# Patient Record
Sex: Female | Born: 1965 | Race: White | Hispanic: No | Marital: Married | Smoking: Never smoker
Health system: Southern US, Community
[De-identification: ages and names within clinical notes are randomized; demographics above are authoritative.]

## PROBLEM LIST (undated history)

## (undated) DIAGNOSIS — Z90710 Acquired absence of both cervix and uterus: Secondary | ICD-10-CM

## (undated) DIAGNOSIS — E039 Hypothyroidism, unspecified: Secondary | ICD-10-CM

## (undated) DIAGNOSIS — F419 Anxiety disorder, unspecified: Secondary | ICD-10-CM

## (undated) DIAGNOSIS — M3500 Sicca syndrome, unspecified: Secondary | ICD-10-CM

## (undated) DIAGNOSIS — M1990 Unspecified osteoarthritis, unspecified site: Secondary | ICD-10-CM

## (undated) HISTORY — DX: Unspecified osteoarthritis, unspecified site: M19.90

## (undated) HISTORY — DX: Anxiety disorder, unspecified: F41.9

## (undated) HISTORY — DX: Hypothyroidism, unspecified: E03.9

## (undated) HISTORY — DX: Sjogren syndrome, unspecified: M35.00

## (undated) HISTORY — PX: ABDOMINAL HYSTERECTOMY: SHX81

## (undated) HISTORY — PX: EYE SURGERY: SHX253

## (undated) HISTORY — DX: Acquired absence of both cervix and uterus: Z90.710

---

## 2015-01-18 ENCOUNTER — Emergency Department
Admission: EM | Admit: 2015-01-18 | Discharge: 2015-01-18 | Disposition: A | Discharge status: Home / Self Care | Payer: BC Managed Care – PPO | Source: Home / Self Care | Attending: Family Medicine | Admitting: Family Medicine

## 2015-01-18 ENCOUNTER — Emergency Department (INDEPENDENT_AMBULATORY_CARE_PROVIDER_SITE_OTHER): Payer: BC Managed Care – PPO

## 2015-01-18 ENCOUNTER — Encounter: Payer: Self-pay | Admitting: *Deleted

## 2015-01-18 DIAGNOSIS — G8929 Other chronic pain: Secondary | ICD-10-CM

## 2015-01-18 DIAGNOSIS — R079 Chest pain, unspecified: Secondary | ICD-10-CM

## 2015-01-18 DIAGNOSIS — R1011 Right upper quadrant pain: Secondary | ICD-10-CM

## 2015-01-18 DIAGNOSIS — M549 Dorsalgia, unspecified: Secondary | ICD-10-CM

## 2015-01-18 LAB — COMPLETE METABOLIC PANEL WITH GFR
ALT: 21 U/L (ref 6–29)
AST: 18 U/L (ref 10–35)
Albumin: 4.2 g/dL (ref 3.6–5.1)
Alkaline Phosphatase: 48 U/L (ref 33–115)
BUN: 8 mg/dL (ref 7–25)
CO2: 26 mmol/L (ref 20–31)
Calcium: 8.9 mg/dL (ref 8.6–10.2)
Chloride: 105 mmol/L (ref 98–110)
Creat: 0.9 mg/dL (ref 0.50–1.10)
GFR, Est African American: 88 mL/min (ref >=60)
GFR, Est Non African American: 76 mL/min (ref >=60)
Glucose, Bld: 106 mg/dL — ABNORMAL HIGH (ref 65–99)
Potassium: 3.7 mmol/L (ref 3.5–5.3)
Sodium: 142 mmol/L (ref 135–146)
Total Bilirubin: 0.5 mg/dL (ref 0.2–1.2)
Total Protein: 7.6 g/dL (ref 6.1–8.1)

## 2015-01-18 LAB — TSH: TSH: 18.16 u[IU]/mL — ABNORMAL HIGH (ref 0.350–4.500)

## 2015-01-18 NOTE — ED Provider Notes (Signed)
CSN: [id number]     Arrival date & time 01/18/15  1015 History   First MD Initiated Contact with Patient 01/18/15 1029     Chief Complaint  Patient presents with  . 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  Pt describes pain as aching and sore, mild to moderate at times but denies pain at this time.  Pt also notes she had a "tight" pain in her Right upper abdomen that lasted about 45 minutes the other day.  Pt notes she had this RUQ pain and upper chest/shoulder pain when she was startled by a car alarm going off. Symptoms do not appear to be better or worse with eating.  Denies shortness of breath.  Pt states walking to and from her car does not cause fatigue but if she spends the day doing chores at home she feels "worn out."  Denies personal or family hx of CAD. Denies hx of blood clots, leg pain or leg swelling.  She takes aspirin occasionally for the back and chest pain, which does help, but denies taking any daily medications. She does not have a PCP.    History reviewed. No pertinent past medical history. Past Surgical History  Procedure Laterality Date  . Eye surgery     Family History  Problem Relation Age of Onset  . Diabetes Father   . Cancer Father     prostate CA   Social History  Substance Use Topics  . Smoking status: Never Smoker   . Smokeless tobacco: Never Used  . Alcohol Use: Yes   OB History    No data available     Review of Systems  Constitutional: Positive for fatigue. Negative for fever, chills and diaphoresis.  HENT: Negative for congestion, ear pain, sore throat, trouble swallowing and voice change.   Respiratory: Negative for cough and shortness of breath.   Cardiovascular: Positive for chest pain. Negative for  palpitations.  Gastrointestinal: Positive for abdominal pain (RUQ). Negative for nausea, vomiting and diarrhea.  Musculoskeletal: Positive for myalgias and back pain ( upper, bilateral). Negative for arthralgias.  Skin: Negative for rash.  Neurological: Positive for light-headedness. Negative for headaches.  All other systems reviewed and are negative.   Allergies  Review of patient's allergies indicates no known allergies.  Home Medications   Prior to Admission medications   Not on File   Meds Ordered and Administered this Visit  Medications - No data to display  BP 154/90 mmHg  Pulse 99  Resp 16  Wt 186 lb (84.369 kg)  SpO2 99%  LMP 01/02/2015 No data found.   Physical Exam  Constitutional: She appears well-developed and well-nourished. No distress.  HENT:  Head: Normocephalic and atraumatic.  Eyes: Conjunctivae are normal. No scleral icterus.  Neck: Normal range of motion. Neck supple.  Cardiovascular: Normal rate, regular rhythm and normal heart sounds.   Pulmonary/Chest: Effort normal and breath sounds normal. No respiratory distress. She has no wheezes. She has no rales. She exhibits no tenderness.  Abdominal: Soft. She exhibits no distension and no mass. There is no tenderness. There is no rebound and no guarding.  Musculoskeletal: Normal range of motion.  Neurological: She is alert.  Skin: Skin is warm and dry. She is not diaphoretic.  Nursing note and vitals reviewed.  ED Course  Procedures (including critical care time)  Labs Review Labs Reviewed  COMPLETE METABOLIC PANEL WITH GFR  TSH  POCT CBC W AUTO DIFF ([facility])    Imaging Review Dg Chest 2 View  01/18/2015  CLINICAL DATA:  Upper chest pain. EXAM: CHEST  2 VIEW COMPARISON:  None. FINDINGS: Normal heart size. Normal mediastinal contour. No pneumothorax. No pleural effusion. Clear lungs, with no focal lung consolidation and no pulmonary edema. Mild degenerative changes in the thoracic  spine. IMPRESSION: No active disease in the chest. Electronically Signed   By: Ilona Sorrel M.D.   On: 01/18/2015 11:51   EKG: RSR (V1) non-diagnostic, otherwise normal. No old to compare.    MDM   1. Chest pain, unspecified chest pain type   2. Upper back pain   3. Abdominal pain, chronic, right upper quadrant     Pt c/o intermittent upper chest pain, back pain, and RUQ abdominal pain for 6 weeks. No pain or SOB at this time.  Pain not reproducible on exam.  Pt appears well, non-toxic, Vitals: WNL. O2 Sat 99%   CXR: normal Pt denies FH or personal hx of CAD.  Doubt ACS at this time, however, recommend pt establish care with a PCP as she does not currently have one.  Due to c/o RUQ pain as well as symptom onset the other day after being startled by a car alarm, will get CMP to check LFTs as well as TSH as symptoms may be due to anxiety.   Discussed symptoms that warrant emergent care in the ED. Patient verbalized understanding and agreement with treatment plan.    Noland Fordyce, PA-C 01/18/15 1322

## 2015-01-18 NOTE — ED Notes (Signed)
Pt c/o 6 weeks of intermittent CP that sometimes moves to her back. No current pain. 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

## 2015-01-19 ENCOUNTER — Telehealth: Payer: Self-pay | Admitting: *Deleted

## 2015-01-26 ENCOUNTER — Ambulatory Visit (INDEPENDENT_AMBULATORY_CARE_PROVIDER_SITE_OTHER): Payer: BC Managed Care – PPO | Admitting: Physician Assistant

## 2015-01-26 ENCOUNTER — Encounter: Payer: Self-pay | Admitting: Physician Assistant

## 2015-01-26 VITALS — BP 149/62 | HR 85 | Ht 64.5 in | Wt 186.0 lb

## 2015-01-26 DIAGNOSIS — Z13220 Encounter for screening for lipoid disorders: Secondary | ICD-10-CM

## 2015-01-26 DIAGNOSIS — F411 Generalized anxiety disorder: Secondary | ICD-10-CM | POA: Diagnosis not present

## 2015-01-26 DIAGNOSIS — R030 Elevated blood-pressure reading, without diagnosis of hypertension: Secondary | ICD-10-CM | POA: Diagnosis not present

## 2015-01-26 DIAGNOSIS — E039 Hypothyroidism, unspecified: Secondary | ICD-10-CM | POA: Diagnosis not present

## 2015-01-26 DIAGNOSIS — IMO0001 Reserved for inherently not codable concepts without codable children: Secondary | ICD-10-CM

## 2015-01-26 DIAGNOSIS — R0789 Other chest pain: Secondary | ICD-10-CM

## 2015-01-26 MED ORDER — LEVOTHYROXINE SODIUM 100 MCG PO TABS: 100.0000 ug | ORAL_TABLET | Freq: Every day | ORAL | Status: DC

## 2015-01-26 NOTE — Progress Notes (Signed)
   Subjective:    Patient ID: Heidi Carney, female    DOB: [birth year], 49 y.o.   MRN: [id number]  HPI Patient is a 49 year old female who presents to the clinic to establish care. She was recently seen in urgent care(01/17/14) for atypical chest pain. Her thyroid levels were checked and were elevated at greater than 18. EKG was normal. They did not suspect cardiac causes. She does feel like some of her symptoms worsen with exertion. She has a lot of upper chest tightness that radiates into her left mid back. She denies any musculoskeletal injury or trauma. She denies any association with food. She denies any nausea or vomiting. She has had some occasional indigestion but not related to her chest pain. She also mentions increased anxiety at work. She has never taken any medication for anxiety. She denies any suicidal or homicidal thoughts. She feels like she has worse agitation that actual anxiety. She does feel like taking aspirin makes her chest symptoms improve. She denies any shortness of breath.   .. Family History  Problem Relation Age of Onset  . Diabetes Father   . Cancer Father     prostate CA  . Stroke Father    .Marland Kitchen Social History   Social History  . Marital Status: Married    Spouse Name: N/A  . Number of Children: N/A  . Years of Education: N/A   Occupational History  . Not on file.   Social History Main Topics  . Smoking status: Never Smoker   . Smokeless tobacco: Never Used  . Alcohol Use: Yes  . Drug Use: No  . Sexual Activity: Yes   Other Topics Concern  . Not on file   Social History Narrative      Review of Systems  All other systems reviewed and are negative.      Objective:   Physical Exam  Constitutional: She is oriented to person, place, and time. She appears well-developed and well-nourished.  HENT:  Head: Normocephalic and atraumatic.  Cardiovascular: Normal rate, regular rhythm and normal heart sounds.   Pulmonary/Chest: Effort normal and  breath sounds normal.  Neurological: She is alert and oriented to person, place, and time.  Psychiatric: She has a normal mood and affect. Her behavior is normal.          Assessment & Plan:  Atypical chest pain- cardiac workup at urgent care was normal. EKG looked great. Will further evaluate with stress tests. Continue on daily ASA.   Hypothyroidism-started levothyroxin 100 g discussed how to properly take 30 minutes before eating and drinking in the morning. We'll recheck levels in 4 weeks.  GAD- GAD was 7(mild anxiety). Discussed could be from hypothyroidism. Will hold on treatment.   Elevated BP- patient does not have a history of hypertension. She reports taking her blood pressure at home and ranging in the 120s over 80s. Will hold off on starting a blood pressure medication today and recheck in 4 weeks. If continues to stay elevated will consider medication.  Will check lipid panel.  Pt declined flu shot today.

## 2015-01-28 ENCOUNTER — Telehealth: Payer: Self-pay | Admitting: Physician Assistant

## 2015-01-28 NOTE — Telephone Encounter (Signed)
Pt called and states she started taking her synthroid Rx and has felt "extremely fatigued." Pt states she had to "stop and sit down multiples times yesterday" and questions if this is "normal." Will route to PCP for review.

## 2015-01-28 NOTE — Telephone Encounter (Signed)
Pt advised of recommendation and verbalized understanding.

## 2015-01-28 NOTE — Telephone Encounter (Signed)
No not a normal reaction. Stop and see if symptoms improve. Once improved cut tablet in half if she can and start there. If cannot cut tablet in half let me know.

## 2015-02-11 ENCOUNTER — Other Ambulatory Visit: Payer: Self-pay | Admitting: Physician Assistant

## 2015-02-11 ENCOUNTER — Ambulatory Visit (INDEPENDENT_AMBULATORY_CARE_PROVIDER_SITE_OTHER): Payer: BC Managed Care – PPO

## 2015-02-11 ENCOUNTER — Encounter: Payer: BC Managed Care – PPO | Admitting: Physician Assistant

## 2015-02-11 DIAGNOSIS — R0789 Other chest pain: Secondary | ICD-10-CM

## 2015-02-11 DIAGNOSIS — R079 Chest pain, unspecified: Secondary | ICD-10-CM

## 2015-02-11 LAB — EXERCISE TOLERANCE TEST
CHL CUP MPHR: 173 {beats}/min
CSEPEW: 7 METS
CSEPHR: 92 %
Exercise duration (min): 6 min
Exercise duration (sec): 0 s
Peak HR: 160 {beats}/min
RPE: 14
Rest HR: 94 {beats}/min

## 2015-02-12 ENCOUNTER — Telehealth: Payer: Self-pay | Admitting: *Deleted

## 2015-02-12 NOTE — Telephone Encounter (Signed)
Pt left vm stating that she had an abnormal result on a stress test yesterday & they are having her come back to do a nuclear stress test.  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  Please advise.

## 2015-02-12 NOTE — Telephone Encounter (Signed)
To write out of work need office visit to document so insurance will pay. Come in Monday and can give note and document reasons why.

## 2015-02-15 NOTE — Telephone Encounter (Signed)
Yes should be able to see in care everywhere.

## 2015-02-15 NOTE — Telephone Encounter (Signed)
Spoke with pt this morning and she ended up going to the ED sat with elbow pain.  They admitted her & are going to do the nuclear stress test today.  She asked if it would be ok for them to draw her tsh levels while she was there so I told her that wouldn't be a problem as long as we got the results.  They may have to send something for you to sign for authorization.

## 2015-02-16 ENCOUNTER — Telehealth (HOSPITAL_COMMUNITY): Payer: Self-pay | Admitting: *Deleted

## 2015-02-17 ENCOUNTER — Encounter: Payer: Self-pay | Admitting: Physician Assistant

## 2015-02-17 ENCOUNTER — Ambulatory Visit (INDEPENDENT_AMBULATORY_CARE_PROVIDER_SITE_OTHER): Payer: BC Managed Care – PPO | Admitting: Physician Assistant

## 2015-02-17 VITALS — BP 130/80 | HR 101 | Ht 64.5 in | Wt 187.0 lb

## 2015-02-17 DIAGNOSIS — E039 Hypothyroidism, unspecified: Secondary | ICD-10-CM

## 2015-02-17 DIAGNOSIS — Z13220 Encounter for screening for lipoid disorders: Secondary | ICD-10-CM | POA: Diagnosis not present

## 2015-02-17 DIAGNOSIS — F411 Generalized anxiety disorder: Secondary | ICD-10-CM

## 2015-02-17 DIAGNOSIS — Z131 Encounter for screening for diabetes mellitus: Secondary | ICD-10-CM

## 2015-02-17 DIAGNOSIS — R0789 Other chest pain: Secondary | ICD-10-CM

## 2015-02-17 DIAGNOSIS — T50905A Adverse effect of unspecified drugs, medicaments and biological substances, initial encounter: Secondary | ICD-10-CM

## 2015-02-17 MED ORDER — CITALOPRAM HYDROBROMIDE 10 MG PO TABS: 10.0000 mg | ORAL_TABLET | Freq: Every day | ORAL | Status: DC

## 2015-02-17 NOTE — Progress Notes (Signed)
   Subjective:    Patient ID: Heidi Carney, female    DOB: Oct 29, 1965, 49 y.o.   MRN: [id number]  HPI  Patient is a 49 year old female who presents to the clinic to follow up after ER visit on 02/14/15. She went with complaints of left arm numbness and pain that radiated to jaw and chest tightness. Exercise stress test was abnormal but followed up with completely normal nuclear stress test. Negative cardiac enzymes. She now feels much better knowing it is not her heart and symptoms have began to improve. She now notices when she starts to feel more anxious or stress she gets similar symptoms. She admits to feeling anxious a lot and little things getting her. Does not feel hopeless or helpless.  She is ready to start a medication. TSH in ED was 9/9. She is taking 20mcg because 14mcg per pt made her feel very sleepy.      Review of Systems  All other systems reviewed and are negative.      Objective:   Physical Exam  Constitutional: She is oriented to person, place, and time. She appears well-developed and well-nourished.  HENT:  Head: Normocephalic and atraumatic.  Cardiovascular: Normal rate, regular rhythm and normal heart sounds.   Pulmonary/Chest: Effort normal and breath sounds normal. She has no wheezes.  Neurological: She is alert and oriented to person, place, and time.  Psychiatric: She has a normal mood and affect. Her behavior is normal.          Assessment & Plan:  ayptical chest pain/elevated BP- reassured with normal nuclear stress test. Will try to manage anxiety and see if CP improves. Needs lipid. Given lab slip to have done. BP elevated on arrival improved with sitting.   GAD- GAD-7 was 18. Will start celexa 10mg  daily. Discussed side effects. Pt aware takes some time to get in system. Follow up in 4 weeks.    Hypothyroidism- looking better at 9.9. Increase to 61mcg and will recheck in 6 weeks.   Written out of work until next Wednesday. Hopefully celexa will  be in system and some of her CP symptoms will improve.

## 2015-02-22 ENCOUNTER — Other Ambulatory Visit: Payer: Self-pay | Admitting: Physician Assistant

## 2015-02-22 ENCOUNTER — Ambulatory Visit: Payer: BC Managed Care – PPO | Admitting: Physician Assistant

## 2015-03-01 ENCOUNTER — Encounter (HOSPITAL_COMMUNITY): Payer: BC Managed Care – PPO

## 2015-03-01 ENCOUNTER — Other Ambulatory Visit: Payer: Self-pay | Admitting: *Deleted

## 2015-03-01 ENCOUNTER — Telehealth: Payer: Self-pay | Admitting: *Deleted

## 2015-03-01 MED ORDER — LEVOTHYROXINE SODIUM 75 MCG PO TABS: 75.0000 ug | ORAL_TABLET | Freq: Every day | ORAL | Status: DC

## 2015-03-01 NOTE — Telephone Encounter (Signed)
58mcg is correct dose. Please make correction and send.

## 2015-03-01 NOTE — Telephone Encounter (Signed)
Pt left vm stating that you were changing her levothyroxine to 64mcg but 12mcg was sent in & filled.  Please advise on the correct dose.

## 2015-03-01 NOTE — Telephone Encounter (Signed)
Levothyroxine 54mcg sent to pharm.  Pt notified.

## 2015-03-10 ENCOUNTER — Ambulatory Visit (INDEPENDENT_AMBULATORY_CARE_PROVIDER_SITE_OTHER): Payer: BC Managed Care – PPO | Admitting: Physician Assistant

## 2015-03-10 ENCOUNTER — Encounter: Payer: Self-pay | Admitting: Physician Assistant

## 2015-03-10 VITALS — BP 143/71 | HR 96 | Ht 64.5 in | Wt 186.0 lb

## 2015-03-10 DIAGNOSIS — L219 Seborrheic dermatitis, unspecified: Secondary | ICD-10-CM

## 2015-03-10 DIAGNOSIS — H6012 Cellulitis of left external ear: Secondary | ICD-10-CM

## 2015-03-10 MED ORDER — DOXYCYCLINE HYCLATE 100 MG PO TABS: 100.0000 mg | ORAL_TABLET | Freq: Two times a day (BID) | ORAL | Status: DC

## 2015-03-10 MED ORDER — TRIAMCINOLONE ACETONIDE 0.5 % EX OINT: 1.0000 "application " | TOPICAL_OINTMENT | Freq: Two times a day (BID) | CUTANEOUS | Status: DC

## 2015-03-10 NOTE — Progress Notes (Signed)
   Subjective:    Patient ID: Heidi Carney, female    DOB: 08-Sep-1965, 49 y.o.   MRN: [id number]  HPI Pt presents to the clinic for red, warm, tender left ear for last 2 days. She remembers scratching the back of her ear because it was itching and scaly. She has done nothing to make better. Nothing makes worse. No fever, chills, n/v/d.    Review of Systems  All other systems reviewed and are negative.      Objective:   Physical Exam  Constitutional: She appears well-developed and well-nourished.  HENT:  Head:            Assessment & Plan:  Cellulitis of left ear- likely bacteria entered through scratch in the crease behind ear. Treated with doxycycline for 10 days. Keep clean and dry. Follow up as needed.   seborrheic dermatitis- triamcinolone cream to use behind ear.

## 2015-03-13 LAB — COMPLETE METABOLIC PANEL WITH GFR
ALT: 22 U/L (ref 6–29)
AST: 21 U/L (ref 10–35)
Albumin: 4.1 g/dL (ref 3.6–5.1)
Alkaline Phosphatase: 53 U/L (ref 33–115)
BILIRUBIN TOTAL: 0.6 mg/dL (ref 0.2–1.2)
BUN: 12 mg/dL (ref 7–25)
CO2: 31 mmol/L (ref 20–31)
CREATININE: 0.9 mg/dL (ref 0.50–1.10)
Calcium: 8.8 mg/dL (ref 8.6–10.2)
Chloride: 102 mmol/L (ref 98–110)
GFR, Est African American: 88 mL/min (ref >=60)
GFR, Est Non African American: 76 mL/min (ref >=60)
GLUCOSE: 90 mg/dL (ref 65–99)
Potassium: 4.1 mmol/L (ref 3.5–5.3)
SODIUM: 139 mmol/L (ref 135–146)
TOTAL PROTEIN: 7.3 g/dL (ref 6.1–8.1)

## 2015-03-13 LAB — LIPID PANEL
Cholesterol: 152 mg/dL (ref 125–200)
HDL: 48 mg/dL (ref >=46)
LDL Cholesterol: 81 mg/dL (ref <130)
Total CHOL/HDL Ratio: 3.2 Ratio (ref <=5.0)
Triglycerides: 117 mg/dL (ref <150)
VLDL: 23 mg/dL (ref <30)

## 2015-03-13 LAB — TSH: TSH: 4.408 u[IU]/mL (ref 0.350–4.500)

## 2015-03-17 ENCOUNTER — Encounter: Payer: Self-pay | Admitting: Physician Assistant

## 2015-03-17 ENCOUNTER — Ambulatory Visit (INDEPENDENT_AMBULATORY_CARE_PROVIDER_SITE_OTHER): Payer: BC Managed Care – PPO | Admitting: Physician Assistant

## 2015-03-17 VITALS — BP 132/82 | HR 97 | Ht 64.5 in | Wt 186.0 lb

## 2015-03-17 DIAGNOSIS — E039 Hypothyroidism, unspecified: Secondary | ICD-10-CM

## 2015-03-17 DIAGNOSIS — L219 Seborrheic dermatitis, unspecified: Secondary | ICD-10-CM | POA: Diagnosis not present

## 2015-03-17 DIAGNOSIS — Z23 Encounter for immunization: Secondary | ICD-10-CM | POA: Diagnosis not present

## 2015-03-17 DIAGNOSIS — R030 Elevated blood-pressure reading, without diagnosis of hypertension: Secondary | ICD-10-CM | POA: Diagnosis not present

## 2015-03-17 DIAGNOSIS — E663 Overweight: Secondary | ICD-10-CM

## 2015-03-17 DIAGNOSIS — IMO0001 Reserved for inherently not codable concepts without codable children: Secondary | ICD-10-CM

## 2015-03-17 DIAGNOSIS — F411 Generalized anxiety disorder: Secondary | ICD-10-CM

## 2015-03-17 NOTE — Progress Notes (Signed)
   Subjective:    Patient ID: Heidi Carney, female    DOB: 30-Jul-1965, 49 y.o.   MRN: [id number]  HPI  Patient is a 49 year old female who presents to the clinic for follow-up.  Hypothyroidism-she would like to discuss her labs and talk about any medication changes. She is feeling much better over the last couple weeks. Her back spasms have decreased, her anxiety has decreased, she has more energy.  Generalized anxiety disorder-she did not like the way she was feeling on Celexa. She does feel overall that her anxiety has improved since starting the levothyroxine. She denies any suicidal or homicidal thoughts.  Seborrheic dermatitis-the rash behind ears and around scalp line has completely resolved with triamcinolone.  Elevated blood pressure- She is not checking her blood pressure at home. She denies any chest pains, palpitations, headaches, dizziness or vision changes. She is currently on no medication to treat.    Review of Systems  All other systems reviewed and are negative.      Objective:   Physical Exam  Constitutional: She is oriented to person, place, and time. She appears well-developed and well-nourished.  HENT:  Head: Normocephalic and atraumatic.  Cardiovascular: Normal rate, regular rhythm and normal heart sounds.   Pulmonary/Chest: Effort normal and breath sounds normal.  Neurological: She is alert and oriented to person, place, and time.  Psychiatric: She has a normal mood and affect. Her behavior is normal.          Assessment & Plan:  Hypothyroidism-last thyroid was checked earlier this month and was 4.4. This is the upper limits of normal. I discussed this with patient. She is concerned because she originally had some side effects from the level thyroxine. She would like to stay on the 75 g a least until she runs out. She will then call and ask for an increase to 53mcg daily. After she is increase for at least 4-6 weeks we will recheck her thyroid level  again and adjust accordingly. She does feel like getting this number in the normal range has significantly improved her symptoms and she feels better.   Generalized anxiety disorder-we had originally started Celexa. She did not like the way she felt on Celexa. She stopped. Since her thyroid levels have normal lie she feels like her anxiety has decreased. I did discuss with her exercise and relaxation techniques that can help with anxiety and depression. I also encouraged her to check out natural stress really formulas that can also help with some septal anxiety.  Seborrheic dermatitis- resolved with triamcinolone. Patient showed to do use as needed.  Elevated blood pressure/overweight-rechecked and was 132/82. I discussed with patient this is borderline. I encouraged her to keep a low salt diet. The DASH diet was given to her in a handout. Also encouraged weight loss. I discussed medications that could help with weight loss. She would like to try this on her own first.    Flu shot given today.

## 2015-03-17 NOTE — Patient Instructions (Signed)
DASH Eating Plan  DASH stands for "Dietary Approaches to Stop Hypertension." The DASH eating plan is a healthy eating plan that has been shown to reduce high blood pressure (hypertension). Additional health benefits may include reducing the risk of type 2 diabetes mellitus, heart disease, and stroke. The DASH eating plan may also help with weight loss.  WHAT DO I NEED TO KNOW ABOUT THE DASH EATING PLAN?  For the DASH eating plan, you will follow these general guidelines:  · Choose foods with a percent daily value for sodium of less than 5% (as listed on the food label).  · Use salt-free seasonings or herbs instead of table salt or sea salt.  · Check with your health care provider or pharmacist before using salt substitutes.  · Eat lower-sodium products, often labeled as "lower sodium" or "no salt added."  · Eat fresh foods.  · Eat more vegetables, fruits, and low-fat dairy products.  · Choose whole grains. Look for the word "whole" as the first word in the ingredient list.  · Choose fish and skinless chicken or turkey more often than red meat. Limit fish, poultry, and meat to 6 oz (170 g) each day.  · Limit sweets, desserts, sugars, and sugary drinks.  · Choose heart-healthy fats.  · Limit cheese to 1 oz (28 g) per day.  · Eat more home-cooked food and less restaurant, buffet, and fast food.  · Limit fried foods.  · Cook foods using methods other than frying.  · Limit canned vegetables. If you do use them, rinse them well to decrease the sodium.  · When eating at a restaurant, ask that your food be prepared with less salt, or no salt if possible.  WHAT FOODS CAN I EAT?  Seek help from a dietitian for individual calorie needs.  Grains  Whole grain or whole wheat bread. Brown rice. Whole grain or whole wheat pasta. Quinoa, bulgur, and whole grain cereals. Low-sodium cereals. Corn or whole wheat flour tortillas. Whole grain cornbread. Whole grain crackers. Low-sodium crackers.  Vegetables  Fresh or frozen vegetables  (raw, steamed, roasted, or grilled). Low-sodium or reduced-sodium tomato and vegetable juices. Low-sodium or reduced-sodium tomato sauce and paste. Low-sodium or reduced-sodium canned vegetables.   Fruits  All fresh, canned (in natural juice), or frozen fruits.  Meat and Other Protein Products  Ground beef (85% or leaner), grass-fed beef, or beef trimmed of fat. Skinless chicken or turkey. Ground chicken or turkey. Pork trimmed of fat. All fish and seafood. Eggs. Dried beans, peas, or lentils. Unsalted nuts and seeds. Unsalted canned beans.  Dairy  Low-fat dairy products, such as skim or 1% milk, 2% or reduced-fat cheeses, low-fat ricotta or cottage cheese, or plain low-fat yogurt. Low-sodium or reduced-sodium cheeses.  Fats and Oils  Tub margarines without trans fats. Light or reduced-fat mayonnaise and salad dressings (reduced sodium). Avocado. Safflower, olive, or canola oils. Natural peanut or almond butter.  Other  Unsalted popcorn and pretzels.  The items listed above may not be a complete list of recommended foods or beverages. Contact your dietitian for more options.  WHAT FOODS ARE NOT RECOMMENDED?  Grains  White bread. White pasta. White rice. Refined cornbread. Bagels and croissants. Crackers that contain trans fat.  Vegetables  Creamed or fried vegetables. Vegetables in a cheese sauce. Regular canned vegetables. Regular canned tomato sauce and paste. Regular tomato and vegetable juices.  Fruits  Dried fruits. Canned fruit in light or heavy syrup. Fruit juice.  Meat and Other Protein   Products  Fatty cuts of meat. Ribs, chicken wings, bacon, sausage, bologna, salami, chitterlings, fatback, hot dogs, bratwurst, and packaged luncheon meats. Salted nuts and seeds. Canned beans with salt.  Dairy  Whole or 2% milk, cream, half-and-half, and cream cheese. Whole-fat or sweetened yogurt. Full-fat cheeses or blue cheese. Nondairy creamers and whipped toppings. Processed cheese, cheese spreads, or cheese  curds.  Condiments  Onion and garlic salt, seasoned salt, table salt, and sea salt. Canned and packaged gravies. Worcestershire sauce. Tartar sauce. Barbecue sauce. Teriyaki sauce. Soy sauce, including reduced sodium. Steak sauce. Fish sauce. Oyster sauce. Cocktail sauce. Horseradish. Ketchup and mustard. Meat flavorings and tenderizers. Bouillon cubes. Hot sauce. Tabasco sauce. Marinades. Taco seasonings. Relishes.  Fats and Oils  Butter, stick margarine, lard, shortening, ghee, and bacon fat. Coconut, palm kernel, or palm oils. Regular salad dressings.  Other  Pickles and olives. Salted popcorn and pretzels.  The items listed above may not be a complete list of foods and beverages to avoid. Contact your dietitian for more information.  WHERE CAN I FIND MORE INFORMATION?  National Heart, Lung, and Blood Institute: www.nhlbi.nih.gov/health/health-topics/topics/dash/     This information is not intended to replace advice given to you by your health care provider. Make sure you discuss any questions you have with your health care provider.     Document Released: 03/09/2011 Document Revised: 04/10/2014 Document Reviewed: 01/22/2013  Elsevier Interactive Patient Education ©2016 Elsevier Inc.

## 2015-03-31 ENCOUNTER — Other Ambulatory Visit: Payer: Self-pay | Admitting: *Deleted

## 2015-03-31 MED ORDER — LEVOTHYROXINE SODIUM 88 MCG PO TABS: 88.0000 ug | ORAL_TABLET | Freq: Every day | ORAL | Status: DC

## 2015-04-30 ENCOUNTER — Telehealth: Payer: Self-pay | Admitting: *Deleted

## 2015-04-30 DIAGNOSIS — E039 Hypothyroidism, unspecified: Secondary | ICD-10-CM

## 2015-04-30 NOTE — Telephone Encounter (Signed)
Labs ordered to recheck TSH. 

## 2015-05-05 LAB — TSH: TSH: 2.11 u[IU]/mL (ref 0.350–4.500)

## 2015-05-24 ENCOUNTER — Encounter: Payer: Self-pay | Admitting: Physician Assistant

## 2015-05-24 ENCOUNTER — Ambulatory Visit (INDEPENDENT_AMBULATORY_CARE_PROVIDER_SITE_OTHER): Payer: BC Managed Care – PPO | Admitting: Physician Assistant

## 2015-05-24 VITALS — BP 132/80 | HR 94 | Ht 65.5 in | Wt 181.0 lb

## 2015-05-24 DIAGNOSIS — F411 Generalized anxiety disorder: Secondary | ICD-10-CM

## 2015-05-24 DIAGNOSIS — E039 Hypothyroidism, unspecified: Secondary | ICD-10-CM

## 2015-05-24 MED ORDER — LORAZEPAM 0.5 MG PO TABS: 0.5000 mg | ORAL_TABLET | Freq: Three times a day (TID) | ORAL | Status: DC | PRN

## 2015-05-24 MED ORDER — LEVOTHYROXINE SODIUM 88 MCG PO TABS: 88.0000 ug | ORAL_TABLET | Freq: Every day | ORAL | Status: DC

## 2015-05-24 MED ORDER — VENLAFAXINE HCL ER 37.5 MG PO CP24: 37.5000 mg | ORAL_CAPSULE | Freq: Every day | ORAL | Status: DC

## 2015-05-25 ENCOUNTER — Other Ambulatory Visit: Payer: Self-pay | Admitting: Physician Assistant

## 2015-05-26 NOTE — Progress Notes (Signed)
   Subjective:    Patient ID: Heidi Carney, female    DOB: [birth year], 50 y.o.   MRN: [id number]  HPI  Pt presents to the clinic for hypothyroidism follow up. Labs checked and doing good. She feels controlled and no complaints.   She does feel like her anxiety is worsening. She had tried celexa back in November 2016 and felt like made anxiety worse. She felt great is December but her job has become more stressful and she is having more panic attacks. She feels like she needs something for those attacks. She denies any exercise. No suicidal or homicidal thoughts.   Review of Systems  All other systems reviewed and are negative.      Objective:   Physical Exam  Constitutional: She is oriented to person, place, and time. She appears well-developed and well-nourished.  HENT:  Head: Normocephalic and atraumatic.  Cardiovascular: Normal rate, regular rhythm and normal heart sounds.   Pulmonary/Chest: Effort normal and breath sounds normal.  Neurological: She is alert and oriented to person, place, and time.  Psychiatric: She has a normal mood and affect. Her behavior is normal.          Assessment & Plan:  Hypothyroidism- TSH recheck and great. Sent refill for 6 months.   GAD- started effexor. Discussed side effects. Follow up in 4 weeks. Ativan as needed given. Discussed not to take daily but as needed.

## 2015-06-08 ENCOUNTER — Telehealth: Payer: Self-pay | Admitting: *Deleted

## 2015-06-08 NOTE — Telephone Encounter (Signed)
Pt left vm today stating that she's been taking the Effexor for about 10 days now & it's helping her anxiety a lot.  She stated that after about 7 days she started noticing some blurry vision.  She's had a recent eye exam and that was normal.  She wants to know if this is a normal side effect & should she stop taking it.  Please advise.

## 2015-06-09 NOTE — Telephone Encounter (Signed)
It is possible to cause blurry vision. It's definitely a more rare side effect. I would stop the medication. Has she taken Zoloft before. That works well for anxiety as well Paxil is also another choice. Let me know what she would like to do. Or if she would like to wait until status back to make a change then that is fine as well

## 2015-06-10 NOTE — Telephone Encounter (Signed)
[facility] notifying pt of recommendations. I requested a return call to let us now what she would like to do regarding medications.

## 2015-06-18 ENCOUNTER — Other Ambulatory Visit: Payer: Self-pay | Admitting: Physician Assistant

## 2015-06-18 MED ORDER — PAROXETINE HCL 10 MG PO TABS: 10.0000 mg | ORAL_TABLET | Freq: Every day | ORAL | Status: DC

## 2015-06-18 NOTE — Telephone Encounter (Signed)
Pt called back and is ok with trying Paxil.

## 2015-07-02 ENCOUNTER — Telehealth: Payer: Self-pay | Admitting: *Deleted

## 2015-07-02 NOTE — Telephone Encounter (Signed)
Pt called and states the paxil is not working and the Effexor did work better. She did see an eye doctor and he said that maybe once she got the effexor in her system the vison problem may go away. The patient was on the Effexor for two weeks. The patient would like to try the effexor again.

## 2015-07-02 NOTE — Telephone Encounter (Signed)
Ok to try effexor 37.5mg  once daily stay low dose for 4 weeks #30 RF1

## 2015-07-05 ENCOUNTER — Other Ambulatory Visit: Payer: Self-pay | Admitting: *Deleted

## 2015-07-05 MED ORDER — VENLAFAXINE HCL ER 37.5 MG PO CP24: 37.5000 mg | ORAL_CAPSULE | Freq: Every day | ORAL | Status: DC

## 2015-07-05 NOTE — Telephone Encounter (Signed)
Rx sent 

## 2015-08-29 ENCOUNTER — Other Ambulatory Visit: Payer: Self-pay | Admitting: Physician Assistant

## 2015-09-13 ENCOUNTER — Ambulatory Visit (INDEPENDENT_AMBULATORY_CARE_PROVIDER_SITE_OTHER): Payer: BC Managed Care – PPO | Admitting: Physician Assistant

## 2015-09-13 ENCOUNTER — Encounter: Payer: Self-pay | Admitting: Physician Assistant

## 2015-09-13 VITALS — BP 128/82 | HR 79 | Ht 65.5 in | Wt 174.0 lb

## 2015-09-13 DIAGNOSIS — N951 Menopausal and female climacteric states: Secondary | ICD-10-CM | POA: Insufficient documentation

## 2015-09-13 DIAGNOSIS — E039 Hypothyroidism, unspecified: Secondary | ICD-10-CM

## 2015-09-13 DIAGNOSIS — F411 Generalized anxiety disorder: Secondary | ICD-10-CM

## 2015-09-13 LAB — VITAMIN B12: Vitamin B-12: 316 pg/mL (ref 200–1100)

## 2015-09-13 LAB — TSH: TSH: 1.79 mIU/L

## 2015-09-13 MED ORDER — VENLAFAXINE HCL ER 75 MG PO CP24: 75.0000 mg | ORAL_CAPSULE | Freq: Every day | ORAL | Status: DC

## 2015-09-13 NOTE — Progress Notes (Signed)
   Subjective:    Patient ID: Heidi Carney, female    DOB: 09-Jun-1965, 50 y.o.   MRN: [id number]  HPI  Pt is a 50 yo female who presents to the clinic for 6 month follow up.   She has anxiety and taking effexor. Her anxiety is much improved. She originally had some side effects with vision with effexor. No side effects this time.   She has hypothyroidism. Taking medications daily.   She is having some episodes of hot flashes that create flushing. At times she even has some chest tightness and tingling in hands and fingers. Still having periods but irregular. These sensation come and go but she does get anxious when they occur. Full cardaic work up in November 2016.   Review of Systems  All other systems reviewed and are negative.      Objective:   Physical Exam  Constitutional: She is oriented to person, place, and time. She appears well-developed and well-nourished.  HENT:  Head: Normocephalic and atraumatic.  Cardiovascular: Normal rate, regular rhythm and normal heart sounds.   Pulmonary/Chest: Effort normal and breath sounds normal.  Neurological: She is alert and oriented to person, place, and time.  Skin: Skin is dry.  Psychiatric: She has a normal mood and affect. Her behavior is normal.          Assessment & Plan:  GAD- GAD-7 was 3. Doing great on effexor. Did increase today to see if would also help with peri-menopausal symptoms.   Hypothyroidism- recheck TSH and adjust accordingly.   Peri-menopausal symptoms- reassured patient that her symtpoms seem consisent with anxiety around hot flashes. discussed HRT.pt does not want to start this. Discussed would need to check [facility] as well. She declined. Increased effexor to see if could help with some of symptoms. Follow up in 6 months. b12 and vitamin D ordered.

## 2015-09-14 LAB — VITAMIN D 25 HYDROXY (VIT D DEFICIENCY, FRACTURES): VIT D 25 HYDROXY: 27 ng/mL — AB (ref 30–100)

## 2015-09-16 ENCOUNTER — Telehealth: Payer: Self-pay

## 2015-09-17 MED ORDER — CYANOCOBALAMIN 1000 MCG/ML IJ SOLN: 1000.0000 ug | Freq: Once | INTRAMUSCULAR | Status: DC

## 2015-09-17 NOTE — Telephone Encounter (Signed)
Left VM for patient that Jade OK ed injections.  Medication sent to pharmacy.

## 2015-09-17 NOTE — Telephone Encounter (Signed)
Ok to do injections monthy. Recheck B12 in 4 months.

## 2015-10-14 ENCOUNTER — Other Ambulatory Visit: Payer: Self-pay | Admitting: Physician Assistant

## 2015-11-10 ENCOUNTER — Other Ambulatory Visit: Payer: Self-pay | Admitting: *Deleted

## 2015-11-10 MED ORDER — LEVOTHYROXINE SODIUM 88 MCG PO TABS: ORAL_TABLET | ORAL | 0 refills | Status: DC

## 2015-11-16 ENCOUNTER — Ambulatory Visit (INDEPENDENT_AMBULATORY_CARE_PROVIDER_SITE_OTHER): Payer: BC Managed Care – PPO | Admitting: Physician Assistant

## 2015-11-16 ENCOUNTER — Encounter: Payer: Self-pay | Admitting: Physician Assistant

## 2015-11-16 VITALS — BP 136/69 | HR 90 | Wt 174.0 lb

## 2015-11-16 DIAGNOSIS — G2581 Restless legs syndrome: Secondary | ICD-10-CM | POA: Insufficient documentation

## 2015-11-16 DIAGNOSIS — E538 Deficiency of other specified B group vitamins: Secondary | ICD-10-CM | POA: Insufficient documentation

## 2015-11-16 DIAGNOSIS — L659 Nonscarring hair loss, unspecified: Secondary | ICD-10-CM

## 2015-11-16 DIAGNOSIS — E559 Vitamin D deficiency, unspecified: Secondary | ICD-10-CM | POA: Insufficient documentation

## 2015-11-16 DIAGNOSIS — E039 Hypothyroidism, unspecified: Secondary | ICD-10-CM | POA: Diagnosis not present

## 2015-11-16 MED ORDER — CYANOCOBALAMIN 1000 MCG/ML IJ SOLN: 1000.0000 ug | INTRAMUSCULAR | 5 refills | Status: DC

## 2015-11-16 MED ORDER — CLOBETASOL PROPIONATE 0.05 % EX FOAM: Freq: Two times a day (BID) | CUTANEOUS | 2 refills | Status: DC

## 2015-11-16 NOTE — Patient Instructions (Addendum)
325mg  daily of iron.  Biotin vitamins for hair and nail growth.  Consider Rogaine can get in prescription if needed.    Seborrheic Dermatitis Seborrheic dermatitis involves pink or red skin with greasy, flaky scales. It usually occurs on the scalp, and it is often called dandruff. This condition may also affect the eyebrows, nose, ears, chest, and the bearded area of men's faces. It often occurs where skin has more oil (sebaceous) glands. It may come and go for no known reason, and it is often long-lasting (chronic). CAUSES The cause is not known. RISK FACTORS This condition is more like to develop in:  People who are stressed or tired.  People who have skin conditions, such as acne.  People who have certain conditions, such as:  HIV (human immunodeficiency virus).  AIDS (acquired immunodeficiency syndrome).  Parkinson disease.  An eating disorder.  Stroke.  Depression.  Epilepsy.  Alcoholism.  People who live in places that have extreme weather.  People who have a family history of seborrheic dermatitis.  People who use skin creams that are made with alcohol.  People who are 81-61 years old.  People who take certain medicines. SYMPTOMS Symptoms of this condition include:  Thick scales on the scalp.  Redness on the face or in the armpits.  Skin that is flaky. The flakes may be white or yellow.  Skin that seems oily or dry but is not helped with moisturizers.  Itching or burning in the affected areas. DIAGNOSIS This condition is diagnosed with a medical history and physical exam. A sample of your skin may be tested (skin biopsy). You may need to see a skin specialist (dermatologist). TREATMENT There is no cure for this condition, but treatment can help to manage the symptoms. Treatment may include:  Cortisone (steroid) ointments, creams, and lotions.  Over-the-counter or prescription shampoos. HOME CARE INSTRUCTIONS  Apply over-the-counter and  prescription medicines only as told by your health care provider.  Keep all follow-up visits as told by your health care provider. This is important.  Try to reduce your stress, such as with yoga or mediation. If you need help to reduce stress, ask your health care provider.  Shower or bathe as told by your health care provider.  Use any medicated shampoos as told by your health care provider. SEEK MEDICAL CARE IF:  Your symptoms do not improve with treatment.  Your symptoms get worse.  You have new symptoms.   This information is not intended to replace advice given to you by your health care provider. Make sure you discuss any questions you have with your health care provider.   Document Released: 03/20/2005 Document Revised: 12/09/2014 Document Reviewed: 08/05/2014 Elsevier Interactive Patient Education 2016 Elsevier Inc.   Restless Legs Syndrome Restless legs syndrome is a condition that causes uncomfortable feelings or sensations in the legs, especially while sitting or lying down. The sensations usually cause an overwhelming urge to move the legs. The arms can also sometimes be affected. The condition can range from mild to severe. The symptoms often interfere with a person's ability to sleep. CAUSES The cause of this condition is not known. RISK FACTORS This condition is more likely to develop in:  People who are older than age 2.  Pregnant women. In general, restless legs syndrome is more common in women than in men.  People who have a family history of the condition.  People who have certain medical conditions, such as iron deficiency, kidney disease, Parkinson disease, or nerve damage.  People who take certain medicines, such as medicines for high blood pressure, nausea, colds, allergies, depression, and some heart conditions. SYMPTOMS The main symptom of this condition is uncomfortable sensations in the legs. These sensations may be:  Described as pulling,  tingling, prickling, throbbing, crawling, or burning.  Worse while you are sitting or lying down.  Worse during periods of rest or inactivity.  Worse at night, often interfering with your sleep.  Accompanied by a very strong urge to move your legs.  Temporarily relieved by movement of your legs. The sensations usually affect both sides of the body. The arms can also be affected, but this is rare. People who have this condition often have tiredness during the day because of their lack of sleep at night. DIAGNOSIS This condition may be diagnosed based on your description of the symptoms. You may also have tests, including blood tests, to check for other conditions that may lead to your symptoms. In some cases, you may be asked to spend some time in a sleep lab so your sleeping can be monitored. TREATMENT Treatment for this condition is focused on managing the symptoms. Treatment may include:  Self-help and lifestyle changes.  Medicines. HOME CARE INSTRUCTIONS  Take medicines only as directed by your health care provider.  Try these methods to get temporary relief from the uncomfortable sensations:  Massage your legs.  Walk or stretch.  Take a cold or hot bath.  Practice good sleep habits. For example, go to bed and get up at the same time every day.  Exercise regularly.  Practice ways of relaxing, such as yoga or meditation.  Avoid caffeine and alcohol.  Do not use any tobacco products, including cigarettes, chewing tobacco, or electronic cigarettes. If you need help quitting, ask your health care provider.  Keep all follow-up visits as directed by your health care provider. This is important. SEEK MEDICAL CARE IF: Your symptoms do not improve with treatment, or they get worse.   This information is not intended to replace advice given to you by your health care provider. Make sure you discuss any questions you have with your health care provider.   Document Released:  03/10/2002 Document Revised: 08/04/2014 Document Reviewed: 03/16/2014 Elsevier Interactive Patient Education [employer].

## 2015-11-16 NOTE — Progress Notes (Signed)
   Subjective:    Patient ID: Heidi Carney, female    DOB: 1965-04-16, 50 y.o.   MRN: [id number]  HPI Pt presents to the clinic with hair loss concerns. She has had this going on for 5 years. Seems to be worse at times then get better. No patches of hair loss. She has hypothyroidism, vitamin D and B12 deficiency. She is being treated since may 2017 for her vitamin deficiencys.   She does complain of some bilateral leg jumpy at bedtime. Going to sleep resolves it.   She is having some left numbness of index, middle finger. Worse in am. Seems to be worse around period.   Review of Systems    see HPI.  Objective:   Physical Exam  Constitutional: She is oriented to person, place, and time. She appears well-developed and well-nourished.  HENT:  Head: Normocephalic and atraumatic.  Erythematous macular rash with fine scales around scalp line occipitally.  1-2 strands of loose hair with random hair pull.  No patchy hair loss.  Seems to be some new hair growing frontally around scalp line.   Cardiovascular: Normal rate, regular rhythm and normal heart sounds.   Pulmonary/Chest: Effort normal and breath sounds normal.  Musculoskeletal:  Negative tinels.  Positive phalens, left.  Normal hand grip 5/5, bilaterally.   Neurological: She is alert and oriented to person, place, and time.  Psychiatric: She has a normal mood and affect. Her behavior is normal.          Assessment & Plan:  Hair loss- seems to be diffuse and more androgenic. I see new hair growth. Vitamin D, b12 and levothyroxine could be helping. Add ferrous sulfate 325mg  daily with biotin. Follow up in 6 months.   Seborrheic dermatitis- olux foam given to use as needed. HO given.   Hypothyroidism- lab printed to have done next month.   Left carpel tunnel syndrome- night splints and NSAID discussed. Declined EMG's. Follow up as needed.   RLS- start ferrous sulfate 325mg  once daily. HO given for symptomatic care. If  continues follow up and consider medication. Will check cbc and ferritin next month with labs.

## 2016-01-06 LAB — CBC
HEMATOCRIT: 42.4 % (ref 35.0–45.0)
Hemoglobin: 14.3 g/dL (ref 11.7–15.5)
MCH: 36.3 pg — ABNORMAL HIGH (ref 27.0–33.0)
MCHC: 33.7 g/dL (ref 32.0–36.0)
MCV: 107.6 fL — ABNORMAL HIGH (ref 80.0–100.0)
MPV: 9.4 fL (ref 7.5–12.5)
PLATELETS: 204 10*3/uL (ref 140–400)
RBC: 3.94 MIL/uL (ref 3.80–5.10)
RDW: 12.8 % (ref 11.0–15.0)
WBC: 8.2 10*3/uL (ref 3.8–10.8)

## 2016-01-06 LAB — VITAMIN B12: VITAMIN B 12: 535 pg/mL (ref 200–1100)

## 2016-01-06 LAB — VITAMIN D 25 HYDROXY (VIT D DEFICIENCY, FRACTURES): Vit D, 25-Hydroxy: 36 ng/mL (ref 30–100)

## 2016-01-06 LAB — FERRITIN: FERRITIN: 64 ng/mL (ref 10–232)

## 2016-01-06 LAB — TSH: TSH: 1.08 mIU/L

## 2016-01-06 LAB — T4, FREE: FREE T4: 0.9 ng/dL (ref 0.8–1.8)

## 2016-01-10 ENCOUNTER — Other Ambulatory Visit: Payer: Self-pay | Admitting: *Deleted

## 2016-01-10 MED ORDER — LEVOTHYROXINE SODIUM 88 MCG PO TABS: ORAL_TABLET | ORAL | 5 refills | Status: DC

## 2016-02-06 ENCOUNTER — Other Ambulatory Visit: Payer: Self-pay | Admitting: Physician Assistant

## 2016-03-04 ENCOUNTER — Other Ambulatory Visit: Payer: Self-pay | Admitting: Physician Assistant

## 2016-03-10 ENCOUNTER — Ambulatory Visit (INDEPENDENT_AMBULATORY_CARE_PROVIDER_SITE_OTHER): Payer: BC Managed Care – PPO | Admitting: Family Medicine

## 2016-03-10 ENCOUNTER — Encounter: Payer: Self-pay | Admitting: Family Medicine

## 2016-03-10 VITALS — BP 136/75 | HR 96 | Ht 66.0 in | Wt 175.0 lb

## 2016-03-10 DIAGNOSIS — J0100 Acute maxillary sinusitis, unspecified: Secondary | ICD-10-CM

## 2016-03-10 MED ORDER — AMOXICILLIN-POT CLAVULANATE 875-125 MG PO TABS: 1.0000 | ORAL_TABLET | Freq: Two times a day (BID) | ORAL | 0 refills | Status: DC

## 2016-03-10 NOTE — Progress Notes (Addendum)
   Subjective:    Patient ID: Heidi Carney, female    DOB: 10/08/1965, 50 y.o.   MRN: [id number]  HPI 50 year old female comes in today complaining of a respite or symptoms for 6 weeks. She said it initially started with a cough that lasted about 4 weeks before finally went away and she said then developed sinus symptoms with nasal congestion and drainage and pressure. She's had the sinus symptoms for almost 2 weeks. Discharge from nose is yellow.  She has pain around her eyebrows and under her eyes.   Taking Advil cold and sinus. No fever, sweats or chills.  No nausea vomiting or diarrhea with it.    Review of Systems     Objective:   Physical Exam  Constitutional: She is oriented to person, place, and time. She appears well-developed and well-nourished.  HENT:  Head: Normocephalic and atraumatic.  Right Ear: External ear normal.  Left Ear: External ear normal.  Nose: Nose normal.  Mouth/Throat: Oropharynx is clear and moist.  TMs and canals are clear.   Eyes: Conjunctivae and EOM are normal. Pupils are equal, round, and reactive to light.  Neck: Neck supple. No thyromegaly present.  Cardiovascular: Normal rate, regular rhythm and normal heart sounds.   Pulmonary/Chest: Effort normal and breath sounds normal. She has no wheezes.  Lymphadenopathy:    She has no cervical adenopathy.  Neurological: She is alert and oriented to person, place, and time.  Skin: Skin is warm and dry.  Psychiatric: She has a normal mood and affect.        Assessment & Plan:  Acute sinusitis - Will treat with Augmentin. Cough not better in one week. Okay to continue over-the-counter cough and cold medications. Recommend she hydrate well and return here humidifier. 3

## 2016-03-10 NOTE — Patient Instructions (Addendum)

## 2016-05-04 ENCOUNTER — Other Ambulatory Visit: Payer: Self-pay | Admitting: Physician Assistant

## 2016-05-12 ENCOUNTER — Ambulatory Visit (INDEPENDENT_AMBULATORY_CARE_PROVIDER_SITE_OTHER): Payer: BC Managed Care – PPO | Admitting: Physician Assistant

## 2016-05-12 VITALS — BP 142/88 | HR 102 | Temp 98.9°F

## 2016-05-12 DIAGNOSIS — R05 Cough: Secondary | ICD-10-CM | POA: Diagnosis not present

## 2016-05-12 DIAGNOSIS — R059 Cough, unspecified: Secondary | ICD-10-CM

## 2016-05-12 DIAGNOSIS — J0100 Acute maxillary sinusitis, unspecified: Secondary | ICD-10-CM | POA: Diagnosis not present

## 2016-05-12 DIAGNOSIS — F411 Generalized anxiety disorder: Secondary | ICD-10-CM | POA: Diagnosis not present

## 2016-05-12 MED ORDER — AZITHROMYCIN 250 MG PO TABS: ORAL_TABLET | ORAL | 0 refills | Status: DC

## 2016-05-12 MED ORDER — HYDROCODONE-HOMATROPINE 5-1.5 MG/5ML PO SYRP: 5.0000 mL | ORAL_SOLUTION | Freq: Every evening | ORAL | 0 refills | Status: DC | PRN

## 2016-05-12 MED ORDER — VENLAFAXINE HCL ER 75 MG PO CP24: 75.0000 mg | ORAL_CAPSULE | Freq: Every day | ORAL | 3 refills | Status: DC

## 2016-05-12 NOTE — Progress Notes (Signed)
   Subjective:    Patient ID: Heidi Carney, female    DOB: 01-15-1966, 51 y.o.   MRN: [id number]  HPI  Pt is a 51 yo female who presents to the clinic with sinus pressure, congestion, cough. She had flu last week and took tamiflu. She feels better except for lots of facial pain. Cough is keeping her up at night. No fever, ST. Taking advil cold and sinus with little relief.     Review of Systems  All other systems reviewed and are negative.      Objective:   Physical Exam  Constitutional: She is oriented to person, place, and time. She appears well-developed and well-nourished.  HENT:  Head: Normocephalic.  Right Ear: External ear normal.  Left Ear: External ear normal.  Mouth/Throat: Oropharynx is clear and moist.  TM"s erythematous. Light reflex present. Tenderness over maxillary and frontal sinuses.  Bilateral nasal turbinates red and swollen.   Eyes: Conjunctivae are normal.  Neck: Normal range of motion. Neck supple.  Cardiovascular: Normal rate, regular rhythm and normal heart sounds.   Pulmonary/Chest: Effort normal and breath sounds normal. She has no wheezes.  Lymphadenopathy:    She has no cervical adenopathy.  Neurological: She is alert and oriented to person, place, and time.  Psychiatric: She has a normal mood and affect. Her behavior is normal.          Assessment & Plan:  Marland KitchenMarland KitchenDiagnoses and all orders for this visit:  Acute non-recurrent maxillary sinusitis -     azithromycin (ZITHROMAX) 250 MG tablet; Take 2 tablet now and then one tablet for 4 days.  Cough -     HYDROcodone-homatropine (HYCODAN) 5-1.5 MG/5ML syrup; Take 5 mLs by mouth at bedtime as needed.  GAD (generalized anxiety disorder) -     venlafaxine XR (EFFEXOR-XR) 75 MG 24 hr capsule; Take 1 capsule (75 mg total) by mouth daily with breakfast.    Reviewed control substance database without concerns.   Symptomatic care given.   Doing great on current dose of effexor.   Follow up as  needed.

## 2016-05-12 NOTE — Patient Instructions (Signed)

## 2016-05-30 ENCOUNTER — Ambulatory Visit: Payer: BC Managed Care – PPO | Admitting: Physician Assistant

## 2016-06-09 ENCOUNTER — Other Ambulatory Visit: Payer: Self-pay | Admitting: Physician Assistant

## 2016-07-10 ENCOUNTER — Ambulatory Visit (INDEPENDENT_AMBULATORY_CARE_PROVIDER_SITE_OTHER): Payer: BC Managed Care – PPO | Admitting: Physician Assistant

## 2016-07-10 ENCOUNTER — Encounter: Payer: Self-pay | Admitting: Physician Assistant

## 2016-07-10 VITALS — BP 138/78 | HR 99 | Ht 66.0 in | Wt 173.0 lb

## 2016-07-10 DIAGNOSIS — R0789 Other chest pain: Secondary | ICD-10-CM | POA: Diagnosis not present

## 2016-07-10 DIAGNOSIS — E039 Hypothyroidism, unspecified: Secondary | ICD-10-CM

## 2016-07-10 DIAGNOSIS — F411 Generalized anxiety disorder: Secondary | ICD-10-CM | POA: Diagnosis not present

## 2016-07-10 DIAGNOSIS — G43009 Migraine without aura, not intractable, without status migrainosus: Secondary | ICD-10-CM | POA: Diagnosis not present

## 2016-07-10 NOTE — Progress Notes (Signed)
   Subjective:    Patient ID: Heidi Carney, female    DOB: May 17, 1965, 51 y.o.   MRN: [id number]  HPI  Pt is a 51 yo female who presents to the clinic for follow up. She is having a bit more anxiety and some right and left sided chest pain. These pains are similar to how she was feeling when her thyroid and anxiety were out of whack a year or so ago. She would like blood work. She has been losing weight with diet and exericse. She continues to have some migraines but about 1-2 a month and treated with goody's powder.     Review of Systems  All other systems reviewed and are negative.      Objective:   Physical Exam  Constitutional: She is oriented to person, place, and time. She appears well-developed and well-nourished.  HENT:  Head: Normocephalic and atraumatic.  Cardiovascular: Normal rate, regular rhythm and normal heart sounds.   Pulmonary/Chest: Effort normal and breath sounds normal.  Neurological: She is alert and oriented to person, place, and time.  Psychiatric: She has a normal mood and affect. Her behavior is normal.          Assessment & Plan:  Marland KitchenMarland KitchenKarlena was seen today for hypothyroidism.  Diagnoses and all orders for this visit:  Hypothyroidism, unspecified type -     TSH -     T4, free  GAD (generalized anxiety disorder)  Atypical chest pain  Migraine without aura and without status migrainosus, not intractable   .Marland Kitchen Depression screen PHQ 2/9 07/10/2016  Decreased Interest 1  Down, Depressed, Hopeless 0  PHQ - 2 Score 1   .. GAD 7 : Generalized Anxiety Score 09/13/2015  Nervous, Anxious, on Edge 1  Control/stop worrying 0  Worry too much - different things 1  Trouble relaxing 0  Restless 0  Easily annoyed or irritable 1  Afraid - awful might happen 0  Total GAD 7 Score 3  Anxiety Difficulty Not difficult at all    It does not seem to be anxiety causing symptoms. Stay at same effexor dose.  Will check TSH and adjust accordingly. If nothing  out of normal and symptoms do not improve please call clinic and discussed other work up.   BP improved on second recheck.   Continue using as needed goody's powder for migraines.

## 2016-07-11 ENCOUNTER — Telehealth: Payer: Self-pay

## 2016-07-11 ENCOUNTER — Encounter: Payer: Self-pay | Admitting: Physician Assistant

## 2016-07-11 LAB — TSH: TSH: 1.62 m[IU]/L

## 2016-07-11 LAB — T4, FREE: FREE T4: 0.8 ng/dL (ref 0.8–1.8)

## 2016-07-11 MED ORDER — LEVOTHYROXINE SODIUM 100 MCG PO TABS: 100.0000 ug | ORAL_TABLET | Freq: Every day | ORAL | 1 refills | Status: DC

## 2016-07-11 NOTE — Progress Notes (Signed)
Call pt: TSH looks perfect. Free T4 is lower limits of normal. We call this subclinical hypothyroidism. Since you are symptomatic we could consider increasing levothyroxine just a bit and rechecking labs in 4-6 weeks.

## 2016-07-11 NOTE — Telephone Encounter (Signed)
Pt said she would be fine increasing the levothyroxine if you feel that is necessary.  Please advise.

## 2016-07-11 NOTE — Telephone Encounter (Signed)
Sent to pharmacy 

## 2016-07-13 NOTE — Telephone Encounter (Signed)
Left message on VM.

## 2016-08-18 ENCOUNTER — Telehealth: Payer: Self-pay | Admitting: *Deleted

## 2016-08-18 DIAGNOSIS — E039 Hypothyroidism, unspecified: Secondary | ICD-10-CM

## 2016-08-18 NOTE — Telephone Encounter (Signed)
Thyroid labs ordered

## 2016-08-23 LAB — T4, FREE: Free T4: 1.3 ng/dL (ref 0.8–1.8)

## 2016-08-23 LAB — TSH: TSH: 0.19 mIU/L — ABNORMAL LOW

## 2016-08-23 NOTE — Telephone Encounter (Signed)
Call pt: free levels good. TSH just a hair low. If feeling fine. Stay on same dose and recheck in 3 months just to make sure not trending to go higher and dose may need to be decreased.  Please send over refills for 3 months if she is feeling ok.

## 2016-08-24 ENCOUNTER — Other Ambulatory Visit: Payer: Self-pay | Admitting: *Deleted

## 2016-08-24 MED ORDER — LEVOTHYROXINE SODIUM 100 MCG PO TABS: 100.0000 ug | ORAL_TABLET | Freq: Every day | ORAL | 2 refills | Status: DC

## 2016-08-31 ENCOUNTER — Other Ambulatory Visit: Payer: Self-pay | Admitting: Physician Assistant

## 2016-09-11 ENCOUNTER — Other Ambulatory Visit: Payer: Self-pay | Admitting: Family Medicine

## 2016-09-11 ENCOUNTER — Ambulatory Visit (INDEPENDENT_AMBULATORY_CARE_PROVIDER_SITE_OTHER): Payer: BC Managed Care – PPO | Admitting: Family Medicine

## 2016-09-11 ENCOUNTER — Encounter: Payer: Self-pay | Admitting: Family Medicine

## 2016-09-11 VITALS — BP 140/74 | HR 96 | Ht 64.17 in | Wt 177.0 lb

## 2016-09-11 DIAGNOSIS — T7840XA Allergy, unspecified, initial encounter: Secondary | ICD-10-CM | POA: Diagnosis not present

## 2016-09-11 MED ORDER — EPINEPHRINE 0.3 MG/0.3ML IJ SOAJ: 0.3000 mg | Freq: Once | INTRAMUSCULAR | 1 refills | Status: AC

## 2016-09-11 NOTE — Progress Notes (Signed)
   Subjective:    Patient ID: Heidi Carney, female    DOB: July 20, 1965, 51 y.o.   MRN: [id number]  HPI   51 year old female comes in today reporting that she had an allergic reaction to shrimp last week. She ate out [employer] and had a shrimp appetizer. and about 1.5 hour later she got vomiting, diarrhea, lip swelling, stomach cramps.  Tried OTC Benadryl.  Had similar but milder sxs about a month before.    Review of Systems     Objective:   Physical Exam  Constitutional: She is oriented to person, place, and time. She appears well-developed and well-nourished.  HENT:  Head: Normocephalic and atraumatic.  Right Ear: External ear normal.  Left Ear: External ear normal.  Nose: Nose normal.  Mouth/Throat: Oropharynx is clear and moist.  TMs and canals are clear.   Eyes: Conjunctivae and EOM are normal. Pupils are equal, round, and reactive to light.  Neck: Neck supple. No thyromegaly present.  Cardiovascular: Normal rate, regular rhythm and normal heart sounds.   Pulmonary/Chest: Effort normal and breath sounds normal. She has no wheezes.  Lymphadenopathy:    She has no cervical adenopathy.  Neurological: She is alert and oriented to person, place, and time.  Skin: Skin is warm and dry.  Psychiatric: She has a normal mood and affect.        Assessment & Plan:  Allergic reaction, initial encounter - likely to shrimp. We'll do some allergy testing with blood work today. This warning signs and symptoms for anaphylaxis. We'll give her perception for an EpiPen. We can certainly refer to allergy immunology if needed.

## 2016-09-12 LAB — ALLERGEN, SHRIMP F24: Shrimp IgE: <0.1 kU/L

## 2016-09-12 LAB — ALLERGEN, CRAB, F23: Crab: <0.1 kU/L

## 2016-09-12 LAB — ALLERGEN SCALLOPS F338

## 2016-09-13 ENCOUNTER — Telehealth: Payer: Self-pay | Admitting: *Deleted

## 2016-09-13 NOTE — Telephone Encounter (Signed)
Called pt back she is not having any SOB she is only experiencing hives. She has been taking benadryl and this has helped w/the itching. she remembers the ED doctor mentioning something about scromboid poisoning?   This is when shrimp are not handled properly. She just wanted to mention this also she wasn't sure if this was relevant or not. Will fwd to Dr. Madilyn Fireman. Maryruth Eve, Lahoma Crocker

## 2016-09-13 NOTE — Telephone Encounter (Signed)
That is certainly a possibility. Make sure taking a 24-hour acting antihistamine as well such as Zyrtec or Claritin twice a day. She can take the Benadryl on top of that if needed. I'm still waiting for the IgG levels. The IgE is are back and they are negative. We may end up considering referring her to allergy/immunology.

## 2016-09-13 NOTE — Telephone Encounter (Signed)
Pt called and lvm stating that she noticed that she had a welt on her stomach on Tuesday and another on her chest this morning and itching in different places on her body.Heidi Carney, Heidi Carney

## 2016-09-15 LAB — ALLERGEN, CRAB IGG: Crab IgG: 7.3 ug/mL — ABNORMAL HIGH (ref <2.0)

## 2016-09-15 LAB — ALLERGEN, SHRIMP IGG: Shrimp IgG: 10.3 ug/mL — ABNORMAL HIGH (ref <2.0)

## 2016-10-02 ENCOUNTER — Encounter: Payer: Self-pay | Admitting: Family Medicine

## 2016-10-02 ENCOUNTER — Encounter: Payer: Self-pay | Admitting: Physician Assistant

## 2016-10-02 ENCOUNTER — Ambulatory Visit (INDEPENDENT_AMBULATORY_CARE_PROVIDER_SITE_OTHER): Payer: BC Managed Care – PPO | Admitting: Physician Assistant

## 2016-10-02 VITALS — BP 145/83 | HR 93 | Temp 98.7°F | Ht 64.0 in | Wt 177.0 lb

## 2016-10-02 DIAGNOSIS — J019 Acute sinusitis, unspecified: Secondary | ICD-10-CM

## 2016-10-02 MED ORDER — AZITHROMYCIN 250 MG PO TABS: ORAL_TABLET | ORAL | 0 refills | Status: DC

## 2016-10-02 NOTE — Progress Notes (Signed)
HPI:                                                                Heidi Carney is a 51 y.o. female who presents to [facility] [place] today for sinus congestion  Sinus Problem  This is a new problem. The current episode started 1 to 4 weeks ago (x 2 weeks). The problem is unchanged. There has been no fever. The pain is mild. Associated symptoms include congestion and sinus pressure. Pertinent negatives include no chills, coughing, headaches, neck pain or sore throat. Treatments tried: Advil cold and sinus. The treatment provided mild relief.      No past medical history on file. Past Surgical History:  Procedure Laterality Date  . EYE SURGERY     Social History  Substance Use Topics  . Smoking status: Never Smoker  . Smokeless tobacco: Never Used  . Alcohol use Yes   family history includes Cancer in her father; Diabetes in her father; Stroke in her father.  ROS: negative except as noted in the HPI  Medications: Current Outpatient Prescriptions  Medication Sig Dispense Refill  . BIOTIN PO Take 2,500 mcg by mouth daily.    . cholecalciferol (VITAMIN D) 1000 units tablet Take 1,000 Units by mouth daily.    . cyanocobalamin (,VITAMIN B-12,) 1000 MCG/ML injection INJECT 1 ML (1,000 MCG TOTAL) INTO THE MUSCLE EVERY 30 (THIRTY) DAYS. 1 mL 5  . IRON PO Take by mouth.    . levothyroxine (SYNTHROID, LEVOTHROID) 100 MCG tablet Take 1 tablet (100 mcg total) by mouth daily. 30 tablet 2  . TURMERIC PO Take by mouth daily.    Marland Kitchen venlafaxine XR (EFFEXOR-XR) 75 MG 24 hr capsule Take 1 capsule (75 mg total) by mouth daily with breakfast. 90 capsule 3   No current facility-administered medications for this visit.    Allergies  Allergen Reactions  . Celexa [Citalopram Hydrobromide]     Made feel anxiety worse  . Effexor [Venlafaxine]     Blurry vision       Objective:  BP (!) 145/83   Pulse 93   Temp 98.7 F (37.1 C) (Oral)   Ht 5'  4" (1.626 m)   Wt 177 lb (80.3 kg)   BMI 30.38 kg/m  Gen: well-groomed, cooperative, not ill-appearing, no distress HEENT: normal conjunctiva, TM's clear, nasal mucosa edematous, rhinorrhea present, oropharynx clear, moist mucus membranes, no frontal or maxillary sinus tenderness, neck supple, trachea midline Pulm: Normal work of breathing, normal phonation, clear to auscultation bilaterally, no wheezes, rales or rhonchi CV: Normal rate, regular rhythm, s1 and s2 distinct, no murmurs, clicks or rubs  Neuro: alert and oriented x 3, EOM's intact, no tremor MSK: moving all extremities, normal gait and station, no peripheral edema Lymph: no cervical or tonsillar adenopathy Skin: warm, dry, intact; no rashes on exposed skin, no cyanosis   No results found for this or any previous visit (from the past 72 hour(s)). No results found.    Assessment and Plan: 51 y.o. female with   1. Acute non-recurrent sinusitis, unspecified location - azithromycin (ZITHROMAX Z-PAK) 250 MG tablet; Take 2 tablets (500 mg) on  Day 1,  followed by 1 tablet (250 mg) once daily on Days 2 through  5.  Dispense: 6 tablet; Refill: 0   Patient education and anticipatory guidance given Patient agrees with treatment plan Follow-up as needed if symptoms worsen or fail to improve  Darlyne Russian PA-C

## 2016-10-02 NOTE — Patient Instructions (Signed)

## 2016-10-03 NOTE — Telephone Encounter (Signed)
Called pt and lvm advising of recommendations if she is still having any issues.Heidi Carney

## 2016-10-19 ENCOUNTER — Encounter: Payer: Self-pay | Admitting: Family Medicine

## 2016-10-24 ENCOUNTER — Encounter: Payer: Self-pay | Admitting: Family Medicine

## 2016-10-25 ENCOUNTER — Encounter: Payer: Self-pay | Admitting: *Deleted

## 2016-11-08 ENCOUNTER — Telehealth: Payer: Self-pay

## 2016-11-08 DIAGNOSIS — E038 Other specified hypothyroidism: Secondary | ICD-10-CM

## 2016-11-08 DIAGNOSIS — E039 Hypothyroidism, unspecified: Secondary | ICD-10-CM

## 2016-11-08 NOTE — Telephone Encounter (Signed)
   Patient called and stated that she was to have her Thyroid labs rechecked. Per PCP note below. A Free T4 was ordered. Rhonda Cunningham,CMA   Progress Notes by Donella Stade, PA-C at 07/10/2016 2:30 PM   Author: Donella Stade, PA-C Author Type: Physician Assistant Filed: 07/11/2016 9:05 AM  Note Status: Signed Cosign: Cosign Not Required Encounter Date: 07/10/2016  Editor: Lavada Mesi (Physician Assistant)    Call pt: TSH looks perfect. Free T4 is lower limits of normal. We call this subclinical hypothyroidism. Since you are symptomatic we could consider increasing levothyroxine just a bit and rechecking labs in 4-6 weeks.

## 2016-11-23 ENCOUNTER — Other Ambulatory Visit: Payer: Self-pay | Admitting: Physician Assistant

## 2016-11-27 ENCOUNTER — Telehealth: Payer: Self-pay

## 2016-11-27 DIAGNOSIS — R7989 Other specified abnormal findings of blood chemistry: Secondary | ICD-10-CM

## 2016-11-27 NOTE — Telephone Encounter (Signed)
Pt called and stated that she needs an order for TSH put in for her 3 month lab check. Orders placed. LVM to let pt know that forms are at the front office.

## 2016-11-29 LAB — T4, FREE: Free T4: 1 ng/dL (ref 0.8–1.8)

## 2016-11-29 LAB — TSH: TSH: 0.38 m[IU]/L — AB

## 2016-11-29 MED ORDER — LEVOTHYROXINE SODIUM 88 MCG PO TABS
88.0000 ug | ORAL_TABLET | Freq: Every day | ORAL | 0 refills | Status: DC
Start: 2016-11-29 — End: 2017-02-09

## 2016-11-29 NOTE — Addendum Note (Signed)
Addended by: Donella Stade on: 11/29/2016 11:49 AM   Modules accepted: Orders

## 2016-11-29 NOTE — Telephone Encounter (Signed)
Call pt: thyroid is showing a little over supplemented. Decrease levothyoxine 61mcg. Will recheck in 3 months.

## 2016-12-02 ENCOUNTER — Other Ambulatory Visit: Payer: Self-pay | Admitting: Physician Assistant

## 2016-12-05 ENCOUNTER — Other Ambulatory Visit: Payer: Self-pay | Admitting: *Deleted

## 2016-12-31 ENCOUNTER — Other Ambulatory Visit: Payer: Self-pay | Admitting: Physician Assistant

## 2017-01-02 ENCOUNTER — Other Ambulatory Visit: Payer: Self-pay | Admitting: Physician Assistant

## 2017-01-09 ENCOUNTER — Encounter: Payer: Self-pay | Admitting: Physician Assistant

## 2017-01-09 ENCOUNTER — Telehealth: Payer: Self-pay | Admitting: Physician Assistant

## 2017-01-09 ENCOUNTER — Ambulatory Visit (INDEPENDENT_AMBULATORY_CARE_PROVIDER_SITE_OTHER): Payer: BC Managed Care – PPO | Admitting: Physician Assistant

## 2017-01-09 VITALS — BP 142/83 | HR 87 | Temp 98.1°F | Wt 176.0 lb

## 2017-01-09 DIAGNOSIS — H8110 Benign paroxysmal vertigo, unspecified ear: Secondary | ICD-10-CM | POA: Insufficient documentation

## 2017-01-09 DIAGNOSIS — I10 Essential (primary) hypertension: Secondary | ICD-10-CM

## 2017-01-09 MED ORDER — MECLIZINE HCL 25 MG PO TABS: 25.0000 mg | ORAL_TABLET | Freq: Three times a day (TID) | ORAL | 0 refills | Status: DC | PRN

## 2017-01-09 MED ORDER — PROMETHAZINE HCL 25 MG/ML IJ SOLN
25.0000 mg | Freq: Once | INTRAMUSCULAR | Status: AC
  Administered 2017-01-09: 25 mg via INTRAMUSCULAR

## 2017-01-09 NOTE — Progress Notes (Addendum)
HPI:                                                                Heidi Carney is a 51 y.o. female who presents to [facility] today for dizziness  This pleasant 51 yo F with PMH of HTN, migraines, GAD, RLS, B12 deficiency, hypothyroid presents today for dizziness.  Onset: abrupt, yesterday morning Duration: intermittent precipitating factors: none, woke up feeling "off balance" worse w/head movements: yes history of vertigo: no hearing change: no gait disturbance: no prodromal sx: no syncope: no Associated symptoms: sinus pressure, intermittent L ear discomfort  pertinent negatives:  staggering or ataxic gait, vomiting, headache, double vision, visual loss, slurred speech, numbness of the face or body, weakness, clumsiness, or incoordination   No past medical history on file. Past Surgical History:  Procedure Laterality Date  . EYE SURGERY     Social History  Substance Use Topics  . Smoking status: Never Smoker  . Smokeless tobacco: Never Used  . Alcohol use Yes   family history includes Cancer in her father; Diabetes in her father; Stroke in her father.  ROS: negative except as noted in the HPI  Medications: Current Outpatient Prescriptions  Medication Sig Dispense Refill  . BIOTIN PO Take 2,500 mcg by mouth daily.    . cholecalciferol (VITAMIN D) 1000 units tablet Take 1,000 Units by mouth daily.    . cyanocobalamin (,VITAMIN B-12,) 1000 MCG/ML injection INJECT 1ML INTO THE MUSCLE EVERY 30 DAYS 1 mL 0  . IRON PO Take by mouth.    . levothyroxine (SYNTHROID, LEVOTHROID) 88 MCG tablet Take 1 tablet (88 mcg total) by mouth daily. 90 tablet 0  . TURMERIC PO Take by mouth daily.    Marland Kitchen venlafaxine XR (EFFEXOR-XR) 75 MG 24 hr capsule Take 1 capsule (75 mg total) by mouth daily with breakfast. 90 capsule 3  . meclizine (ANTIVERT) 25 MG tablet Take 1 tablet (25 mg total) by mouth 3 (three) times daily as needed for dizziness  or nausea. 30 tablet 0   No current facility-administered medications for this visit.    Allergies  Allergen Reactions  . Celexa [Citalopram Hydrobromide]     Made feel anxiety worse  . Effexor [Venlafaxine]     Blurry vision       Objective:  BP (!) 142/83   Pulse 87   Temp 98.1 F (36.7 C) (Oral)   Wt 176 lb (79.8 kg)   BMI 30.21 kg/m  Gen:  alert, not ill-appearing, no distress, appropriate for age 23: head normocephalic without obvious abnormality, ear canals and TM's clear bilaterally, conjunctiva and cornea clear, trachea midline Pulm: Normal work of breathing, normal phonation, clear to auscultation bilaterally, no wheezes, rales or rhonchi CV: Normal rate, regular rhythm, s1 and s2 distinct, no murmurs, clicks or rubs  Neuro: alert and oriented x 3, Dix Hallpike maneuver produces dizziness, EOMs intact, no nystagmus, no tremor MSK: extremities atraumatic, normal gait and station Skin: intact, no rashes on exposed skin, no jaundice, no cyanosis Psych: well-groomed, cooperative, good eye contact, anxious affect, speech is articulate, and thought processes clear and goal-directed    No results found for this or any previous visit (from the past 72 hour(s)). No results found.  Assessment and Plan: 51 y.o. female with   1. Benign paroxysmal positional vertigo, unspecified laterality - Phenergan 25 mg IM given in office - instructed patient on Epley maneuver. Symptomatic management with Meclizine. Vestibular rehab if no improvement  - meclizine (ANTIVERT) 25 MG tablet; Take 1 tablet (25 mg total) by mouth 3 (three) times daily as needed for dizziness or nausea.  Dispense: 30 tablet; Refill: 0 - Ambulatory referral to Physical Therapy for vestibular rehab  2. Essential hypertension BP Readings from Last 3 Encounters:  01/09/17 (!) 142/83  10/02/16 (!) 145/83  09/11/16 140/74  - follow-up with PCP in 1 week   Patient education and anticipatory guidance  given Patient agrees with treatment plan Follow-up in 1 week with PCP or sooner as needed if symptoms worsen or fail to improve  Darlyne Russian PA-C

## 2017-01-09 NOTE — Patient Instructions (Signed)

## 2017-01-22 ENCOUNTER — Ambulatory Visit (INDEPENDENT_AMBULATORY_CARE_PROVIDER_SITE_OTHER): Payer: BC Managed Care – PPO | Admitting: Physician Assistant

## 2017-01-22 VITALS — BP 167/77 | HR 98 | Ht 64.02 in | Wt 180.0 lb

## 2017-01-22 DIAGNOSIS — R1032 Left lower quadrant pain: Secondary | ICD-10-CM | POA: Diagnosis not present

## 2017-01-22 DIAGNOSIS — R030 Elevated blood-pressure reading, without diagnosis of hypertension: Secondary | ICD-10-CM

## 2017-01-22 DIAGNOSIS — N83201 Unspecified ovarian cyst, right side: Secondary | ICD-10-CM

## 2017-01-22 DIAGNOSIS — E039 Hypothyroidism, unspecified: Secondary | ICD-10-CM

## 2017-01-22 DIAGNOSIS — K5901 Slow transit constipation: Secondary | ICD-10-CM | POA: Diagnosis not present

## 2017-01-22 DIAGNOSIS — H8110 Benign paroxysmal vertigo, unspecified ear: Secondary | ICD-10-CM | POA: Diagnosis not present

## 2017-01-22 MED ORDER — POLYETHYLENE GLYCOL 3350 17 G PO PACK: 17.0000 g | PACK | Freq: Two times a day (BID) | ORAL | 3 refills | Status: DC

## 2017-01-22 NOTE — Progress Notes (Signed)
   Subjective:    Patient ID: Heidi Carney, female    DOB: 02/02/1966, 51 y.o.   MRN: [id number]  HPI  Pt is a 51 yo female who presents to the clinic to follow up on BPPV dx on 10/9. All of those symptoms have resolved.   She has other concerns:   She is having some left lower quadrant pain that is intermittent and dull. She went to GYN and u/s was done and showed right ovarian cyst that they are watching. She denies any bowel changes. She tends to always be more constipated with hard stools since hypothyroid dx. She is eating prunes regularly. She has BM every 3 days with some straining. No fever, chills, n/v.   She checks her BP at home and running 127/80. She is worried when she comes in here. Denies any CP, palpitations, headaches.   .. Active Ambulatory Problems    Diagnosis Date Noted  . GAD (generalized anxiety disorder) 01/26/2015  . Elevated blood pressure reading 01/26/2015  . Thyroid activity decreased 01/26/2015  . Atypical chest pain 01/26/2015  . Seborrheic dermatitis 03/10/2015  . Overweight 03/17/2015  . Perimenopausal symptoms 09/13/2015  . Hair loss 11/16/2015  . RLS (restless legs syndrome) 11/16/2015  . B12 deficiency 11/16/2015  . Vitamin D insufficiency 11/16/2015  . Migraine headache without aura 07/10/2016  . Benign paroxysmal positional vertigo 01/09/2017  . Essential hypertension 01/09/2017  . Left lower quadrant pain 01/24/2017  . Slow transit constipation 01/24/2017   Resolved Ambulatory Problems    Diagnosis Date Noted  . No Resolved Ambulatory Problems   No Additional Past Medical History      Review of Systems  All other systems reviewed and are negative.      Objective:   Physical Exam  Constitutional: She is oriented to person, place, and time. She appears well-developed and well-nourished.  HENT:  Head: Normocephalic and atraumatic.  Cardiovascular: Normal rate, regular rhythm and normal heart sounds.   Pulmonary/Chest: Effort  normal and breath sounds normal.  Abdominal: Soft. Bowel sounds are normal. She exhibits no distension and no mass. There is tenderness. There is no rebound and no guarding.  Mild LLQ tenderness to palpation. No guarding or rebound.   Neurological: She is alert and oriented to person, place, and time.  Psychiatric: She has a normal mood and affect. Her behavior is normal.          Assessment & Plan:  Marland KitchenMarland KitchenDiagnoses and all orders for this visit:  Left lower quadrant pain -     polyethylene glycol (MIRALAX / GLYCOLAX) packet; Take 17 g by mouth 2 (two) times daily. Until stooling regularly  Acquired hypothyroidism -     TSH  Elevated blood pressure reading  Benign paroxysmal positional vertigo, unspecified laterality  Slow transit constipation -     polyethylene glycol (MIRALAX / GLYCOLAX) packet; Take 17 g by mouth 2 (two) times daily. Until stooling regularly   I suspect constipation could be causing LLQ pain. No fever, chills and pain not consisent with diverticulitis. She has had u/s and ruled out cyst. Lets work to get bowels moving great and then follow up for next step. miralax daily started. HO given.  She has follow up on right ovarian cyst in next month. Keep close follow up until resolution.   Will recheck TSH due to being too low recently and medicaton change.   BP up patient report great readings at home and at GYN.   Dizziness has resolved.

## 2017-01-23 ENCOUNTER — Other Ambulatory Visit: Payer: Self-pay | Admitting: *Deleted

## 2017-01-23 MED ORDER — CYANOCOBALAMIN 1000 MCG/ML IJ SOLN: INTRAMUSCULAR | 0 refills | Status: DC

## 2017-01-24 ENCOUNTER — Encounter: Payer: Self-pay | Admitting: Physician Assistant

## 2017-01-24 DIAGNOSIS — R1032 Left lower quadrant pain: Secondary | ICD-10-CM | POA: Insufficient documentation

## 2017-01-24 DIAGNOSIS — N83201 Unspecified ovarian cyst, right side: Secondary | ICD-10-CM | POA: Insufficient documentation

## 2017-01-24 DIAGNOSIS — K5901 Slow transit constipation: Secondary | ICD-10-CM | POA: Insufficient documentation

## 2017-02-06 DIAGNOSIS — D279 Benign neoplasm of unspecified ovary: Secondary | ICD-10-CM | POA: Insufficient documentation

## 2017-02-09 ENCOUNTER — Other Ambulatory Visit: Payer: Self-pay | Admitting: *Deleted

## 2017-02-09 LAB — TSH: TSH: 0.84 mIU/L

## 2017-02-09 MED ORDER — LEVOTHYROXINE SODIUM 88 MCG PO TABS: 88.0000 ug | ORAL_TABLET | Freq: Every day | ORAL | 1 refills | Status: DC

## 2017-02-12 ENCOUNTER — Ambulatory Visit: Payer: BC Managed Care – PPO | Admitting: Physician Assistant

## 2017-02-12 ENCOUNTER — Encounter: Payer: Self-pay | Admitting: Physician Assistant

## 2017-02-12 VITALS — BP 171/58 | HR 89 | Ht 64.0 in | Wt 177.0 lb

## 2017-02-12 DIAGNOSIS — K5901 Slow transit constipation: Secondary | ICD-10-CM

## 2017-02-12 DIAGNOSIS — R143 Flatulence: Secondary | ICD-10-CM | POA: Diagnosis not present

## 2017-02-12 DIAGNOSIS — R140 Abdominal distension (gaseous): Secondary | ICD-10-CM

## 2017-02-12 DIAGNOSIS — R195 Other fecal abnormalities: Secondary | ICD-10-CM | POA: Diagnosis not present

## 2017-02-12 DIAGNOSIS — R030 Elevated blood-pressure reading, without diagnosis of hypertension: Secondary | ICD-10-CM

## 2017-02-12 NOTE — Patient Instructions (Signed)
Abdominal Bloating °When you have abdominal bloating, your abdomen may feel full, tight, or painful. It may also look bigger than normal or swollen (distended). Common causes of abdominal bloating include: °· Swallowing air. °· Constipation. °· Problems digesting food. °· Eating too much. °· Irritable bowel syndrome. This is a condition that affects the large intestine. °· Lactose intolerance. This is an inability to digest lactose, a natural sugar in dairy products. °· Celiac disease. This is a condition that affects the ability to digest gluten, a protein found in some grains. °· Gastroparesis. This is a condition that slows down the movement of food in the stomach and small intestine. It is more common in people with diabetes mellitus. °· Gastroesophageal reflux disease (GERD). This is a digestive condition that makes stomach acid flow back into the esophagus. °· Urinary retention. This means that the body is holding onto urine, and the bladder cannot be emptied all the way. ° °Follow these instructions at home: °Eating and drinking °· Avoid eating too much. °· Try not to swallow air while talking or eating. °· Avoid eating while lying down. °· Avoid these foods and drinks: °? Foods that cause gas, such as broccoli, cabbage, cauliflower, and baked beans. °? Carbonated drinks. °? Hard candy. °? Chewing gum. °Medicines °· Take over-the-counter and prescription medicines only as told by your health care provider. °· Take probiotic medicines. These medicines contain live bacteria or yeasts that can help digestion. °· Take coated peppermint oil capsules. °Activity °· Try to exercise regularly. Exercise may help to relieve bloating that is caused by gas and relieve constipation. °General instructions °· Keep all follow-up visits as told by your health care provider. This is important. °Contact a health care provider if: °· You have nausea and vomiting. °· You have diarrhea. °· You have abdominal pain. °· You have  unusual weight loss or weight gain. °· You have severe pain, and medicines do not help. °Get help right away if: °· You have severe chest pain. °· You have trouble breathing. °· You have shortness of breath. °· You have trouble urinating. °· You have darker urine than normal. °· You have blood in your stools or have dark, tarry stools. °Summary °· Abdominal bloating means that the abdomen is swollen. °· Common causes of abdominal bloating are swallowing air, constipation, and problems digesting food. °· Avoid eating too much and avoid swallowing air. °· Avoid foods that cause gas, carbonated drinks, hard candy, and chewing gum. °This information is not intended to replace advice given to you by your health care provider. Make sure you discuss any questions you have with your health care provider. °Document Released: 04/21/2016 Document Revised: 04/21/2016 Document Reviewed: 04/21/2016 °Elsevier Interactive Patient Education © 2018 [employer] ° °

## 2017-02-12 NOTE — Progress Notes (Signed)
   Subjective:    Patient ID: Heidi Carney, female    DOB: [birth year], 51 y.o.   MRN: [id number]  HPI  Pt is a 51 yo female who presents to the clinic to follow-up on left lower quadrant pain.  Pain has resolved with MiraLAX daily.  She reports her bowel movements are much more complete.  She does report that they seen like they are more narrower than they should be.  She denies any blood or mucus in stool.  She does feel like she has a lot of bloating and gas.  She does not know of any triggers at this time.  She does notice that the bloating and gas is more prevalent when she tries to run or jog.  Patient does have an elevated blood pressure reading today.  She denies any chest pain, headaches, palpitations.  She does admit that she has had some sinus congestion and took a over-the-counter Sudafed right before she came.  .. Active Ambulatory Problems    Diagnosis Date Noted  . GAD (generalized anxiety disorder) 01/26/2015  . Elevated blood pressure reading 01/26/2015  . Thyroid activity decreased 01/26/2015  . Atypical chest pain 01/26/2015  . Seborrheic dermatitis 03/10/2015  . Overweight 03/17/2015  . Perimenopausal symptoms 09/13/2015  . Hair loss 11/16/2015  . RLS (restless legs syndrome) 11/16/2015  . B12 deficiency 11/16/2015  . Vitamin D insufficiency 11/16/2015  . Migraine headache without aura 07/10/2016  . Benign paroxysmal positional vertigo 01/09/2017  . Essential hypertension 01/09/2017  . Left lower quadrant pain 01/24/2017  . Slow transit constipation 01/24/2017  . Right ovarian cyst 01/24/2017  . Change in stool 02/12/2017   Resolved Ambulatory Problems    Diagnosis Date Noted  . No Resolved Ambulatory Problems   No Additional Past Medical History     Review of Systems  All other systems reviewed and are negative.      Objective:   Physical Exam  Constitutional: She is oriented to person, place, and time. She appears well-developed and well-nourished.   HENT:  Head: Normocephalic and atraumatic.  Cardiovascular: Normal rate, regular rhythm and normal heart sounds.  Pulmonary/Chest: Effort normal and breath sounds normal.  Abdominal: Soft. Bowel sounds are normal. She exhibits no distension and no mass. There is no tenderness. There is no rebound and no guarding.  Neurological: She is alert and oriented to person, place, and time.  Psychiatric: She has a normal mood and affect. Her behavior is normal.          Assessment & Plan:  Marland KitchenMarland KitchenDiagnoses and all orders for this visit:  Bloating  Flatulence  Slow transit constipation  Change in stool -     Ambulatory referral to Gastroenterology  Elevated blood pressure reading   Sounds like resolution of LLQ was due to constipation. Pt is close to her 52 year screening colonoscopy but she has had some narrowing of stools so we order colonoscopy sooner.   Discussed keeping journal for bloating and gas. Consider probiotic. Consider elimination of dairy then gluten to see if a trigger. GASX as needed. Follow up if not improving.   Discussed BP could be due to sudafed. Keep checking at home. 2 week follow up for nurse visit recheck.   Marland Kitchen.Spent 30 minutes with patient and greater than 50 percent of visit spent counseling patient regarding treatment plan.

## 2017-02-20 ENCOUNTER — Other Ambulatory Visit: Payer: Self-pay | Admitting: *Deleted

## 2017-02-20 DIAGNOSIS — F411 Generalized anxiety disorder: Secondary | ICD-10-CM

## 2017-02-20 MED ORDER — VENLAFAXINE HCL ER 75 MG PO CP24: 75.0000 mg | ORAL_CAPSULE | Freq: Every day | ORAL | 1 refills | Status: DC

## 2017-02-26 ENCOUNTER — Other Ambulatory Visit: Payer: Self-pay | Admitting: Physician Assistant

## 2017-03-06 ENCOUNTER — Encounter: Payer: Self-pay | Admitting: Physician Assistant

## 2017-03-07 ENCOUNTER — Other Ambulatory Visit: Payer: Self-pay | Admitting: Physician Assistant

## 2017-03-07 DIAGNOSIS — E538 Deficiency of other specified B group vitamins: Secondary | ICD-10-CM

## 2017-03-22 LAB — VITAMIN B12: VITAMIN B 12: 538 pg/mL (ref 200–1100)

## 2017-04-05 DIAGNOSIS — K641 Second degree hemorrhoids: Secondary | ICD-10-CM | POA: Insufficient documentation

## 2017-04-20 ENCOUNTER — Other Ambulatory Visit: Payer: Self-pay | Admitting: Physician Assistant

## 2017-04-21 ENCOUNTER — Encounter: Payer: Self-pay | Admitting: Physician Assistant

## 2017-04-24 ENCOUNTER — Other Ambulatory Visit: Payer: Self-pay | Admitting: Physician Assistant

## 2017-04-27 ENCOUNTER — Ambulatory Visit: Payer: Self-pay | Admitting: Physician Assistant

## 2017-05-04 DIAGNOSIS — Z90710 Acquired absence of both cervix and uterus: Secondary | ICD-10-CM

## 2017-05-04 HISTORY — DX: Acquired absence of both cervix and uterus: Z90.710

## 2017-05-04 HISTORY — PX: LAPAROSCOPIC TOTAL HYSTERECTOMY: SUR800

## 2017-05-08 ENCOUNTER — Ambulatory Visit: Payer: BC Managed Care – PPO | Admitting: Physician Assistant

## 2017-05-08 ENCOUNTER — Encounter: Payer: Self-pay | Admitting: Physician Assistant

## 2017-05-08 VITALS — BP 146/64 | HR 85 | Ht 64.0 in | Wt 179.0 lb

## 2017-05-08 DIAGNOSIS — J0100 Acute maxillary sinusitis, unspecified: Secondary | ICD-10-CM

## 2017-05-08 MED ORDER — AZITHROMYCIN 250 MG PO TABS: ORAL_TABLET | ORAL | 0 refills | Status: DC

## 2017-05-08 NOTE — Progress Notes (Signed)
   Subjective:    Patient ID: Heidi Carney, female    DOB: [birth year], 52 y.o.   MRN: [id number]  HPI  Pt is a 52 yo female who presents to the clinic with 2 weeks of sinus pressure, cough, nasal congestion, rhinorrhea, ear pressure. Her cold symptoms have improved and now she can feel pressure all the time. She has PND coming down the back of her throat. She has been taking advil cold and sinus with little relief. She would like to get better before her hysterectomy on feb 28th.   .. Active Ambulatory Problems    Diagnosis Date Noted  . GAD (generalized anxiety disorder) 01/26/2015  . Elevated blood pressure reading 01/26/2015  . Thyroid activity decreased 01/26/2015  . Atypical chest pain 01/26/2015  . Seborrheic dermatitis 03/10/2015  . Overweight 03/17/2015  . Perimenopausal symptoms 09/13/2015  . Hair loss 11/16/2015  . RLS (restless legs syndrome) 11/16/2015  . B12 deficiency 11/16/2015  . Vitamin D insufficiency 11/16/2015  . Migraine headache without aura 07/10/2016  . Benign paroxysmal positional vertigo 01/09/2017  . Essential hypertension 01/09/2017  . Left lower quadrant pain 01/24/2017  . Slow transit constipation 01/24/2017  . Right ovarian cyst 01/24/2017  . Change in stool 02/12/2017   Resolved Ambulatory Problems    Diagnosis Date Noted  . No Resolved Ambulatory Problems   No Additional Past Medical History     Review of Systems  All other systems reviewed and are negative.      Objective:   Physical Exam  Constitutional: She is oriented to person, place, and time. She appears well-developed and well-nourished.  HENT:  Head: Normocephalic and atraumatic.  Right Ear: External ear normal.  Left Ear: External ear normal.  TM's erythematous with some dullness to membrane.  Tenderness over maxillary sinuses to palpation.  Nasal turbinates red and swollen.  Oropharynx erythematous with PND.   Eyes: Conjunctivae are normal. Right eye exhibits no  discharge. Left eye exhibits no discharge.  Neck: Normal range of motion. Neck supple.  Cardiovascular: Normal rate, regular rhythm and normal heart sounds.  Pulmonary/Chest: Effort normal and breath sounds normal. She has no wheezes.  Lymphadenopathy:    She has cervical adenopathy.  Neurological: She is alert and oriented to person, place, and time.  Psychiatric: She has a normal mood and affect. Her behavior is normal.          Assessment & Plan:  Marland KitchenMarland KitchenMaylen was seen today for sore throat.  Diagnoses and all orders for this visit:  Acute non-recurrent maxillary sinusitis -     azithromycin (ZITHROMAX) 250 MG tablet; Take 2 tablets now and then one tablet for 4 days.   Discussed symptomatic care. Encouraged flonase daily for the next 4 days. Start zpak. Follow up as needed.

## 2017-05-08 NOTE — Patient Instructions (Signed)

## 2017-05-25 ENCOUNTER — Encounter: Payer: Self-pay | Admitting: Physician Assistant

## 2017-05-28 ENCOUNTER — Other Ambulatory Visit: Payer: Self-pay | Admitting: Physician Assistant

## 2017-05-28 MED ORDER — SYNTHROID 88 MCG PO TABS: 88.0000 ug | ORAL_TABLET | Freq: Every day | ORAL | 1 refills | Status: DC

## 2017-05-31 DIAGNOSIS — N838 Other noninflammatory disorders of ovary, fallopian tube and broad ligament: Secondary | ICD-10-CM | POA: Insufficient documentation

## 2017-07-04 ENCOUNTER — Telehealth: Payer: Self-pay | Admitting: Physician Assistant

## 2017-07-04 NOTE — Telephone Encounter (Signed)
Thyroid medicine- would like to go brand. I am doing a lowest dose of estrogen/hormone therapy after total hysterectomy. Will this affect my thyroid medicine? Patient sent this msg via [place].

## 2017-07-04 NOTE — Telephone Encounter (Signed)
Routing to PCP for review. Pt does have appt tomorrow with PCP.

## 2017-07-05 ENCOUNTER — Ambulatory Visit: Payer: BC Managed Care – PPO | Admitting: Physician Assistant

## 2017-07-05 ENCOUNTER — Encounter: Payer: Self-pay | Admitting: Physician Assistant

## 2017-07-05 VITALS — BP 159/66 | HR 91 | Ht 64.0 in | Wt 182.0 lb

## 2017-07-05 DIAGNOSIS — R1115 Cyclical vomiting syndrome unrelated to migraine: Secondary | ICD-10-CM

## 2017-07-05 DIAGNOSIS — G43A1 Cyclical vomiting, intractable: Secondary | ICD-10-CM

## 2017-07-05 DIAGNOSIS — E039 Hypothyroidism, unspecified: Secondary | ICD-10-CM | POA: Diagnosis not present

## 2017-07-05 DIAGNOSIS — T7840XD Allergy, unspecified, subsequent encounter: Secondary | ICD-10-CM | POA: Diagnosis not present

## 2017-07-05 DIAGNOSIS — R197 Diarrhea, unspecified: Secondary | ICD-10-CM

## 2017-07-05 DIAGNOSIS — R109 Unspecified abdominal pain: Secondary | ICD-10-CM

## 2017-07-05 DIAGNOSIS — R110 Nausea: Secondary | ICD-10-CM | POA: Diagnosis not present

## 2017-07-05 MED ORDER — SYNTHROID 88 MCG PO TABS: 88.0000 ug | ORAL_TABLET | Freq: Every day | ORAL | 1 refills | Status: DC

## 2017-07-05 MED ORDER — PROMETHAZINE HCL 25 MG PO TABS: 25.0000 mg | ORAL_TABLET | Freq: Four times a day (QID) | ORAL | 1 refills | Status: DC | PRN

## 2017-07-05 NOTE — Progress Notes (Signed)
Subjective:    Patient ID: Heidi Carney, female    DOB: 07/25/1965, 52 y.o.   MRN: [id number]  HPI  Pt is a 52 yo female who presents to the clinic to discuss allergic reaction that has happened twice after eating salad at restaurants. Her first episode was at [employer] and the second episode happened at [employer]. Both after eating salads. She discussed with [employer] and showed her the vegetable oil wash they use to cleanse salad. She has not talked to [facility]. Her suspicion is that it could have some sulfa like component to it. What happened both times is about 1-2 hours after eating salad she felt flushed and a little itching. She then became nauseated with abdominal cramps and diarrhea. She vomits a few times and has diarrhea and then usually resolves in a few hours. No fever. No lip swelling or problems swallowing. Symptoms are severe while happening but resolve after a few episodes of diarrhea, vomiting.   Pt wants to know if ok to take synthroid and vivelle patch together.   .. Active Ambulatory Problems    Diagnosis Date Noted  . GAD (generalized anxiety disorder) 01/26/2015  . Elevated blood pressure reading 01/26/2015  . Thyroid activity decreased 01/26/2015  . Atypical chest pain 01/26/2015  . Seborrheic dermatitis 03/10/2015  . Overweight 03/17/2015  . Perimenopausal symptoms 09/13/2015  . Hair loss 11/16/2015  . RLS (restless legs syndrome) 11/16/2015  . B12 deficiency 11/16/2015  . Vitamin D insufficiency 11/16/2015  . Migraine headache without aura 07/10/2016  . Benign paroxysmal positional vertigo 01/09/2017  . Essential hypertension 01/09/2017  . Left lower quadrant pain 01/24/2017  . Slow transit constipation 01/24/2017  . Right ovarian cyst 01/24/2017  . Change in stool 02/12/2017  . Nausea 07/07/2017  . Intractable cyclical vomiting with nausea 07/07/2017  . Diarrhea 07/07/2017  . Abdominal cramping 07/07/2017  . Allergic reaction  07/07/2017   Resolved Ambulatory Problems    Diagnosis Date Noted  . No Resolved Ambulatory Problems   Past Medical History:  Diagnosis Date  . 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     Objective:   Physical Exam  Constitutional: She is oriented to person, place, and time. She appears well-developed and well-nourished.  HENT:  Head: Normocephalic and atraumatic.  Neck: Normal range of motion. Neck supple.  Cardiovascular: Normal rate, regular rhythm and normal heart sounds.  Pulmonary/Chest: Effort normal and breath sounds normal.  Abdominal: Soft. Bowel sounds are normal. She exhibits no distension and no mass. There is no tenderness. There is no rebound and no guarding.  Lymphadenopathy:    She has no cervical adenopathy.  Neurological: She is alert and oriented to person, place, and time.  Psychiatric: She has a normal mood and affect. Her behavior is normal.          Assessment & Plan:  Marland KitchenMarland KitchenDiagnoses and all orders for this visit:  Allergic reaction, subsequent encounter -     Allergen food profile specific IgE -     Allergens (22) Foods -     EPINEPHrine (ADRENACLICK) 0.3 NF/6.2 mL IJ SOAJ injection; Inject 0.3 mLs (0.3 mg total) into the muscle once for 1 dose. For anaphylaxis reaction. . -     Ambulatory referral to Allergy  Nausea -     promethazine (PHENERGAN) 25 MG tablet; Take 1 tablet (25 mg total) by mouth every 6 (six) hours as needed for nausea  or vomiting. -     Ambulatory referral to Allergy  Intractable cyclical vomiting with nausea -     promethazine (PHENERGAN) 25 MG tablet; Take 1 tablet (25 mg total) by mouth every 6 (six) hours as needed for nausea or vomiting. -     Allergen food profile specific IgE -     Allergens (22) Foods -     Ambulatory referral to Allergy  Diarrhea, unspecified type -     Allergen food profile specific IgE -     Allergens (22) Foods -     Ambulatory referral to Allergy  Abdominal  cramping -     Allergen food profile specific IgE -     Allergens (22) Foods -     Ambulatory referral to Allergy  Hypothyroidism, unspecified type -     SYNTHROID 88 MCG tablet; Take 1 tablet (88 mcg total) by mouth daily before breakfast.  .. Depression screen [facility] 2/9 07/05/2017 01/09/2017 07/10/2016  Decreased Interest 0 1 1  Down, Depressed, Hopeless 0 0 0  PHQ - 2 Score 0 1 1  Altered sleeping - 0 -  Tired, decreased energy - 1 -  Change in appetite - 0 -  Feeling bad or failure about yourself  - 1 -  Trouble concentrating - 0 -  Moving slowly or fidgety/restless - 0 -  Suicidal thoughts - 0 -  PHQ-9 Score - 3 -  Difficult doing work/chores - Not difficult at all -   Unclear what is causing this or if just a more severe IBS symptoms. Start with food allergy testing in office. Will make referral to allergy. When has an episode start benadryl and consider phenergan as needed for nausea and vomiting. Discussed anaphylaxis but she denies any symptoms. Decided after visit to give her an epi pen if she needs it. Talk to [facility] and see what they wash lettuce in. We could have found a common link.   She request brand only synthroid to be sent. It has already been sent. oked to take HRT with synthroid.

## 2017-07-05 NOTE — Telephone Encounter (Signed)
Great.  Thanks

## 2017-07-05 NOTE — Telephone Encounter (Signed)
Pt seen in clinic today and all questions answered.

## 2017-07-07 ENCOUNTER — Encounter: Payer: Self-pay | Admitting: Physician Assistant

## 2017-07-07 ENCOUNTER — Telehealth: Payer: Self-pay | Admitting: Physician Assistant

## 2017-07-07 DIAGNOSIS — R109 Unspecified abdominal pain: Secondary | ICD-10-CM | POA: Insufficient documentation

## 2017-07-07 DIAGNOSIS — R197 Diarrhea, unspecified: Secondary | ICD-10-CM | POA: Insufficient documentation

## 2017-07-07 DIAGNOSIS — R1115 Cyclical vomiting syndrome unrelated to migraine: Secondary | ICD-10-CM | POA: Insufficient documentation

## 2017-07-07 DIAGNOSIS — T7840XA Allergy, unspecified, initial encounter: Secondary | ICD-10-CM | POA: Insufficient documentation

## 2017-07-07 DIAGNOSIS — R110 Nausea: Secondary | ICD-10-CM | POA: Insufficient documentation

## 2017-07-07 MED ORDER — EPINEPHRINE 0.3 MG/0.3ML IJ SOAJ: 0.3000 mg | Freq: Once | INTRAMUSCULAR | 1 refills | Status: AC

## 2017-07-07 NOTE — Telephone Encounter (Signed)
Please let patient know I decided to send over epi pen for as needed use if she were to have an anaphylactic reaction aka problems breathing, swallowing, lip/tongue swelling.

## 2017-07-09 NOTE — Telephone Encounter (Signed)
Left VM with update. Callback provided for any questions.

## 2017-07-09 NOTE — Telephone Encounter (Signed)
Error

## 2017-07-11 ENCOUNTER — Other Ambulatory Visit: Payer: Self-pay | Admitting: Physician Assistant

## 2017-07-12 ENCOUNTER — Encounter: Payer: Self-pay | Admitting: Physician Assistant

## 2017-07-12 DIAGNOSIS — Z91018 Allergy to other foods: Secondary | ICD-10-CM | POA: Insufficient documentation

## 2017-07-12 LAB — ALLERGEN FOOD PROFILE SPECIFIC IGE
Allergen Apple, IgE: <0.1 kU/L
Allergen Tomato, IgE: <0.1 kU/L
Chicken IgE: <0.1 kU/L
Egg White IgE: 0.27 kU/L — AB
IGE (IMMUNOGLOBULIN E), SERUM: 49 [IU]/mL (ref 6–495)
Milk IgE: 0.2 kU/L — AB
Peanut IgE: <0.1 kU/L
SHRIMP IGE: 0.1 kU/L — AB
Tuna: <0.1 kU/L

## 2017-07-12 NOTE — Progress Notes (Signed)
Call pt: Heidi Carney, Heidi Carney came back with IGE response. Not a high response but mild. I still want you to keep appt with allergist.

## 2017-07-15 ENCOUNTER — Other Ambulatory Visit: Payer: Self-pay | Admitting: Physician Assistant

## 2017-07-29 ENCOUNTER — Other Ambulatory Visit: Payer: Self-pay | Admitting: Physician Assistant

## 2017-07-29 DIAGNOSIS — E039 Hypothyroidism, unspecified: Secondary | ICD-10-CM

## 2017-08-21 ENCOUNTER — Other Ambulatory Visit: Payer: Self-pay | Admitting: Physician Assistant

## 2017-08-21 ENCOUNTER — Telehealth: Payer: Self-pay | Admitting: *Deleted

## 2017-08-21 DIAGNOSIS — E039 Hypothyroidism, unspecified: Secondary | ICD-10-CM

## 2017-08-21 NOTE — Telephone Encounter (Signed)
Labs ordered per pt request

## 2017-08-23 LAB — TSH: TSH: 0.45 m[IU]/L

## 2017-08-23 LAB — T3, FREE: T3, Free: 3 pg/mL (ref 2.3–4.2)

## 2017-08-23 LAB — T4, FREE: FREE T4: 1 ng/dL (ref 0.8–1.8)

## 2017-08-23 NOTE — Telephone Encounter (Signed)
Call pt: thyroid in normal range but continues to be low normal or hyperactive level. If feeling fine ok to refill for 6 months.

## 2017-08-24 ENCOUNTER — Other Ambulatory Visit: Payer: Self-pay

## 2017-08-24 DIAGNOSIS — E039 Hypothyroidism, unspecified: Secondary | ICD-10-CM

## 2017-08-24 MED ORDER — SYNTHROID 88 MCG PO TABS: 88.0000 ug | ORAL_TABLET | Freq: Every day | ORAL | 1 refills | Status: DC

## 2017-08-24 NOTE — Telephone Encounter (Signed)
Refilling pt's levothyroxine.  Please see notes on pt's latest labs for dose change.   Directions changed to 1 tab QD except on 1/2 tab for 3 days out of the week

## 2017-08-24 NOTE — Telephone Encounter (Signed)
That is very reasonable. She could decrease 3 days a week. We could get you to more to a 2.00 level TSH and you are at .45.

## 2017-09-15 ENCOUNTER — Other Ambulatory Visit: Payer: Self-pay | Admitting: Physician Assistant

## 2017-09-15 DIAGNOSIS — E039 Hypothyroidism, unspecified: Secondary | ICD-10-CM

## 2017-09-27 ENCOUNTER — Encounter: Payer: Self-pay | Admitting: Physician Assistant

## 2017-09-28 ENCOUNTER — Telehealth: Payer: Self-pay | Admitting: Physician Assistant

## 2017-09-28 DIAGNOSIS — E039 Hypothyroidism, unspecified: Secondary | ICD-10-CM

## 2017-09-28 NOTE — Telephone Encounter (Signed)
Patient calls and seen eye doctor yesterday and concerned as she is having trouble with eye- it is being pushed forward. 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  Pt would like to have an order sent down to lab to get her thyroid levels checked again. KG LPN

## 2017-09-28 NOTE — Telephone Encounter (Signed)
OK I ordered labs to have rechecked. Last check was 522/2019 and she was on the HYPER thyroid side of NORMAL. We did decrease medications at that time. Did you decrease? Lets see how things look with labs now.

## 2017-09-30 NOTE — Telephone Encounter (Signed)
Can we also add thyroglobin and thyroid peroxidase antibody testing to labs.   Let pt known that Thyroid is more HYPO than HYPER right now. Labs do not support hyperthyroidism.

## 2017-10-04 LAB — THYROID ANTIBODIES
Thyroglobulin Ab: 164 IU/mL — ABNORMAL HIGH (ref <=1)
Thyroperoxidase Ab SerPl-aCnc: 25 IU/mL — ABNORMAL HIGH (ref <9)

## 2017-10-04 LAB — TEST AUTHORIZATION

## 2017-10-04 LAB — T4, FREE: Free T4: 0.8 ng/dL (ref 0.8–1.8)

## 2017-10-04 LAB — TSH: TSH: 3.62 m[IU]/L

## 2017-10-04 NOTE — Telephone Encounter (Signed)
Antibodies against thyroid are positive but levels look good. Do you want [place] to send these to the eye doctor?

## 2017-10-06 ENCOUNTER — Other Ambulatory Visit: Payer: Self-pay | Admitting: Physician Assistant

## 2017-10-15 NOTE — Telephone Encounter (Signed)
Results faxed to Hoyle Sauer at [facility], per pt request

## 2017-10-18 ENCOUNTER — Other Ambulatory Visit: Payer: Self-pay | Admitting: Physician Assistant

## 2017-10-18 DIAGNOSIS — F411 Generalized anxiety disorder: Secondary | ICD-10-CM

## 2017-10-30 ENCOUNTER — Other Ambulatory Visit: Payer: Self-pay | Admitting: Physician Assistant

## 2017-10-30 DIAGNOSIS — E039 Hypothyroidism, unspecified: Secondary | ICD-10-CM

## 2017-12-14 ENCOUNTER — Ambulatory Visit: Payer: BC Managed Care – PPO | Admitting: Physician Assistant

## 2017-12-14 ENCOUNTER — Ambulatory Visit (INDEPENDENT_AMBULATORY_CARE_PROVIDER_SITE_OTHER): Payer: BC Managed Care – PPO

## 2017-12-14 ENCOUNTER — Encounter: Payer: Self-pay | Admitting: Physician Assistant

## 2017-12-14 VITALS — BP 145/76 | HR 91 | Ht 64.0 in | Wt 185.0 lb

## 2017-12-14 DIAGNOSIS — E8940 Asymptomatic postprocedural ovarian failure: Secondary | ICD-10-CM

## 2017-12-14 DIAGNOSIS — M152 Bouchard's nodes (with arthropathy): Secondary | ICD-10-CM

## 2017-12-14 DIAGNOSIS — F411 Generalized anxiety disorder: Secondary | ICD-10-CM

## 2017-12-14 DIAGNOSIS — I10 Essential (primary) hypertension: Secondary | ICD-10-CM | POA: Diagnosis not present

## 2017-12-14 DIAGNOSIS — M2560 Stiffness of unspecified joint, not elsewhere classified: Secondary | ICD-10-CM | POA: Diagnosis not present

## 2017-12-14 DIAGNOSIS — M19042 Primary osteoarthritis, left hand: Secondary | ICD-10-CM

## 2017-12-14 DIAGNOSIS — Z79890 Hormone replacement therapy (postmenopausal): Secondary | ICD-10-CM

## 2017-12-14 MED ORDER — VENLAFAXINE HCL ER 75 MG PO CP24: 75.0000 mg | ORAL_CAPSULE | Freq: Every day | ORAL | 1 refills | Status: DC

## 2017-12-14 MED ORDER — DICLOFENAC SODIUM 1 % TD GEL: 4.0000 g | Freq: Four times a day (QID) | TRANSDERMAL | 2 refills | Status: DC

## 2017-12-14 NOTE — Patient Instructions (Signed)
Fish oil Glucosamine chondrotin.  Tumeric to consider.

## 2017-12-14 NOTE — Progress Notes (Signed)
Subjective:    Patient ID: Heidi Carney, female    DOB: 19-Jun-1965, 52 y.o.   MRN: [id number]  HPI  Pt is a 52 yo female with HTN, migraines, GAD who presents to the clinic for follow up.   School has started back and she does feel a tad more anxiety but overall still good. She would like to try brand only effexor. Trying brand only synthroid made a lot of different.   Pt is on HRT and doing well with mood after menopause.   HTN- not on any medication. She had been doing well off. She admits to not eating and exercising like she should. Denies any CP, palpitations, headaches or vision changes.   She has had 2 nodules on left thumb IP joint and left ring finger PIP joint for last year. At times they are painful. She does not remember any pain or injury. She does notice that the thumb nodule gets painful when doing a lot of writing. The ring finger nodule is not painful. She has not done anything to make better.   .. Active Ambulatory Problems    Diagnosis Date Noted  . GAD (generalized anxiety disorder) 01/26/2015  . Elevated blood pressure reading 01/26/2015  . Thyroid activity decreased 01/26/2015  . Atypical chest pain 01/26/2015  . Seborrheic dermatitis 03/10/2015  . Overweight 03/17/2015  . Perimenopausal symptoms 09/13/2015  . Hair loss 11/16/2015  . RLS (restless legs syndrome) 11/16/2015  . B12 deficiency 11/16/2015  . Vitamin D insufficiency 11/16/2015  . Migraine headache without aura 07/10/2016  . Essential hypertension 01/09/2017  . Left lower quadrant pain 01/24/2017  . Slow transit constipation 01/24/2017  . Right ovarian cyst 01/24/2017  . Change in stool 02/12/2017  . Nausea 07/07/2017  . Intractable cyclical vomiting with nausea 07/07/2017  . Diarrhea 07/07/2017  . Abdominal cramping 07/07/2017  . Allergic reaction 07/07/2017  . Multiple food allergies 07/12/2017  . Bouchard's nodes 12/17/2017  . Joint stiffness 12/17/2017  . Primary osteoarthritis of  left hand 12/17/2017  . Surgical 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 replacement therapy 12/17/2017   Resolved Ambulatory Problems    Diagnosis Date Noted  . Benign paroxysmal positional vertigo 01/09/2017   Past Medical History:  Diagnosis Date  . H/O total vaginal hysterectomy 05/2017          Review of Systems See HPI.     Objective:   Physical Exam  Constitutional: She is oriented to person, place, and time. She appears well-developed and well-nourished.  HENT:  Head: Normocephalic and atraumatic.  Cardiovascular: Normal rate and regular rhythm.  Pulmonary/Chest: Effort normal and breath sounds normal.  Musculoskeletal:  Left thumb IP nodule.  Left ring finger PIP nodule.   Both non tender.   Bilateral 5/5 hand strength.   Neurological: She is alert and oriented to person, place, and time.  Psychiatric: She has a normal mood and affect. Her behavior is normal.          Assessment & Plan:    Marland KitchenMarland KitchenDiagnoses and all orders for this visit:  GAD (generalized anxiety disorder) -     venlafaxine XR (EFFEXOR-XR) 75 MG 24 hr capsule; Take 1 capsule (75 mg total) by mouth daily with breakfast.  Bouchard's nodes -     DG Hand Complete Left -     diclofenac sodium (VOLTAREN) 1 % GEL; Apply 4 g topically 4 (four) times daily. To affected joint. -     DG Hand Complete Right  Joint stiffness -  DG Hand Complete Left -     diclofenac sodium (VOLTAREN) 1 % GEL; Apply 4 g topically 4 (four) times daily. To affected joint. -     DG Hand Complete Right  Essential hypertension  Surgical 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 replacement therapy  Primary osteoarthritis of left hand  .Marland Kitchen Depression screen [facility] 2/9 12/14/2017 07/05/2017 01/09/2017 07/10/2016  Decreased Interest 0 0 1 1  Down, Depressed, Hopeless 1 0 0 0  PHQ - 2 Score 1 0 1 1  Altered sleeping 1 - 0 -  Tired, decreased energy 1 - 1 -  Change in appetite 1 - 0 -  Feeling bad or failure about yourself  - - 1 -  Trouble concentrating 0 -  0 -  Moving slowly or fidgety/restless 0 - 0 -  Suicidal thoughts 0 - 0 -  PHQ-9 Score 4 - 3 -  Difficult doing work/chores Somewhat difficult - Not difficult at all -   .. GAD 7 : Generalized Anxiety Score 12/14/2017 09/13/2015  Nervous, Anxious, on Edge 2 1  Control/stop worrying 0 0  Worry too much - different things 2 1  Trouble relaxing 0 0  Restless 1 0  Easily annoyed or irritable 2 1  Afraid - awful might happen 0 0  Total GAD 7 Score 7 3  Anxiety Difficulty Somewhat difficult Not difficult at all     [facility] ordered.   BP elevated will continue to monitor. Discussed lifestyle ways to lower BP. If not improving need to consider medication.   Nodules appear like signs of OA. Will get xrays. Discussed diclofenac gel to use on joints. Consider tumeric/glucosamine chondrotin/tumeric Follow up as needed.   Will call to get path report on colonoscopy.

## 2017-12-16 ENCOUNTER — Encounter: Payer: Self-pay | Admitting: Physician Assistant

## 2017-12-17 ENCOUNTER — Telehealth: Payer: Self-pay | Admitting: Physician Assistant

## 2017-12-17 DIAGNOSIS — M2560 Stiffness of unspecified joint, not elsewhere classified: Secondary | ICD-10-CM | POA: Insufficient documentation

## 2017-12-17 DIAGNOSIS — Z79890 Hormone replacement therapy (postmenopausal): Secondary | ICD-10-CM

## 2017-12-17 DIAGNOSIS — M19041 Primary osteoarthritis, right hand: Secondary | ICD-10-CM | POA: Insufficient documentation

## 2017-12-17 DIAGNOSIS — M19042 Primary osteoarthritis, left hand: Secondary | ICD-10-CM

## 2017-12-17 DIAGNOSIS — M152 Bouchard's nodes (with arthropathy): Secondary | ICD-10-CM | POA: Insufficient documentation

## 2017-12-17 DIAGNOSIS — E8940 Asymptomatic postprocedural ovarian failure: Secondary | ICD-10-CM | POA: Insufficient documentation

## 2017-12-17 NOTE — Progress Notes (Signed)
Call pt: xray overall look good. No rheumatoid and no major osteoarthritis. I still think the nodules represent some osteoarthritis changes. Try diclofenac gel, ice as needed. Could start some hand exercises for osteoarthritis. If bothersome follow up with sports medicine.

## 2017-12-17 NOTE — Telephone Encounter (Signed)
Per patient colonoscopy done June 2019 with Dr. Cindee Salt. Need copy of path and to abstract.

## 2017-12-17 NOTE — Telephone Encounter (Signed)
I have filled out the paperwork to be faxed over to receive those results.

## 2017-12-18 DIAGNOSIS — K635 Polyp of colon: Secondary | ICD-10-CM | POA: Insufficient documentation

## 2017-12-18 DIAGNOSIS — K640 First degree hemorrhoids: Secondary | ICD-10-CM | POA: Insufficient documentation

## 2017-12-18 NOTE — Progress Notes (Signed)
Pt has seen results on MyChart and message also sent for patient to call back if any questions.

## 2017-12-21 ENCOUNTER — Other Ambulatory Visit: Payer: Self-pay | Admitting: Physician Assistant

## 2017-12-21 DIAGNOSIS — E039 Hypothyroidism, unspecified: Secondary | ICD-10-CM

## 2017-12-24 ENCOUNTER — Encounter: Payer: Self-pay | Admitting: Physician Assistant

## 2017-12-30 ENCOUNTER — Other Ambulatory Visit: Payer: Self-pay | Admitting: Physician Assistant

## 2017-12-31 ENCOUNTER — Ambulatory Visit: Payer: BC Managed Care – PPO | Admitting: Physician Assistant

## 2017-12-31 ENCOUNTER — Encounter: Payer: Self-pay | Admitting: Physician Assistant

## 2017-12-31 VITALS — BP 150/75 | HR 91 | Ht 64.0 in | Wt 182.0 lb

## 2017-12-31 DIAGNOSIS — E039 Hypothyroidism, unspecified: Secondary | ICD-10-CM | POA: Diagnosis not present

## 2017-12-31 DIAGNOSIS — Z91018 Allergy to other foods: Secondary | ICD-10-CM | POA: Diagnosis not present

## 2017-12-31 DIAGNOSIS — T7840XD Allergy, unspecified, subsequent encounter: Secondary | ICD-10-CM

## 2017-12-31 DIAGNOSIS — H40052 Ocular hypertension, left eye: Secondary | ICD-10-CM

## 2017-12-31 MED ORDER — LEVOTHYROXINE SODIUM 75 MCG PO CAPS: 1.0000 | ORAL_CAPSULE | Freq: Every day | ORAL | 1 refills | Status: DC

## 2017-12-31 NOTE — Patient Instructions (Signed)
Try visine normal saline in eye.  Try tirosint for 4-6 weeks then check labs.  Will make call to allergist.

## 2017-12-31 NOTE — Progress Notes (Signed)
Subjective:    Patient ID: Heidi Carney, female    DOB: Aug 10, 1965, 52 y.o.   MRN: [id number]  HPI  Pt is a 52 yo female with HTN, migraines, hypothyroidism, and GAD who presents to the clinic to discuss food allergies.   Overall allergic symptoms started back in 09/2016 after eating shrimp and having to go to ED for lip and tongue swelling. She has never had a reaction quite that severe since. She was referred to allergist but never went. Now she is having more low key allergy symptoms with bloating, macular rash, fatigue after eating certain foods. She notices it more with sodas, hot dogs, pizza, and processed foods.   She continues to take levothyroxine 1 tablet every day except 1/2 tablet Monday, Wednesday, Friday. She has see eye doctor before for "eye bulging sensation". She wanted to her to monitor it. She feels like her left eye is bulging.  She stopped levothyroxine and feels much better off of medication.   She is wondering about effexor if it has sulfite in it that could be making her feel worse.   .. Active Ambulatory Problems    Diagnosis Date Noted  . GAD (generalized anxiety disorder) 01/26/2015  . Elevated blood pressure reading 01/26/2015  . Thyroid activity decreased 01/26/2015  . Atypical chest pain 01/26/2015  . Seborrheic dermatitis 03/10/2015  . Overweight 03/17/2015  . Perimenopausal symptoms 09/13/2015  . Hair loss 11/16/2015  . RLS (restless legs syndrome) 11/16/2015  . B12 deficiency 11/16/2015  . Vitamin D insufficiency 11/16/2015  . Migraine headache without aura 07/10/2016  . Essential hypertension 01/09/2017  . Left lower quadrant pain 01/24/2017  . Slow transit constipation 01/24/2017  . Right ovarian cyst 01/24/2017  . Change in stool 02/12/2017  . Nausea 07/07/2017  . Intractable cyclical vomiting with nausea 07/07/2017  . Diarrhea 07/07/2017  . Abdominal cramping 07/07/2017  . Allergic reaction 07/07/2017  . Multiple food allergies  07/12/2017  . Bouchard's nodes 12/17/2017  . Joint stiffness 12/17/2017  . Primary osteoarthritis of left hand 12/17/2017  . 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 Colon polyp 12/18/2017  . Grade I internal hemorrhoids 12/18/2017   Resolved Ambulatory Problems    Diagnosis Date Noted  . Benign paroxysmal positional vertigo 01/09/2017   Past Medical History:  Diagnosis Date  . H/O total vaginal hysterectomy 05/2017        Review of Systems  All other systems reviewed and are negative.      Objective:   Physical Exam  Constitutional: She is oriented to person, place, and time. She appears well-developed and well-nourished.  HENT:  Head: Normocephalic and atraumatic.  Right Ear: External ear normal.  Left Ear: External ear normal.  Nose: Nose normal.  Mouth/Throat: Oropharynx is clear and moist. No oropharyngeal exudate.  Eyes: Pupils are equal, round, and reactive to light. EOM are normal. Right eye exhibits no discharge. Left eye exhibits no discharge.  Injected left eye under lower lid.   Neck: Normal range of motion.  Cardiovascular: Normal rate and regular rhythm.  Pulmonary/Chest: Effort normal and breath sounds normal.  Lymphadenopathy:    She has no cervical adenopathy.  Neurological: She is alert and oriented to person, place, and time.  Skin: No rash noted.  Psychiatric: She has a normal mood and affect. Her behavior is normal.          Assessment & Plan:  Marland KitchenMarland KitchenDiagnoses and all orders for this visit:  Allergic symptoms, subsequent encounter -  Ambulatory referral to Allergy  Multiple food allergies -     Ambulatory referral to Allergy  Acquired hypothyroidism -     Levothyroxine Sodium (TIROSINT) 75 MCG CAPS; Take 1 capsule (75 mcg total) by mouth daily before breakfast.   Pt needs to see allergist. Referral made.   Certainly continue to keep food diary and avoid foods that seem to be triggering symptoms. If having any  anaphyaltic symptoms use epipen. She could consider low dose anti-histamine but if she is going to be allergy tested they will want a 2 week clean out.  Will see if patient tolerates a cleaner thyroid medication tirosint. I do think she needs to stay on thyroid medication. If she does not I fear she will begin to be symptomatic with those symptoms. Recheck TSH in 4-6 weeks.   Encouraged her to follow up with eye doctor reguarding left eye pressure. I do not see any worrisome signs of features. Looks a little irritated. Try visine.   Marland Kitchen.Spent 30 minutes with patient and greater than 50 percent of visit spent counseling patient regarding treatment plan.

## 2018-01-02 ENCOUNTER — Encounter: Payer: Self-pay | Admitting: Physician Assistant

## 2018-01-02 DIAGNOSIS — H40052 Ocular hypertension, left eye: Secondary | ICD-10-CM | POA: Insufficient documentation

## 2018-01-02 NOTE — Telephone Encounter (Signed)
Received fax from [employer] that levothyroxine sodium requires a PA. Information has been sent to the insurance company. Awaiting determination.

## 2018-01-03 NOTE — Telephone Encounter (Signed)
I received a fax from [facility] that [place] was not covered and I have given all the information about the reactions to the brand name and insurance is still not willing to cover. I have appealed the information and still has been denied. Please advise.

## 2018-01-04 NOTE — Telephone Encounter (Signed)
Called [facility] [facility], spoke with [place]. Received authorization for Tirosint 65mcg. Auth: [id number]. Valid: 01/04/18-01/05/19.  [facility], spoke with [place]. Advised of approval. He advised Pt picked up Rx on 01/02/18, but did make note of authorization in her file.  Left VM for Pt with status update. Will also route response via MyChart.

## 2018-01-04 NOTE — Telephone Encounter (Signed)
YOU ARE THE BEST!

## 2018-02-06 ENCOUNTER — Ambulatory Visit: Payer: BC Managed Care – PPO | Admitting: Allergy and Immunology

## 2018-02-06 ENCOUNTER — Encounter: Payer: Self-pay | Admitting: Allergy and Immunology

## 2018-02-06 VITALS — BP 152/86 | HR 80 | Temp 98.2°F | Resp 16 | Ht 64.0 in | Wt 174.4 lb

## 2018-02-06 DIAGNOSIS — L500 Allergic urticaria: Secondary | ICD-10-CM | POA: Diagnosis not present

## 2018-02-06 DIAGNOSIS — R768 Other specified abnormal immunological findings in serum: Secondary | ICD-10-CM

## 2018-02-06 DIAGNOSIS — T7840XD Allergy, unspecified, subsequent encounter: Secondary | ICD-10-CM

## 2018-02-06 DIAGNOSIS — T783XXD Angioneurotic edema, subsequent encounter: Secondary | ICD-10-CM | POA: Diagnosis not present

## 2018-02-06 DIAGNOSIS — K9049 Malabsorption due to intolerance, not elsewhere classified: Secondary | ICD-10-CM | POA: Diagnosis not present

## 2018-02-06 DIAGNOSIS — J310 Chronic rhinitis: Secondary | ICD-10-CM | POA: Diagnosis not present

## 2018-02-06 DIAGNOSIS — T783XXA Angioneurotic edema, initial encounter: Secondary | ICD-10-CM | POA: Insufficient documentation

## 2018-02-06 MED ORDER — EPINEPHRINE 0.3 MG/0.3ML IJ SOAJ: INTRAMUSCULAR | 1 refills | Status: DC

## 2018-02-06 NOTE — Progress Notes (Signed)
New Patient Note  RE: Heidi Carney MRN: [id number] DOB: 1966/01/06 Date of Office Visit: 02/06/2018  Referring provider: Lavada Mesi Primary care provider: Lavada Mesi  Chief Complaint: Allergic Reaction; Angioedema; and Food Intolerance   History of present illness: Heidi Carney is a 52 y.o. female seen today in consultation requested by Iran Planas, PA-C.  She reports that approximately 18 months ago she consumed shrimp and scallops and approximately 90 minutes later developed "major cramps" as well as nausea, vomiting, diarrhea, tongue swelling, and the sensation of facial flushing.  The symptoms resolved within a few hours of taking diphenhydramine.  Approximately 8 months ago, she consumed a Cobb salad and an hour or 2 later felt "cold and clammy", he came nauseated, vomited, and had diarrhea.  Approximately 6 months ago she consumed beef tips, rice, salad, and had symptoms as listed above, however she also became dizzy and felt like she was going to pass out.  Over the past few months, she has experienced episodes of abdominal bloating and generalized pruritus after eating pizza, donuts, and other "junk food".  With a food journal she began to realize that on many of these occasions the foods contained sulfites, for example the cup salad that she had consumed and had been washed with a "veggie wash" containing sulfites.  She experienced facial pruritus after taking Synthroid and her T4 replacement was switched to a medication without sulfites.  In mid September, she woke up in the morning with angioedema of the lip as well as pruritus of the forearms.  She does not recall what she had consumed for dinner the night before.  She reports that she has attempted to remove sulfites from her diet and that her symptoms seem to have improved.  Assessment and plan: Allergic reaction The patients history suggests allergic reaction with an unclear trigger. Food allergen skin  tests were negative today despite a positive histamine control. The negative predictive value for skin tests is excellent (greater than 95%). We will proceed with in vitro lab studies to help establish an etiology.  The following labs have been ordered: Baseline serum tryptase, CBC, CMP, ESR, ANA, and serum specific IgE against shellfish panel, and alpha gal panel.    Should symptoms recur, a journal is to be kept recording any foods eaten, beverages consumed, medications taken within a 6 hour period prior to the onset of symptoms, as well as activities performed, and environmental conditions. For any symptoms concerning for anaphylaxis, epinephrine is to be administered and 911 is to be called immediately.  A prescription has been provided for epinephrine autoinjector (Auvi-Q) 2 pack along with instructions for its proper administration.  For now, attempt to avoid sulfite.  A sulfite avoidance sheet has bene provided.   Meds ordered this encounter  Medications  . EPINEPHrine (AUVI-Q) 0.3 mg/0.3 mL IJ SOAJ injection    Sig: Use as directed for severe allergic reactions.    Dispense:  4 Device    Refill:  1    Diagnostics: Environmental skin testing: Negative despite a positive histamine control. Food allergen skin testing: Negative despite a positive histamine control.    Physical examination: Blood pressure (!) 152/86, pulse 80, temperature 98.2 F (36.8 C), temperature source Oral, resp. rate 16, height '5\' 4"'$  (1.626 m), weight 174 lb 6.1 oz (79.1 kg), last menstrual period 01/21/2015, SpO2 96 %.  General: Alert, interactive, in no acute distress. HEENT: TMs pearly gray, turbinates moderately edematous without discharge, post-pharynx mildly  erythematous. Neck: Supple without lymphadenopathy. Lungs: Clear to auscultation without wheezing, rhonchi or rales. CV: Normal S1, S2 without murmurs. Abdomen: Nondistended, nontender. Skin: Warm and dry, without lesions or  rashes. Extremities:  No clubbing, cyanosis or edema. Neuro:   Grossly intact.  Review of systems:  Review of systems negative except as noted in HPI / PMHx or noted below: Review of Systems  Constitutional: Negative.   HENT: Negative.   Eyes: Negative.   Respiratory: Negative.   Cardiovascular: Negative.   Gastrointestinal: Negative.   Genitourinary: Negative.   Musculoskeletal: Negative.   Skin: Negative.   Neurological: Negative.   Endo/Heme/Allergies: Negative.   Psychiatric/Behavioral: Negative.     Past medical history:  Past Medical History:  Diagnosis Date  . Anxiety   . H/O total vaginal hysterectomy 05/2017  . Hypothyroidism     Past surgical history:  Past Surgical History:  Procedure Laterality Date  . CESAREAN SECTION  2012  . EYE SURGERY    . LAPAROSCOPIC TOTAL HYSTERECTOMY  05/2017    Family history: Family History  Problem Relation Age of Onset  . Diabetes Father   . Cancer Father        prostate CA  . Stroke Father   . Asthma Sister   . Allergic rhinitis Neg Hx   . Angioedema Neg Hx   . Eczema Neg Hx   . Immunodeficiency Neg Hx   . Urticaria Neg Hx     Social history: Social History   Socioeconomic History  . Marital status: Married    Spouse name: Not on file  . Number of children: Not on file  . Years of education: Not on file  . Highest education level: Not on file  Occupational History  . Not on file  Social Needs  . Financial resource strain: Not on file  . Food insecurity:    Worry: Not on file    Inability: Not on file  . Transportation needs:    Medical: Not on file    Non-medical: Not on file  Tobacco Use  . Smoking status: Never Smoker  . Smokeless tobacco: Never Used  Substance and Sexual Activity  . Alcohol use: Yes  . Drug use: No  . Sexual activity: Yes  Lifestyle  . Physical activity:    Days per week: Not on file    Minutes per session: Not on file  . Stress: Not on file  Relationships  . Social  connections:    Talks on phone: Not on file    Gets together: Not on file    Attends religious service: Not on file    Active member of club or organization: Not on file    Attends meetings of clubs or organizations: Not on file    Relationship status: Not on file  . Intimate partner violence:    Fear of current or ex partner: Not on file    Emotionally abused: Not on file    Physically abused: Not on file    Forced sexual activity: Not on file  Other Topics Concern  . Not on file  Social History Narrative  . Not on file   Environmental History: The patient lives in a 50 year old home with carpeting throughout and central air/heat.  There is a dog in the home which has access to her bedroom.  There is no known mold/water damage in the home.  She is a non-smoker.  Allergies as of 02/06/2018      Reactions   Shrimp [  shellfish Allergy]    Lip and tongue swelling.    Celexa [citalopram Hydrobromide]    Made feel anxiety worse   Effexor [venlafaxine]    Blurry vision   Sulfur    Possible reaction with lip tingling/SOB/itching   Synthroid [levothyroxine Sodium]    Facial itching      Medication List        Accurate as of 02/06/18 12:17 PM. Always use your most recent med list.          BIOTIN PO Take 2,500 mcg by mouth daily.   cholecalciferol 1000 units tablet Commonly known as:  VITAMIN D Take 1,000 Units by mouth daily.   cyanocobalamin 1000 MCG/ML injection Commonly known as:  (VITAMIN B-12) INJECT 1ML INTO THE MUSCLE EVERY 30 DAYS   diclofenac sodium 1 % Gel Commonly known as:  VOLTAREN Apply 4 g topically 4 (four) times daily. To affected joint.   EPINEPHrine 0.3 mg/0.3 mL Soaj injection Commonly known as:  EPI-PEN epinephrine 0.3 mg/0.3 mL injection, auto-injector   EPINEPHrine 0.3 mg/0.3 mL Soaj injection Commonly known as:  EPI-PEN Use as directed for severe allergic reactions.   ibuprofen 800 MG tablet Commonly known as:  ADVIL,MOTRIN ibuprofen 800  mg tablet   IRON PO Take by mouth.   Levothyroxine Sodium 75 MCG Caps Take 1 capsule (75 mcg total) by mouth daily before breakfast.   nystatin-triamcinolone cream Commonly known as:  MYCOLOG II Apply 1 application topically as needed.   promethazine 25 MG tablet Commonly known as:  PHENERGAN Take 1 tablet (25 mg total) by mouth every 6 (six) hours as needed for nausea or vomiting.   triamcinolone ointment 0.5 % Commonly known as:  KENALOG APPLY TOPICALLY AS NEEDED   venlafaxine XR 75 MG 24 hr capsule Commonly known as:  EFFEXOR-XR Take 1 capsule (75 mg total) by mouth daily with breakfast.       Known medication allergies: Allergies  Allergen Reactions  . Shrimp [Shellfish Allergy]     Lip and tongue swelling.   Sheliah Hatch [Citalopram Hydrobromide]     Made feel anxiety worse  . Effexor [Venlafaxine]     Blurry vision  . Sulfur     Possible reaction with lip tingling/SOB/itching  . Synthroid [Levothyroxine Sodium]     Facial itching    I appreciate the opportunity to take part in Karsen's care. Please do not hesitate to contact me with questions.  Sincerely,   R. Edgar Frisk, MD

## 2018-02-06 NOTE — Assessment & Plan Note (Addendum)
The patients history suggests allergic reaction with an unclear trigger. Food allergen skin tests were negative today despite a positive histamine control. The negative predictive value for skin tests is excellent (greater than 95%). We will proceed with in vitro lab studies to help establish an etiology.  The following labs have been ordered: Baseline serum tryptase, CBC, CMP, ESR, ANA, and serum specific IgE against shellfish panel, and alpha gal panel.    Should symptoms recur, a journal is to be kept recording any foods eaten, beverages consumed, medications taken within a 6 hour period prior to the onset of symptoms, as well as activities performed, and environmental conditions. For any symptoms concerning for anaphylaxis, epinephrine is to be administered and 911 is to be called immediately.  A prescription has been provided for epinephrine autoinjector (Auvi-Q) 2 pack along with instructions for its proper administration.  For now, attempt to avoid sulfite.  A sulfite avoidance sheet has bene provided.

## 2018-02-06 NOTE — Patient Instructions (Addendum)
Allergic reaction The patients history suggests allergic reaction with an unclear trigger. Food allergen skin tests were negative today despite a positive histamine control. The negative predictive value for skin tests is excellent (greater than 95%). We will proceed with in vitro lab studies to help establish an etiology.  The following labs have been ordered: Baseline serum tryptase, CBC, CMP, ESR, ANA, and serum specific IgE against shellfish panel, and alpha gal panel.    Should symptoms recur, a journal is to be kept recording any foods eaten, beverages consumed, medications taken within a 6 hour period prior to the onset of symptoms, as well as activities performed, and environmental conditions. For any symptoms concerning for anaphylaxis, epinephrine is to be administered and 911 is to be called immediately.  A prescription has been provided for epinephrine autoinjector (Auvi-Q) 2 pack along with instructions for its proper administration.  For now, attempt to avoid sulfite.  A sulfite avoidance sheet has bene provided.   When lab results have returned the patient will be called with further recommendations and follow up instructions.

## 2018-02-07 ENCOUNTER — Encounter: Payer: Self-pay | Admitting: Physician Assistant

## 2018-02-07 DIAGNOSIS — E039 Hypothyroidism, unspecified: Secondary | ICD-10-CM

## 2018-02-08 LAB — T4, FREE: FREE T4: 1.1 ng/dL (ref 0.8–1.8)

## 2018-02-08 LAB — TSH: TSH: 0.61 mIU/L

## 2018-02-08 LAB — T3, FREE: T3, Free: 3.4 pg/mL (ref 2.3–4.2)

## 2018-02-11 ENCOUNTER — Ambulatory Visit: Payer: Self-pay | Admitting: Pediatrics

## 2018-02-11 LAB — CBC WITH DIFFERENTIAL/PLATELET
Basophils Absolute: 0 10*3/uL (ref 0.0–0.2)
Basos: 0 %
EOS (ABSOLUTE): 0.4 10*3/uL (ref 0.0–0.4)
Eos: 8 %
Hematocrit: 41.8 % (ref 34.0–46.6)
Hemoglobin: 14.4 g/dL (ref 11.1–15.9)
Immature Grans (Abs): 0 10*3/uL (ref 0.0–0.1)
Immature Granulocytes: 0 %
Lymphocytes Absolute: 2 10*3/uL (ref 0.7–3.1)
Lymphs: 36 %
MCH: 35.6 pg — ABNORMAL HIGH (ref 26.6–33.0)
MCHC: 34.4 g/dL (ref 31.5–35.7)
MCV: 103 fL — ABNORMAL HIGH (ref 79–97)
Monocytes Absolute: 0.6 10*3/uL (ref 0.1–0.9)
Monocytes: 10 %
Neutrophils Absolute: 2.6 10*3/uL (ref 1.4–7.0)
Neutrophils: 46 %
Platelets: 177 10*3/uL (ref 150–450)
RBC: 4.05 x10E6/uL (ref 3.77–5.28)
RDW: 11.4 % — ABNORMAL LOW (ref 12.3–15.4)
WBC: 5.6 10*3/uL (ref 3.4–10.8)

## 2018-02-11 LAB — COMPREHENSIVE METABOLIC PANEL
ALT: 23 IU/L (ref 0–32)
AST: 23 IU/L (ref 0–40)
Albumin/Globulin Ratio: 1.5 (ref 1.2–2.2)
Albumin: 4.4 g/dL (ref 3.5–5.5)
Alkaline Phosphatase: 87 IU/L (ref 39–117)
BUN/Creatinine Ratio: 8 — ABNORMAL LOW (ref 9–23)
BUN: 8 mg/dL (ref 6–24)
Bilirubin Total: 0.4 mg/dL (ref 0.0–1.2)
CO2: 26 mmol/L (ref 20–29)
Calcium: 9.4 mg/dL (ref 8.7–10.2)
Chloride: 101 mmol/L (ref 96–106)
Creatinine, Ser: 0.97 mg/dL (ref 0.57–1.00)
GFR calc Af Amer: 79 mL/min/{1.73_m2} (ref >59)
GFR calc non Af Amer: 68 mL/min/{1.73_m2} (ref >59)
Globulin, Total: 3 g/dL (ref 1.5–4.5)
Glucose: 99 mg/dL (ref 65–99)
Potassium: 4.2 mmol/L (ref 3.5–5.2)
Sodium: 141 mmol/L (ref 134–144)
Total Protein: 7.4 g/dL (ref 6.0–8.5)

## 2018-02-11 LAB — ALPHA-GAL PANEL
Alpha Gal IgE*: <0.1 kU/L (ref <0.10)
Beef (Bos spp) IgE: 0.29 kU/L (ref <0.35)
Class Interpretation: 0
Lamb/Mutton (Ovis spp) IgE: 0.26 kU/L (ref <0.35)
Pork (Sus spp) IgE: <0.1 kU/L (ref <0.35)

## 2018-02-11 LAB — ALLERGEN PROFILE, SHELLFISH
Clam IgE: <0.1 kU/L
F023-IgE Crab: <0.1 kU/L
F080-IgE Lobster: <0.1 kU/L
F290-IgE Oyster: <0.1 kU/L
Scallop IgE: <0.1 kU/L
Shrimp IgE: <0.1 kU/L

## 2018-02-11 LAB — ANA W/REFLEX IF POSITIVE
Anti JO-1: <0.2 AI (ref 0.0–0.9)
Anti Nuclear Antibody(ANA): POSITIVE — AB
Centromere Ab Screen: <0.2 AI (ref 0.0–0.9)
Chromatin Ab SerPl-aCnc: <0.2 AI (ref 0.0–0.9)
ENA RNP Ab: 0.4 AI (ref 0.0–0.9)
ENA SM Ab Ser-aCnc: <0.2 AI (ref 0.0–0.9)
ENA SSA (RO) Ab: >8 AI — ABNORMAL HIGH (ref 0.0–0.9)
ENA SSB (LA) Ab: <0.2 AI (ref 0.0–0.9)
Scleroderma SCL-70: <0.2 AI (ref 0.0–0.9)
dsDNA Ab: <1 IU/mL (ref 0–9)

## 2018-02-11 LAB — SEDIMENTATION RATE: Sed Rate: 26 mm/hr (ref 0–40)

## 2018-02-11 LAB — TRYPTASE: Tryptase: 9.3 ug/L (ref 2.2–13.2)

## 2018-02-11 NOTE — Telephone Encounter (Signed)
Call pt: thyroid is in normal range as well as free levels. How do you feel? Do you need refills?

## 2018-02-11 NOTE — Telephone Encounter (Signed)
Are you still doing tirosinct right? I don't mind decreasing to 75mcg that is the next dose down. Are you ok with that?

## 2018-02-12 ENCOUNTER — Encounter: Payer: Self-pay | Admitting: Physician Assistant

## 2018-02-13 NOTE — Progress Notes (Signed)
Is she ok with rheumatology referral for positive ANA which is an autoimmune lab?   No other allergies seen except to shrimp.

## 2018-02-15 NOTE — Addendum Note (Signed)
Addended by: Donella Stade on: 02/15/2018 03:04 PM   Modules accepted: Orders

## 2018-02-15 NOTE — Telephone Encounter (Signed)
I called patient and she voices understanding about the Levothyroxine dose change and she does not want the referral to Rheumatology at this time.

## 2018-02-15 NOTE — Telephone Encounter (Signed)
I want her to do what I told her via mychart. One full tablet one day then next day 1/2 tablet.

## 2018-02-24 ENCOUNTER — Other Ambulatory Visit: Payer: Self-pay | Admitting: Physician Assistant

## 2018-02-24 DIAGNOSIS — E039 Hypothyroidism, unspecified: Secondary | ICD-10-CM

## 2018-03-07 ENCOUNTER — Other Ambulatory Visit: Payer: Self-pay | Admitting: Physician Assistant

## 2018-03-07 DIAGNOSIS — F411 Generalized anxiety disorder: Secondary | ICD-10-CM

## 2018-03-12 ENCOUNTER — Ambulatory Visit: Payer: BC Managed Care – PPO | Admitting: Allergy and Immunology

## 2018-03-12 ENCOUNTER — Encounter: Payer: Self-pay | Admitting: Allergy and Immunology

## 2018-03-12 ENCOUNTER — Encounter: Payer: Self-pay | Admitting: Physician Assistant

## 2018-03-12 VITALS — BP 142/80 | HR 98 | Resp 16

## 2018-03-12 DIAGNOSIS — L500 Allergic urticaria: Secondary | ICD-10-CM

## 2018-03-12 DIAGNOSIS — T7840XD Allergy, unspecified, subsequent encounter: Secondary | ICD-10-CM | POA: Diagnosis not present

## 2018-03-12 MED ORDER — LEVOCETIRIZINE DIHYDROCHLORIDE 5 MG PO TABS: 5.0000 mg | ORAL_TABLET | Freq: Every evening | ORAL | 5 refills | Status: DC

## 2018-03-12 NOTE — Progress Notes (Signed)
Follow-up Note  RE: Haileigh Pitz MRN: [id number] DOB: 1965/09/26 Date of Office Visit: 03/12/2018  Primary care provider: Lavada Mesi Referring provider: Donella Stade, PA-C  History of present illness: Tinsley Everman is a 52 y.o. female with history of allergic reactions presenting today for follow-up.  She was previously seen in this clinic for her initial evaluation on February 06, 2018.  She reports that she still experiences pruritus of the forearms on a regular basis without identified triggers.  She reports that a few weeks ago she consumed a small bite of an egg omelette approximately 90 minutes later felt chest tightness and became anxious.  She took diphenhydramine and the symptoms resolved without further intervention.  She did not experience urticaria, angioedema, or gastrointestinal symptoms.  With the exception of a positive ANA, her labs and allergy skin tests were unrevealing.  She is scheduled to see a rheumatologist in January.  Assessment and plan: Allergic reaction  I have recommended taking levocetirizine 5 mg daily.  To avoid diminishing benefit with daily use (tachyphylaxis) of second generation antihistamine, consider alternating every few months between fexofenadine (Allegra) and levocetirizine (Xyzal).  Instructions have been discussed and provided for H1/H2 receptor blockade with titration to find lowest effective dose.  A laboratory order form has been provided for serum specific IgE against egg white, egg yolk, and egg components.  Should symptoms recur, a journal is to be kept recording any foods eaten, beverages consumed, and medications taken within a 6 hour time period prior to the onset of symptoms, as well as record activities being performed, and environmental conditions. For any symptoms concerning for anaphylaxis, epinephrine is to be administered and 911 is to be called immediately.  Keep appointment with rheumatologist in  January.  If the rheumatology evaluation is unrevealing further evaluation at a multispecialty center may be warranted.   Meds ordered this encounter  Medications  . levocetirizine (XYZAL) 5 MG tablet    Sig: Take 1 tablet (5 mg total) by mouth every evening.    Dispense:  30 tablet    Refill:  5    Physical examination: Blood pressure (!) 142/80, pulse 98, resp. rate 16, last menstrual period 01/21/2015, SpO2 94 %.  General: Alert, interactive, in no acute distress. Neck: Supple without lymphadenopathy. Lungs: Clear to auscultation without wheezing, rhonchi or rales. CV: Normal S1, S2 without murmurs. Skin: Warm and dry, without lesions or rashes.  The following portions of the patient's history were reviewed and updated as appropriate: allergies, current medications, past family history, past medical history, past social history, past surgical history and problem list.  Allergies as of 03/12/2018      Reactions   Shrimp [shellfish Allergy]    Lip and tongue swelling.    Celexa [citalopram Hydrobromide]    Made feel anxiety worse   Effexor [venlafaxine]    Blurry vision   Sulfur    Possible reaction with lip tingling/SOB/itching   Synthroid [levothyroxine Sodium]    Facial itching      Medication List        Accurate as of 03/12/18 12:58 PM. Always use your most recent med list.          cyanocobalamin 1000 MCG/ML injection Commonly known as:  (VITAMIN B-12) INJECT 1ML INTO THE MUSCLE EVERY 30 DAYS   diclofenac sodium 1 % Gel Commonly known as:  VOLTAREN Apply 4 g topically 4 (four) times daily. To affected joint.   EPINEPHrine 0.3 mg/0.3 mL Soaj  injection Commonly known as:  EPI-PEN Use as directed for severe allergic reactions.   levocetirizine 5 MG tablet Commonly known as:  XYZAL Take 1 tablet (5 mg total) by mouth every evening.   nystatin-triamcinolone cream Commonly known as:  MYCOLOG II Apply 1 application topically as needed.   TIROSINT 75  MCG Caps Generic drug:  Levothyroxine Sodium TAKE 1 CAPSULE (75 MCG TOTAL) BY MOUTH DAILY BEFORE BREAKFAST.   triamcinolone ointment 0.5 % Commonly known as:  KENALOG APPLY TOPICALLY AS NEEDED   venlafaxine XR 75 MG 24 hr capsule Commonly known as:  EFFEXOR-XR Take 1 capsule (75 mg total) by mouth daily with breakfast.       Allergies  Allergen Reactions  . Shrimp [Shellfish Allergy]     Lip and tongue swelling.   Sheliah Hatch [Citalopram Hydrobromide]     Made feel anxiety worse  . Effexor [Venlafaxine]     Blurry vision  . Sulfur     Possible reaction with lip tingling/SOB/itching  . Synthroid [Levothyroxine Sodium]     Facial itching    I appreciate the opportunity to take part in Rocky's care. Please do not hesitate to contact me with questions.  Sincerely,   R. Edgar Frisk, MD

## 2018-03-12 NOTE — Assessment & Plan Note (Signed)
   I have recommended taking levocetirizine 5 mg daily.  To avoid diminishing benefit with daily use (tachyphylaxis) of second generation antihistamine, consider alternating every few months between fexofenadine (Allegra) and levocetirizine (Xyzal).  Instructions have been discussed and provided for H1/H2 receptor blockade with titration to find lowest effective dose.  A laboratory order form has been provided for serum specific IgE against egg white, egg yolk, and egg components.  Should symptoms recur, a journal is to be kept recording any foods eaten, beverages consumed, and medications taken within a 6 hour time period prior to the onset of symptoms, as well as record activities being performed, and environmental conditions. For any symptoms concerning for anaphylaxis, epinephrine is to be administered and 911 is to be called immediately.  Keep appointment with rheumatologist in January.  If the rheumatology evaluation is unrevealing further evaluation at a multispecialty center may be warranted.

## 2018-03-12 NOTE — Patient Instructions (Addendum)
Allergic reactions  I have recommended taking levocetirizine 5 mg daily.  To avoid diminishing benefit with daily use (tachyphylaxis) of second generation antihistamine, consider alternating every few months between fexofenadine (Allegra) and levocetirizine (Xyzal).  Instructions have been discussed and provided for H1/H2 receptor blockade with titration to find lowest effective dose.  A laboratory order form has been provided for serum specific IgE against egg white, egg yolk, and egg components.  Should symptoms recur, a journal is to be kept recording any foods eaten, beverages consumed, and medications taken within a 6 hour time period prior to the onset of symptoms, as well as record activities being performed, and environmental conditions. For any symptoms concerning for anaphylaxis, epinephrine is to be administered and 911 is to be called immediately.  Keep appointment with rheumatologist in January.  If the rheumatology evaluation is unrevealing further evaluation at a multispecialty center may be warranted.  Urticaria (Hives)  . Levocetirizine (Xyzal) 5 mg twice a day and famotidine (Pepcid) 20 mg twice a day. If no symptoms for 7-14 days then decrease to. . Levocetirizine (Xyzal) 5 mg twice a day and famotidine (Pepcid) 20 mg once a day.  If no symptoms for 7-14 days then decrease to. . Levocetirizine (Xyzal) 5 mg twice a day.  If no symptoms for 7-14 days then decrease to. . Levocetirizine (Xyzal) 5 mg once a day.  May use Benadryl (diphenhydramine) as needed for breakthrough symptoms       If symptoms return, then step up dosage

## 2018-03-15 ENCOUNTER — Ambulatory Visit: Payer: BC Managed Care – PPO | Admitting: Family Medicine

## 2018-03-15 ENCOUNTER — Encounter: Payer: Self-pay | Admitting: Family Medicine

## 2018-03-15 VITALS — BP 142/88 | HR 92 | Ht 64.0 in | Wt 172.0 lb

## 2018-03-15 DIAGNOSIS — K9049 Malabsorption due to intolerance, not elsewhere classified: Secondary | ICD-10-CM | POA: Diagnosis not present

## 2018-03-15 DIAGNOSIS — E039 Hypothyroidism, unspecified: Secondary | ICD-10-CM | POA: Diagnosis not present

## 2018-03-15 DIAGNOSIS — Z23 Encounter for immunization: Secondary | ICD-10-CM | POA: Diagnosis not present

## 2018-03-15 DIAGNOSIS — I10 Essential (primary) hypertension: Secondary | ICD-10-CM

## 2018-03-15 DIAGNOSIS — R030 Elevated blood-pressure reading, without diagnosis of hypertension: Secondary | ICD-10-CM

## 2018-03-15 DIAGNOSIS — F411 Generalized anxiety disorder: Secondary | ICD-10-CM

## 2018-03-15 MED ORDER — VENLAFAXINE HCL ER 150 MG PO CP24: 150.0000 mg | ORAL_CAPSULE | Freq: Every day | ORAL | 0 refills | Status: DC

## 2018-03-15 NOTE — Patient Instructions (Addendum)
Please track your blood pressures daily, or every other day for the next 2 weeks.  Then come in for a nurse appointment so that we can check your blood pressure here.  Bring your home cuff and her home readings with you so that we can evaluate those to see if you may actually have some underlying hypertension or if you may have whitecoat hypertension.  Schedule follow up with Jade in 8 weeks.

## 2018-03-15 NOTE — Progress Notes (Signed)
Subjective:    CC: F/U mood -want to increase her Effexor.   HPI:  F/U GAD -she has been a little stressed and a little overwhelmed recently.  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  Also recently she is been stressed with dealing with some of her food allergies.  She has been to see the allergist a couple of times and she feels like it is probably sulfites but unfortunately there is no great way to test for this.  She had a couple incidences where she would get redness itching on her skin and even vomited and had diarrhea after eating foods that contain sulfites.  She said more recently she ate a little bit of egg which she had not done in months and felt bad after eating it.  Also been stressed because her house has to be sold and they have to find a new one because the [employer] is buying her home to extend the road here in [place].  Evaded blood pressure- Pt denies chest pain, SOB, dizziness, or heart palpitations.  Taking meds as directed w/o problems.  Denies medication side effects.  Actually worked hard to lose about 15 pounds and has done well.  Hypothyroidism-her last TSH was just a little overly suppressed at 0.6 back in November.  Her PCP adjusted her medication so she is been taking a half a tab every other day and a whole tab in between.  Is now been 4 weeks and she is due to recheck those levels.  Past medical history, Surgical history, Family history not pertinant except as noted below, Social history, Allergies, and medications have been entered into the medical record, reviewed, and corrections made.   Review of Systems: No fevers, chills, night sweats, weight loss, chest pain, or shortness of breath.   Objective:    General: Well Developed, well nourished, and in no acute distress.  Neuro: Alert and oriented x3, extra-ocular muscles intact, sensation grossly intact.  HEENT: Normocephalic, atraumatic borderline thyromegaly. Skin: Warm and  dry, no rashes. Cardiac: Regular rate and rhythm, no murmurs rubs or gallops, no lower extremity edema.  Respiratory: Clear to auscultation bilaterally. Not using accessory muscles, speaking in full sentences.   Impression and Recommendations:    GAD -would like increasing her Effexor so new prescription sent to the pharmacy 450 mg.  Encouraged her to follow-up with her PCP in about 8 weeks to make sure that the change is working well.  HTN -pressure is elevated again.  When I look back she is actually had high blood pressure for quite some time.  She thinks she may have whitecoat hypertension so we discussed checking her blood pressures at home, keeping a log and then bring those in along with her home cuff within 2 weeks to visit with the nurse.  At that time we can verify if her home cuff is accurate and if the home blood pressures look good.  We discussed the importance of treating hypertension if it is real.  Food intolerance -she does follow with allergy and immunology.  At this point they do not really have a great way to test for sulfite so we just discussed trying to minimize and avoided in her diet as much as possible especially she is pretty sure that that is a trigger for her.  Hypothyroidism -plan to recheck TSH.  She has been on her new dose for about 4 weeks.  Discussed optimal  goal for TSH is between 1-2.

## 2018-03-19 LAB — TSH: TSH: 3.5 mIU/L

## 2018-03-20 ENCOUNTER — Other Ambulatory Visit: Payer: Self-pay | Admitting: Physician Assistant

## 2018-03-20 DIAGNOSIS — E039 Hypothyroidism, unspecified: Secondary | ICD-10-CM

## 2018-03-20 MED ORDER — LEVOTHYROXINE SODIUM 75 MCG PO CAPS: ORAL_CAPSULE | ORAL | 1 refills | Status: DC

## 2018-03-20 NOTE — Addendum Note (Signed)
Addended by: Towana Badger on: 03/20/2018 11:53 AM   Modules accepted: Orders

## 2018-03-20 NOTE — Progress Notes (Addendum)
Per last lab:  "increase you dose to 1/2 tab on Monday and Thursday and a whole tab the other 5 days per week. Plan to recheck your level in about 6 weeks"  Medication directions also updated

## 2018-03-25 ENCOUNTER — Other Ambulatory Visit: Payer: Self-pay | Admitting: Physician Assistant

## 2018-03-25 NOTE — Progress Notes (Signed)
Office Visit Note  Patient: Heidi Carney             Date of Birth: [birth year]           MRN: [id number]             PCP: Lavada Mesi Referring: Lavada Mesi Visit Date: 04/08/2018 Occupation: Teacher -high school  Subjective:  Positive ANA, Pain in hands..   History of Present Illness: Heidi Carney is a 52 y.o. female consultation per request of her allergist.  According to patient her symptoms started about 2 years ago which she relates to possible food allergies.  She states she has eaten at 2 or 3 different restaurants after that she developed episodes of dizziness vomiting and diarrhea.  She also had pruritus at this time.  She had been seen Dr. Verlin Fester an allergist.  Who had done allergy testing and is suspecting she may be allergic to sulfites.  He also did some lab work and her ANA was positive for that reason she was referred to me.  She states she does have some problems with dry eyes but not much dry mouth symptoms.  She also has been experiencing some stiffness and changes in her hands for which she was seen by her PCP the x-rays done of her bilateral hands were consistent with osteoarthritis.  Her hand swells at times.  She is occasional stiffness in her knee joints and her lower back.  None of the other joints are painful or swollen.  Activities of Daily Living:  Patient reports morning stiffness for 0 minute.   Patient Denies nocturnal pain.  Difficulty dressing/grooming: Denies Difficulty climbing stairs: Denies Difficulty getting out of chair: Denies Difficulty using hands for taps, buttons, cutlery, and/or writing: Denies  Review of Systems  Constitutional: Positive for fatigue. Negative for night sweats, weight gain and weight loss.  HENT: Negative for mouth sores, trouble swallowing, trouble swallowing, mouth dryness and nose dryness.   Eyes: Positive for dryness. Negative for pain, redness and visual disturbance.  Respiratory: Negative for  cough, shortness of breath and difficulty breathing.   Cardiovascular: Negative for chest pain, palpitations, hypertension, irregular heartbeat and swelling in legs/feet.  Gastrointestinal: Positive for constipation. Negative for blood in stool and diarrhea.  Endocrine: Negative for increased urination.  Genitourinary: Negative for vaginal dryness.  Musculoskeletal: Positive for arthralgias and joint pain. Negative for joint swelling, myalgias, muscle weakness, morning stiffness, muscle tenderness and myalgias.  Skin: Positive for rash. Negative for color change, hair loss, skin tightness, ulcers and sensitivity to sunlight.       Related to allergies.  Allergic/Immunologic: Negative for susceptible to infections.  Neurological: Negative for dizziness, memory loss, night sweats and weakness.  Hematological: Negative for swollen glands.  Psychiatric/Behavioral: Negative for depressed mood and sleep disturbance. The patient is nervous/anxious.     PMFS History:  Patient Active Problem List   Diagnosis Date Noted  . Family history of psoriatic arthritis 04/08/2018  . Family history of morphea 04/08/2018  . Allergic urticaria 02/06/2018  . Angioedema 02/06/2018  . Food intolerance 02/06/2018  . Chronic rhinitis 02/06/2018  . Increased pressure in the eye, left 01/02/2018  . Colon polyp 12/18/2017  . Grade I internal hemorrhoids 12/18/2017  . Bouchard's nodes 12/17/2017  . Joint stiffness 12/17/2017  . Primary osteoarthritis of left hand 12/17/2017  . 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 Multiple food allergies 07/12/2017  . Nausea 07/07/2017  . Intractable cyclical vomiting  with nausea 07/07/2017  . Diarrhea 07/07/2017  . Abdominal cramping 07/07/2017  . Allergic reaction 07/07/2017  . Change in stool 02/12/2017  . Left lower quadrant pain 01/24/2017  . Slow transit constipation 01/24/2017  . Right ovarian cyst 01/24/2017  . Essential hypertension  01/09/2017  . Migraine headache without aura 07/10/2016  . Hair loss 11/16/2015  . RLS (restless legs syndrome) 11/16/2015  . B12 deficiency 11/16/2015  . Vitamin D insufficiency 11/16/2015  . Perimenopausal symptoms 09/13/2015  . Overweight 03/17/2015  . Seborrheic dermatitis 03/10/2015  . GAD (generalized anxiety disorder) 01/26/2015  . Elevated blood pressure reading 01/26/2015  . Thyroid activity decreased 01/26/2015    Past Medical History:  Diagnosis Date  . Anxiety   . H/O total vaginal hysterectomy 05/2017  . Hypothyroidism     Family History  Problem Relation Age of Onset  . Diabetes Father   . Cancer Father        prostate CA  . Stroke Father   . Glaucoma Mother   . Asthma Sister   . Psoriasis Sister        psoriatic arthritis  . Arthritis Sister   . Scleroderma Sister   . Healthy Daughter   . Allergic rhinitis Neg Hx   . Angioedema Neg Hx   . Eczema Neg Hx   . Immunodeficiency Neg Hx   . Urticaria Neg Hx    Past Surgical History:  Procedure Laterality Date  . CESAREAN SECTION  2012  . EYE SURGERY    . LAPAROSCOPIC TOTAL HYSTERECTOMY  05/2017   Social History   Social History Narrative  . Not on file    Objective: Vital Signs: BP (!) 144/87 (BP Location: Right Arm, Patient Position: Sitting, Cuff Size: Normal)   Pulse 92   Resp 13   Ht 5\' 4"  (1.626 m)   Wt 172 lb (78 kg)   LMP 01/21/2015   BMI 29.52 kg/m    Physical Exam Vitals signs and nursing note reviewed.  Constitutional:      Appearance: She is well-developed.  HENT:     Head: Normocephalic and atraumatic.  Eyes:     Conjunctiva/sclera: Conjunctivae normal.  Neck:     Musculoskeletal: Normal range of motion.  Cardiovascular:     Rate and Rhythm: Normal rate and regular rhythm.     Heart sounds: Normal heart sounds.  Pulmonary:     Effort: Pulmonary effort is normal.     Breath sounds: Normal breath sounds.  Abdominal:     General: Bowel sounds are normal.     Palpations:  Abdomen is soft.  Lymphadenopathy:     Cervical: No cervical adenopathy.  Skin:    General: Skin is warm and dry.     Capillary Refill: Capillary refill takes less than 2 seconds.  Neurological:     Mental Status: She is alert and oriented to person, place, and time.  Psychiatric:        Behavior: Behavior normal.      Musculoskeletal Exam: C-spine thoracic lumbar spine good range of motion.  Shoulder joints elbow joints wrist joints with good range of motion.  She has bilateral CMC PIP and DIP thickening without any synovitis consistent with osteoarthritis.  Hip joints knee joints ankles MTPs PIPs were in good range of motion with no synovitis.    CDAI Exam: CDAI Score: Not documented Patient Global Assessment: Not documented; Provider Global Assessment: Not documented Swollen: Not documented; Tender: Not documented Joint Exam   Not documented  There is currently no information documented on the homunculus. Go to the Rheumatology activity and complete the homunculus joint exam.  Investigation: Findings:  02/06/18: ANA positive, Ro positive, La-, sed rate 26, dsDNA-, RNP-, smith -, Scl-70-,Chromatin ab-, Anti-Jo1-, centromere ab-  Component     Latest Ref Rng & Units 02/06/2018  Anti Nuclear Antibody(ANA)     Negative Positive (A)  dsDNA Ab     0 - 9 IU/mL <1  ENA RNP Ab     0.0 - 0.9 AI 0.4  ENA SM Ab Ser-aCnc     0.0 - 0.9 AI <0.2  Scleroderma SCL-70     0.0 - 0.9 AI <0.2  ENA SSA (RO) Ab     0.0 - 0.9 AI >8.0 (H)  ENA SSB (LA) Ab     0.0 - 0.9 AI <0.2  Chromatin Ab SerPl-aCnc     0.0 - 0.9 AI <0.2  Anti JO-1     0.0 - 0.9 AI <0.2  CENTROMERE AB SCREEN     0.0 - 0.9 AI <0.2  SEE BELOW      Comment  Sed Rate     0 - 40 mm/hr 26   Imaging: No results found.  Recent Labs: Lab Results  Component Value Date   WBC 5.6 02/06/2018   HGB 14.4 02/06/2018   PLT 177 02/06/2018   NA 141 02/06/2018   K 4.2 02/06/2018   CL 101 02/06/2018   CO2 26 02/06/2018    GLUCOSE 99 02/06/2018   BUN 8 02/06/2018   CREATININE 0.97 02/06/2018   BILITOT 0.4 02/06/2018   ALKPHOS 87 02/06/2018   AST 23 02/06/2018   ALT 23 02/06/2018   PROT 7.4 02/06/2018   ALBUMIN 4.4 02/06/2018   CALCIUM 9.4 02/06/2018   GFRAA 79 02/06/2018    Speciality Comments: No specialty comments available.  Procedures:  No procedures performed Allergies: Shrimp [shellfish allergy]; Celexa [citalopram hydrobromide]; Effexor [venlafaxine]; Sulfur; and Synthroid [levothyroxine sodium]   Assessment / Plan:     Visit Diagnoses: Sjogren's syndrome with keratoconjunctivitis sicca ([place]) -patient has been experiencing mild dry eyes symptoms.  She has occasional dry mouth.  She denies any frequent cavities.  Increase fluid intake was discussed.  Over-the-counter products were discussed.  At this point I do not think she needs immunosuppression.  I will obtain some additional labs today.  02/06/18: ANA positive, Ro positive, La-, sed rate 26, dsDNA-, RNP-, smith -, Scl-70-,Chromatin ab-, Anti-Jo1-, centromere ab- - Plan: Urinalysis, Routine w reflex microscopic, C3 and C4, Cardiolipin antibodies, IgG, IgM, IgA, Lupus Anticoagulant Eval w/Reflex, Beta-2 glycoprotein antibodies  Primary osteoarthritis of both hands-joint protection muscle strengthening was discussed.  A handout on hand exercises was given.  At this point she does not have much discomfort in her hands.  Essential hypertension-her blood pressure is elevated.  Have advised her to monitor blood pressure closely and follow-up with her PCP.   History of allergies patient states that she has been having episodes of nausea vomiting and dizziness after having certain foods.  She has been followed up by an allergist closely.  She also develops pruritic rash after those episodes.  Other medical problems are listed as follows:  Hx of migraines  Vitamin D deficiency  Vitamin B12 deficiency  RLS (restless legs syndrome)  History of  anxiety  Seborrheic dermatitis  History of hypothyroidism  Family history of psoriatic arthritis - Sister  Family history of morphea - sister   Orders: Orders Placed This Encounter  Procedures  . Urinalysis, Routine w reflex microscopic  . C3 and C4  . Cardiolipin antibodies, IgG, IgM, IgA  . Lupus Anticoagulant Eval w/Reflex  . Beta-2 glycoprotein antibodies   No orders of the defined types were placed in this encounter.   Face-to-face time spent with patient was 50 minutes. Greater than 50% of time was spent in counseling and coordination of care.  Follow-Up Instructions: Return for Sjogren's, Osteoarthritis.   Bo Merino, MD  Note - This record has been created using Editor, commissioning.  Chart creation errors have been sought, but may not always  have been located. Such creation errors do not reflect on  the standard of medical care.

## 2018-03-29 ENCOUNTER — Ambulatory Visit (INDEPENDENT_AMBULATORY_CARE_PROVIDER_SITE_OTHER): Payer: BC Managed Care – PPO | Admitting: Family Medicine

## 2018-03-29 VITALS — BP 135/80 | HR 80 | Ht 64.0 in | Wt 172.0 lb

## 2018-03-29 DIAGNOSIS — I10 Essential (primary) hypertension: Secondary | ICD-10-CM | POA: Diagnosis not present

## 2018-03-29 NOTE — Progress Notes (Signed)
HTN -home blood pressures look well-controlled.  Her blood pressure cuff measured similar to ours so I think we can be safe to just continue to monitor. Optimal BP is under 130.   Beatrice Lecher, MD

## 2018-03-29 NOTE — Progress Notes (Signed)
Patient is here today for a blood pressure check. Patient denies chest pain, shortness of breath, palpitations, or medication problems. Patient is currently not on any medication for her blood pressure. Patient does check blood pressure at home, and did bring her blood pressure cuff in with her today. Patient has also kept a log of recent blood pressure readings.  Patient's blood pressure today with our office machine is 141/75. Patient's blood pressure with at home monitor was 144/94. I allowed the patient to sit for about 10 minutes and rechecked blood pressure manually. The manual blood pressure reading was 135/80.  I spoke with covering provider and notified patient to continue to monitor blood pressure at home by checking it twice per day, and if it continues to steadily increase, then for her to follow-up with PCP. Patient verbalized understanding. No further questions or concerns at this time.

## 2018-04-08 ENCOUNTER — Ambulatory Visit: Payer: BC Managed Care – PPO | Admitting: Rheumatology

## 2018-04-08 ENCOUNTER — Encounter: Payer: Self-pay | Admitting: Rheumatology

## 2018-04-08 VITALS — BP 144/87 | HR 92 | Resp 13 | Ht 64.0 in | Wt 172.0 lb

## 2018-04-08 DIAGNOSIS — G2581 Restless legs syndrome: Secondary | ICD-10-CM

## 2018-04-08 DIAGNOSIS — I10 Essential (primary) hypertension: Secondary | ICD-10-CM | POA: Diagnosis not present

## 2018-04-08 DIAGNOSIS — Z840 Family history of diseases of the skin and subcutaneous tissue: Secondary | ICD-10-CM | POA: Insufficient documentation

## 2018-04-08 DIAGNOSIS — L219 Seborrheic dermatitis, unspecified: Secondary | ICD-10-CM

## 2018-04-08 DIAGNOSIS — M19041 Primary osteoarthritis, right hand: Secondary | ICD-10-CM

## 2018-04-08 DIAGNOSIS — Z9109 Other allergy status, other than to drugs and biological substances: Secondary | ICD-10-CM

## 2018-04-08 DIAGNOSIS — Z8261 Family history of arthritis: Secondary | ICD-10-CM | POA: Insufficient documentation

## 2018-04-08 DIAGNOSIS — Z8659 Personal history of other mental and behavioral disorders: Secondary | ICD-10-CM

## 2018-04-08 DIAGNOSIS — M3501 Sicca syndrome with keratoconjunctivitis: Secondary | ICD-10-CM | POA: Diagnosis not present

## 2018-04-08 DIAGNOSIS — M19042 Primary osteoarthritis, left hand: Secondary | ICD-10-CM

## 2018-04-08 DIAGNOSIS — Z8669 Personal history of other diseases of the nervous system and sense organs: Secondary | ICD-10-CM | POA: Diagnosis not present

## 2018-04-08 DIAGNOSIS — Z8639 Personal history of other endocrine, nutritional and metabolic disease: Secondary | ICD-10-CM

## 2018-04-08 DIAGNOSIS — E538 Deficiency of other specified B group vitamins: Secondary | ICD-10-CM

## 2018-04-08 DIAGNOSIS — E559 Vitamin D deficiency, unspecified: Secondary | ICD-10-CM

## 2018-04-08 NOTE — Patient Instructions (Addendum)
Hand Exercises Hand exercises can be helpful to almost anyone. These exercises can strengthen the hands, improve flexibility and movement, and increase blood flow to the hands. These results can make work and daily tasks easier. Hand exercises can be especially helpful for people who have joint pain from arthritis or have nerve damage from overuse (carpal tunnel syndrome). These exercises can also help people who have injured a hand. Most of these hand exercises are fairly gentle stretching routines. You can do them often throughout the day. Still, it is a good idea to ask your health care provider which exercises would be best for you. Warming your hands before exercise may help to reduce stiffness. You can do this with gentle massage or by placing your hands in warm water for 15 minutes. Also, make sure you pay attention to your level of hand pain as you begin an exercise routine. Exercises Knuckle bend Repeat this exercise 5-10 times with each hand. 1. Stand or sit with your arm, hand, and all five fingers pointed straight up. Make sure your wrist is straight. 2. Gently and slowly bend your fingers down and inward until the tips of your fingers are touching the tops of your palm. 3. Hold this position for a few seconds. 4. Extend your fingers out to their original position, all pointing straight up again. Finger fan Repeat this exercise 5-10 times with each hand. 1. Hold your arm and hand out in front of you. Keep your wrist straight. 2. Squeeze your hand into a fist. 3. Hold this position for a few seconds. 4. Fan out, or spread apart, your hand and fingers as much as possible, stretching every joint fully. Tabletop Repeat this exercise 5-10 times with each hand. 1. Stand or sit with your arm, hand, and all five fingers pointed straight up. Make sure your wrist is straight. 2. Gently and slowly bend your fingers at the knuckles where they meet the hand until your hand is making an upside-down  L shape. Your fingers should form a tabletop. 3. Hold this position for a few seconds. 4. Extend your fingers out to their original position, all pointing straight up again. Making Os Repeat this exercise 5-10 times with each hand. 1. Stand or sit with your arm, hand, and all five fingers pointed straight up. Make sure your wrist is straight. 2. Make an O shape by touching your pointer finger to your thumb. Hold for a few seconds. Then open your hand wide. 3. Repeat this motion with each finger on your hand. Table spread Repeat this exercise 5-10 times with each hand. 1. Place your hand on a table with your palm facing down. Make sure your wrist is straight. 2. Spread your fingers out as much as possible. Hold this position for a few seconds. 3. Slide your fingers back together again. Hold for a few seconds. Ball grip Repeat this exercise 10-15 times with each hand. 1. Hold a tennis ball or another soft ball in your hand. 2. While slowly increasing pressure, squeeze the ball as hard as possible. 3. Squeeze as hard as you can for 3-5 seconds. 4. Relax and repeat.  Wrist curls Repeat this exercise 10-15 times with each hand. 1. Sit in a chair that has armrests. 2. Hold a light weight in your hand, such as a dumbbell that weighs 1-3 pounds (0.5-1.4 kg). Ask your health care provider what weight would be best for you. 3. Rest your hand just over the end of the chair arm   with your palm facing up. 4. Gently pivot your wrist up and down while holding the weight. Do not twist your wrist from side to side. Contact a health care provider if:  Your hand pain or discomfort gets much worse when you do an exercise.  Your hand pain or discomfort does not improve within 2 hours after you exercise. If you have any of these problems, stop doing these exercises right away. Do not do them again unless your health care provider says that you can. Get help right away if:  You develop sudden, severe hand  pain. If this happens, stop doing these exercises right away. Do not do them again unless your health care provider says that you can. This information is not intended to replace advice given to you by your health care provider. Make sure you discuss any questions you have with your health care provider. Document Released: 03/01/2015 Document Revised: 07/24/2017 Document Reviewed: 09/28/2014 Elsevier Interactive Patient Education  2019 [employer] Sjogren Syndrome  Sjogren syndrome is a disease in which the body's disease-fighting system (immune system) attacks the glands that produce tears (lacrimal glands) and the glands that produce saliva (salivary glands). This makes the eyes and mouth very dry. Sjogren syndrome is a long-term (chronic) disorder that has no cure. In some cases, it is linked to other disorders (rheumatic disorders), such as rheumatoid arthritis and systemic lupus erythematosus (SLE). It may affect other parts of the body, such as:  Kidneys.  Blood vessels.  Joints.  Lungs.  Liver.  Pancreas.  Brain.  Nerves.  Spinal cord. What are the causes? The cause of this condition is not known. It may be passed along from parent to child (inherited), or it may be a symptom of a rheumatic disorder. What increases the risk? This condition is more likely to develop in:  Women.  People who are 13-81 years old.  People who have recently had a viral infection or currently have a viral infection. What are the signs or symptoms? The main symptoms of this condition are:  Dry mouth. This may include: ? A chalky feeling. ? Difficulty swallowing, speaking, or tasting. ? Frequent cavities in teeth. ? Frequent mouth infections.  Dry eyes. This may include: ? Burning, redness, and itching. ? Blurry vision. ? Light sensitivity. Other symptoms may include:  Dryness of the skin and the inside of the nose.  Eyelid infections.  Vaginal dryness, if this  applies.  Joint pain and stiffness.  Muscle pain and stiffness. How is this diagnosed? This condition is diagnosed based on:  Your symptoms.  Your medical history.  A physical exam of your eyes and mouth. You may have tests, including:  Schirmer test. This tests your tear production.  An eye exam that is done with a magnifying device (slit-lamp exam).  An eye test that temporarily stains your eye with dye. This shows the extent of eye damage.  Tests to check your salivary gland function.  Biopsy. This is a removal of part of a salivary gland from inside your lower lip to be studied under a microscope.  Chest X-rays.  Blood tests.  Urine tests. How is this treated? There is no cure for this condition, but treatment can help you manage your symptoms. Treatment options may include:  Moisture replacement therapies to help relieve dryness in your skin, mouth, and eyes.  NSAIDs to help relieve pain and stiffness.  Medicines to help relieve inflammation in your body(corticosteroids). These are usually for severe cases.  Medicines to help reduce the activity of your immune system (immunosuppressants).  Surgery or insertion of plugs to close the lacrimal glands (punctal occlusion). Thishelps keep more natural tears in your eyes. Follow these instructions at home:  Take over-the-counter and prescription medicines only as told by your health care provider.  Take these actions to care for your eyes: ? Use eye drops as told by your health care provider. ? Blink at least 5-6 times a minute. ? Protect your eyes from drafts and breezes. ? Maintain properly humidified air. You may want to use a humidifier at home. ? Avoid smoke.  Take these actions to care for your mouth: ? Brush your teeth and floss after every meal. ? Chew sugar-free gum or suck on hard candy. For some people, this can help to relieve dry mouth. ? Use antimicrobial mouthwash daily. ? Take frequent sips of  water or sugar-free drinks. ? Use saliva substitutes or lip balm as told by your health care provider.  Drink enough fluid to keep your urine clear or pale yellow.  Schedule and attend dentist visits every six months.  Keep all follow-up visits as told by your health care provider. This is important. Contact a health care provider if:  You have a fever.  You have night sweats.  You are always tired.  You have unexplained weight loss.  You develop itchy skin.  You have red patches on your skin.  You have a lump or swelling on your neck. This information is not intended to replace advice given to you by your health care provider. Make sure you discuss any questions you have with your health care provider. Document Released: 03/10/2002 Document Revised: 11/14/2015 Document Reviewed: 11/26/2014 Elsevier Interactive Patient Education  2019 [employer]  Treating Dry Mouth  Dry Mouth (also called xerostomia) is a condition that occurs when your body doesn't produce enough saliva. Saliva is an essential body fluid for protection and preservation of the oral cavity and oral functions.  Dry mouth can cause complications such as difficulty swallowing, severe and progressive tooth decay, and oral infections (particularly fungal).   Below are products and other management strategies that can help relieve the symptoms and reduce complications of dry mouth.  . Keep your mouth moist by sipping small amounts of water during the day. Excessive sips of can reduce the oral mucus film and increase symptoms. . Avoid frequent intake of acidic and caffeinated beverages. . Increase salivary secretion by chewing gum containing no sugar or sucking sugar free hard candies. . Over-the-counter saliva substitutes such as Biotne( Moisturizing Gel, Oral Rinses and Mouth Spray), Oramoist (patches), OraCoat (melts and lozenges).  Biotne also makes non-irritating toothpaste.

## 2018-04-10 LAB — URINALYSIS, ROUTINE W REFLEX MICROSCOPIC
BILIRUBIN URINE: NEGATIVE
GLUCOSE, UA: NEGATIVE
Hgb urine dipstick: NEGATIVE
Ketones, ur: NEGATIVE
Leukocytes, UA: NEGATIVE
Nitrite: NEGATIVE
Protein, ur: NEGATIVE
Specific Gravity, Urine: 1.008 (ref 1.001–1.03)
pH: 6.5 (ref 5.0–8.0)

## 2018-04-10 LAB — LUPUS ANTICOAGULANT EVAL W/ REFLEX
PTT-LA Screen: 35 s (ref <=40)
dRVVT: 40 s (ref <=45)

## 2018-04-10 LAB — CARDIOLIPIN ANTIBODIES, IGG, IGM, IGA
Anticardiolipin IgA: <11 [APL'U]
Anticardiolipin IgG: <14 [GPL'U]
Anticardiolipin IgM: <12 [MPL'U]

## 2018-04-10 LAB — BETA-2 GLYCOPROTEIN ANTIBODIES
Beta-2 Glyco 1 IgM: <9 SMU (ref <=20)
Beta-2 Glyco I IgG: <9 SGU (ref <=20)

## 2018-04-10 LAB — C3 AND C4
C3 COMPLEMENT: 147 mg/dL (ref 83–193)
C4 Complement: 34 mg/dL (ref 15–57)

## 2018-04-14 ENCOUNTER — Encounter: Payer: Self-pay | Admitting: Physician Assistant

## 2018-04-15 MED ORDER — OSELTAMIVIR PHOSPHATE 75 MG PO CAPS: 75.0000 mg | ORAL_CAPSULE | Freq: Every day | ORAL | 0 refills | Status: DC

## 2018-04-30 DIAGNOSIS — M3501 Sicca syndrome with keratoconjunctivitis: Secondary | ICD-10-CM | POA: Insufficient documentation

## 2018-04-30 NOTE — Progress Notes (Signed)
Office Visit Note  Patient: Heidi Carney             Date of Birth: 10/31/1965           MRN: [id number]             PCP: Lavada Mesi Referring: Donella Stade, PA-C Visit Date: 05/14/2018 Occupation: @GUAROCC @  Subjective:  Dry eyes.   History of Present Illness: Heidi Carney is a 53 y.o. female history of Sjogren's and osteoarthritis.  She continues to have dry mouth and dry eyes.  She has been using Biotene products.  Is not having much discomfort in her hands.  She gives history of lower back pain especially when getting up from the sitting position.  She denies any radiculopathy.  Activities of Daily Living:  Patient reports morning stiffness for 0 none.   Patient Denies nocturnal pain.  Difficulty dressing/grooming: Denies Difficulty climbing stairs: Denies Difficulty getting out of chair: Denies Difficulty using hands for taps, buttons, cutlery, and/or writing: Denies  Review of Systems  Constitutional: Positive for fatigue. Negative for night sweats, weight gain and weight loss.  HENT: Positive for mouth dryness. Negative for mouth sores, trouble swallowing, trouble swallowing and nose dryness.   Eyes: Positive for dryness. Negative for pain, redness and visual disturbance.  Respiratory: Negative for cough, shortness of breath and difficulty breathing.   Cardiovascular: Negative for chest pain, palpitations, hypertension, irregular heartbeat and swelling in legs/feet.  Gastrointestinal: Positive for constipation. Negative for blood in stool and diarrhea.  Endocrine: Negative for excessive thirst and increased urination.  Genitourinary: Negative for difficulty urinating and vaginal dryness.  Musculoskeletal: Negative for arthralgias, joint pain, joint swelling, myalgias, muscle weakness, morning stiffness, muscle tenderness and myalgias.  Skin: Positive for rash. Negative for color change, hair loss, skin tightness, ulcers and sensitivity to sunlight.    Allergic/Immunologic: Negative for susceptible to infections.  Neurological: Negative for dizziness, memory loss, night sweats and weakness.  Hematological: Negative for bruising/bleeding tendency and swollen glands.  Psychiatric/Behavioral: Negative for depressed mood and sleep disturbance. The patient is not nervous/anxious.     PMFS History:  Patient Active Problem List   Diagnosis Date Noted  . Sjogren's syndrome with keratoconjunctivitis sicca ([place]) 04/30/2018  . Family history of psoriatic arthritis 04/08/2018  . Family history of morphea 04/08/2018  . Allergic urticaria 02/06/2018  . Angioedema 02/06/2018  . Food intolerance 02/06/2018  . Chronic rhinitis 02/06/2018  . Increased pressure in the eye, left 01/02/2018  . Colon polyp 12/18/2017  . Grade I internal hemorrhoids 12/18/2017  . Bouchard's nodes 12/17/2017  . Joint stiffness 12/17/2017  . Primary osteoarthritis of both hands 12/17/2017  . 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 Multiple food allergies 07/12/2017  . Nausea 07/07/2017  . Intractable cyclical vomiting with nausea 07/07/2017  . Diarrhea 07/07/2017  . Abdominal cramping 07/07/2017  . Allergic reaction 07/07/2017  . Change in stool 02/12/2017  . Left lower quadrant pain 01/24/2017  . Slow transit constipation 01/24/2017  . Right ovarian cyst 01/24/2017  . Essential hypertension 01/09/2017  . Migraine headache without aura 07/10/2016  . Hair loss 11/16/2015  . RLS (restless legs syndrome) 11/16/2015  . B12 deficiency 11/16/2015  . Vitamin D insufficiency 11/16/2015  . Perimenopausal symptoms 09/13/2015  . Overweight 03/17/2015  . Seborrheic dermatitis 03/10/2015  . GAD (generalized anxiety disorder) 01/26/2015  . Elevated blood pressure reading 01/26/2015  . Thyroid activity decreased 01/26/2015    Past Medical History:  Diagnosis  Date  . Anxiety   . H/O total vaginal hysterectomy 05/2017  . Hypothyroidism      Family History  Problem Relation Age of Onset  . Diabetes Father   . Cancer Father        prostate CA  . Stroke Father   . Glaucoma Mother   . Asthma Sister   . Psoriasis Sister        psoriatic arthritis  . Arthritis Sister   . Scleroderma Sister   . Healthy Daughter   . Allergic rhinitis Neg Hx   . Angioedema Neg Hx   . Eczema Neg Hx   . Immunodeficiency Neg Hx   . Urticaria Neg Hx    Past Surgical History:  Procedure Laterality Date  . CESAREAN SECTION  2012  . EYE SURGERY    . LAPAROSCOPIC TOTAL HYSTERECTOMY  05/2017   Social History   Social History Narrative  . Not on file   Immunization History  Administered Date(s) Administered  . Influenza, Quadrivalent, Recombinant, Inj, Pf 12/21/2016, 03/15/2018  . Influenza,inj,Quad PF,6+ Mos 03/17/2015, 02/02/2016     Objective: Vital Signs: BP 124/72 (BP Location: Left Arm, Patient Position: Sitting, Cuff Size: Normal)   Pulse 84   Resp 16   Ht 5\' 4"  (1.626 m)   Wt 166 lb 9.6 oz (75.6 kg)   LMP 01/21/2015   BMI 28.60 kg/m    Physical Exam Vitals signs and nursing note reviewed.  Constitutional:      Appearance: She is well-developed.  HENT:     Head: Normocephalic and atraumatic.  Eyes:     Conjunctiva/sclera: Conjunctivae normal.  Neck:     Musculoskeletal: Normal range of motion.  Cardiovascular:     Rate and Rhythm: Normal rate and regular rhythm.     Heart sounds: Normal heart sounds.  Pulmonary:     Effort: Pulmonary effort is normal.     Breath sounds: Normal breath sounds.  Abdominal:     General: Bowel sounds are normal.     Palpations: Abdomen is soft.  Lymphadenopathy:     Cervical: No cervical adenopathy.  Skin:    General: Skin is warm and dry.     Capillary Refill: Capillary refill takes less than 2 seconds.  Neurological:     Mental Status: She is alert and oriented to person, place, and time.  Psychiatric:        Behavior: Behavior normal.      Musculoskeletal Exam: C-spine  thoracic lumbar spine good range of motion.  Shoulder joints elbow joints wrist joint MCPs PIPs DIPs were in good range of motion.  She has PIP and DIP thickening consistent with osteoarthritis.  Hip joints knee joints ankles were in good range of motion with no synovitis.  CDAI Exam: CDAI Score: Not documented Patient Global Assessment: Not documented; Provider Global Assessment: Not documented Swollen: Not documented; Tender: Not documented Joint Exam   Not documented   There is currently no information documented on the homunculus. Go to the Rheumatology activity and complete the homunculus joint exam.  Investigation: No additional findings.  Imaging: No results found.  Recent Labs: Lab Results  Component Value Date   WBC 5.6 02/06/2018   HGB 14.4 02/06/2018   PLT 177 02/06/2018   NA 141 02/06/2018   K 4.2 02/06/2018   CL 101 02/06/2018   CO2 26 02/06/2018   GLUCOSE 99 02/06/2018   BUN 8 02/06/2018   CREATININE 0.97 02/06/2018   BILITOT 0.4 02/06/2018  ALKPHOS 87 02/06/2018   AST 23 02/06/2018   ALT 23 02/06/2018   PROT 7.4 02/06/2018   ALBUMIN 4.4 02/06/2018   CALCIUM 9.4 02/06/2018   GFRAA 79 02/06/2018  UA negative, lupus anticoagulant negative, anticardiolipin negative, beta-2 negative, C3 and C4 negative  Speciality Comments: No specialty comments available.  Procedures:  No procedures performed Allergies: Shrimp [shellfish allergy]; Celexa [citalopram hydrobromide]; Effexor [venlafaxine]; Sulfur; and Synthroid [levothyroxine sodium]   Assessment / Plan:     Visit Diagnoses: Sjogren's syndrome with keratoconjunctivitis sicca ([facility]) - Sicca symptoms with positive ANA and positive Ro.  Detailed counseling running Sjogren's syndrome was provided.  Over-the-counter products were discussed.  I also discussed the option of pilocarpine.  After reviewing indications side effects contraindications we discussed use of pilocarpine at 5 mg p.o. 3 times daily as needed.   She can even try a smaller dose at half a tablet p.o. daily.  Anti-Ro antibody can be associated with arrhythmias have advised her to schedule an appointment with her PCP and get baseline EKG.  Primary osteoarthritis of both hands-she has clinical findings consistent with osteoarthritis.  I did not see any synovitis.  Joint protection muscle strengthening was discussed.  Chronic midline low back pain without sciatica-she has been experiencing lower back pain.  There was no point tenderness.  She had good range of motion.  Have given her a handout on back exercises.  Vitamin D insufficiency-she is on vitamin D supplement.  Allergic urticaria-I have advised her to schedule an appointment with allergist.  Essential hypertension-her blood pressure is within normal limits today.  Family history of morphea - sister  Family history of psoriatic arthritis - sister  GAD (generalized anxiety disorder)  Increased pressure in the eye, left  Migraine without aura and without status migrainosus, not intractable  RLS (restless legs syndrome)  Seborrheic dermatitis   Orders: No orders of the defined types were placed in this encounter.  Meds ordered this encounter  Medications  . pilocarpine (SALAGEN) 5 MG tablet    Sig: Take 1 tablet (5 mg total) by mouth 3 (three) times daily as needed.    Dispense:  90 tablet    Refill:  2     Follow-Up Instructions: Return in about 6 months (around 11/12/2018) for Sjogren's, Osteoarthritis.   Bo Merino, MD  Note - This record has been created using Editor, commissioning.  Chart creation errors have been sought, but may not always  have been located. Such creation errors do not reflect on  the standard of medical care.

## 2018-05-07 ENCOUNTER — Encounter: Payer: Self-pay | Admitting: Family Medicine

## 2018-05-08 ENCOUNTER — Other Ambulatory Visit: Payer: Self-pay

## 2018-05-08 DIAGNOSIS — E039 Hypothyroidism, unspecified: Secondary | ICD-10-CM

## 2018-05-08 LAB — TSH: TSH: 1.36 mIU/L

## 2018-05-08 MED ORDER — LEVOTHYROXINE SODIUM 75 MCG PO CAPS: ORAL_CAPSULE | ORAL | 0 refills | Status: DC

## 2018-05-08 NOTE — Progress Notes (Signed)
Perfect TSH. How do you feel?  Ok to give refill and stay at same dose for 3 months. Will recheck in 3 months.

## 2018-05-14 ENCOUNTER — Ambulatory Visit: Payer: BC Managed Care – PPO | Admitting: Rheumatology

## 2018-05-14 ENCOUNTER — Encounter: Payer: Self-pay | Admitting: Rheumatology

## 2018-05-14 VITALS — BP 124/72 | HR 84 | Resp 16 | Ht 64.0 in | Wt 166.6 lb

## 2018-05-14 DIAGNOSIS — L219 Seborrheic dermatitis, unspecified: Secondary | ICD-10-CM

## 2018-05-14 DIAGNOSIS — G8929 Other chronic pain: Secondary | ICD-10-CM

## 2018-05-14 DIAGNOSIS — M3501 Sicca syndrome with keratoconjunctivitis: Secondary | ICD-10-CM | POA: Diagnosis not present

## 2018-05-14 DIAGNOSIS — M19042 Primary osteoarthritis, left hand: Secondary | ICD-10-CM

## 2018-05-14 DIAGNOSIS — Z840 Family history of diseases of the skin and subcutaneous tissue: Secondary | ICD-10-CM

## 2018-05-14 DIAGNOSIS — G2581 Restless legs syndrome: Secondary | ICD-10-CM

## 2018-05-14 DIAGNOSIS — F411 Generalized anxiety disorder: Secondary | ICD-10-CM

## 2018-05-14 DIAGNOSIS — H40052 Ocular hypertension, left eye: Secondary | ICD-10-CM

## 2018-05-14 DIAGNOSIS — E559 Vitamin D deficiency, unspecified: Secondary | ICD-10-CM | POA: Diagnosis not present

## 2018-05-14 DIAGNOSIS — M19041 Primary osteoarthritis, right hand: Secondary | ICD-10-CM | POA: Diagnosis not present

## 2018-05-14 DIAGNOSIS — I10 Essential (primary) hypertension: Secondary | ICD-10-CM

## 2018-05-14 DIAGNOSIS — L500 Allergic urticaria: Secondary | ICD-10-CM

## 2018-05-14 DIAGNOSIS — G43009 Migraine without aura, not intractable, without status migrainosus: Secondary | ICD-10-CM

## 2018-05-14 DIAGNOSIS — Z8261 Family history of arthritis: Secondary | ICD-10-CM

## 2018-05-14 DIAGNOSIS — M545 Low back pain: Secondary | ICD-10-CM

## 2018-05-14 MED ORDER — PILOCARPINE HCL 5 MG PO TABS: 5.0000 mg | ORAL_TABLET | Freq: Three times a day (TID) | ORAL | 2 refills | Status: DC | PRN

## 2018-05-14 NOTE — Patient Instructions (Addendum)
Sjogren Syndrome  Sjogren syndrome is a disease in which the body's disease-fighting system (immune system) attacks the glands that produce tears (lacrimal glands) and the glands that produce saliva (salivary glands). This makes the eyes and mouth very dry. Sjogren syndrome is a long-term (chronic) disorder that has no cure. In some cases, it is linked to other disorders (rheumatic disorders), such as rheumatoid arthritis and systemic lupus erythematosus (SLE). It may affect other parts of the body, such as:  Kidneys.  Blood vessels.  Joints.  Lungs.  Liver.  Pancreas.  Brain.  Nerves.  Spinal cord. What are the causes? The cause of this condition is not known. It may be passed along from parent to child (inherited), or it may be a symptom of a rheumatic disorder. What increases the risk? This condition is more likely to develop in:  Women.  People who are 22-41 years old.  People who have recently had a viral infection or currently have a viral infection. What are the signs or symptoms? The main symptoms of this condition are:  Dry mouth. This may include: ? A chalky feeling. ? Difficulty swallowing, speaking, or tasting. ? Frequent cavities in teeth. ? Frequent mouth infections.  Dry eyes. This may include: ? Burning, redness, and itching. ? Blurry vision. ? Light sensitivity. Other symptoms may include:  Dryness of the skin and the inside of the nose.  Eyelid infections.  Vaginal dryness, if this applies.  Joint pain and stiffness.  Muscle pain and stiffness. How is this diagnosed? This condition is diagnosed based on:  Your symptoms.  Your medical history.  A physical exam of your eyes and mouth. You may have tests, including:  Schirmer test. This tests your tear production.  An eye exam that is done with a magnifying device (slit-lamp exam).  An eye test that temporarily stains your eye with dye. This shows the extent of eye damage.  Tests  to check your salivary gland function.  Biopsy. This is a removal of part of a salivary gland from inside your lower lip to be studied under a microscope.  Chest X-rays.  Blood tests.  Urine tests. How is this treated? There is no cure for this condition, but treatment can help you manage your symptoms. Treatment options may include:  Moisture replacement therapies to help relieve dryness in your skin, mouth, and eyes.  NSAIDs to help relieve pain and stiffness.  Medicines to help relieve inflammation in your body(corticosteroids). These are usually for severe cases.  Medicines to help reduce the activity of your immune system (immunosuppressants).  Surgery or insertion of plugs to close the lacrimal glands (punctal occlusion). Thishelps keep more natural tears in your eyes. Follow these instructions at home:  Take over-the-counter and prescription medicines only as told by your health care provider.  Take these actions to care for your eyes: ? Use eye drops as told by your health care provider. ? Blink at least 5-6 times a minute. ? Protect your eyes from drafts and breezes. ? Maintain properly humidified air. You may want to use a humidifier at home. ? Avoid smoke.  Take these actions to care for your mouth: ? Brush your teeth and floss after every meal. ? Chew sugar-free gum or suck on hard candy. For some people, this can help to relieve dry mouth. ? Use antimicrobial mouthwash daily. ? Take frequent sips of water or sugar-free drinks. ? Use saliva substitutes or lip balm as told by your health care provider.  Drink enough fluid to keep your urine clear or pale yellow.  Schedule and attend dentist visits every six months.  Keep all follow-up visits as told by your health care provider. This is important. Contact a health care provider if:  You have a fever.  You have night sweats.  You are always tired.  You have unexplained weight loss.  You develop itchy  skin.  You have red patches on your skin.  You have a lump or swelling on your neck. This information is not intended to replace advice given to you by your health care provider. Make sure you discuss any questions you have with your health care provider. Document Released: 03/10/2002 Document Revised: 11/14/2015 Document Reviewed: 11/26/2014 Elsevier Interactive Patient Education  2019 [employer]   Treating Dry Mouth  Dry Mouth (also called xerostomia) is a condition that occurs when your body doesn't produce enough saliva. Saliva is an essential body fluid for protection and preservation of the oral cavity and oral functions.  Dry mouth can cause complications such as difficulty swallowing, severe and progressive tooth decay, and oral infections (particularly fungal).   Below are products and other management strategies that can help relieve the symptoms and reduce complications of dry mouth.  . Keep your mouth moist by sipping small amounts of water during the day. Excessive sips of can reduce the oral mucus film and increase symptoms. . Avoid frequent intake of acidic and caffeinated beverages. . Increase salivary secretion by chewing gum containing no sugar or sucking sugar free hard candies. . Over-the-counter saliva substitutes such as Biotne( Moisturizing Gel, Oral Rinses and Mouth Spray), Oramoist (patches), OraCoat (melts and lozenges).  Biotne also makes non-irritating toothpaste.  Pilocarpine tablets What is this medicine? PILOCARPINE (PYE loe KAR peen) can increase saliva in the mouth. This medicine is used to treat dry mouth from radiation treatment or from Sjogren's syndrome. This medicine may be used for other purposes; ask your health care provider or pharmacist if you have questions. COMMON BRAND NAME(S): Salagen What should I tell my health care provider before I take this medicine? They need to know if you have any of these conditions: -eye infection or other eye  problems -glaucoma -heart disease -liver disease -lung or breathing disease, like asthma -an unusual or allergic reaction to pilocarpine, other medicines, foods, dyes, or preservatives -pregnant or trying to get pregnant -breast-feeding How should I use this medicine? Take this medicine by mouth with a glass of water. Follow the directions on the prescription label. Take your doses at regular intervals. Do not take your medicine more often than directed. Talk to your pediatrician regarding the use of this medicine in children. Special care may be needed. Overdosage: If you think you have taken too much of this medicine contact a poison control center or emergency room at once. NOTE: This medicine is only for you. Do not share this medicine with others. What if I miss a dose? If you miss a dose, take it as soon as you can. If it is almost time for your next dose, take only that dose. Do not take double or extra doses. What may interact with this medicine? -antihistamines for allergy, cough and cold -atropine -certain medicines for Alzheimer's disease like donepezil, galantamine, rivastigmine -certain medicines for bladder problems like bethanecol, oxybutynin, tolterodine -certain medicines for Parkinson's disease like benztropine, trihexyphenidyl -certain medicines for quitting smoking like nicotine -certain medicines for stomach problems like dicyclomine, hyoscyamine -certain medicines for travel sickness like scopolamine -ipratropium -  medicines for blood pressure or heart problems like metoprolol This list may not describe all possible interactions. Give your health care provider a list of all the medicines, herbs, non-prescription drugs, or dietary supplements you use. Also tell them if you smoke, drink alcohol, or use illegal drugs. Some items may interact with your medicine. What should I watch for while using this medicine? Visit your doctor for regular check ups. Tell your doctor if  your symptoms do not get better or if they get worse. You may get blurry vision or have trouble telling how far something is from you. This may be a problem at night or when the lights are low. Do not drive, use machinery, or do anything that needs clear vision until you know how this medicine affects you. If you sweat a lot, drink enough to replace fluids. Do not get dehydrated. What side effects may I notice from receiving this medicine? Side effects that you should report to your doctor or health care professional as soon as possible: -allergic reactions like skin rash, itching or hives -breathing problems -confusion -irregular heartbeat -stomach pains -tremor -unusual blood pressure -unusually weak or tired -vomiting Side effects that usually do not require medical attention (report to your doctor or health care professional if they continue or are bothersome): -changes in vision -chills, flushing -diarrhea -headache -increased sweating -nausea -runny eyes, nose -urgent need to pass urine This list may not describe all possible side effects. Call your doctor for medical advice about side effects. You may report side effects to FDA at 1-800-FDA-1088. Where should I keep my medicine? Keep out of the reach of children. Store at room temperature between 15 and 30 degrees C (59 and 86 degrees F). Throw away any unused medicine after the expiration date. NOTE: This sheet is a summary. It may not cover all possible information. If you have questions about this medicine, talk to your doctor, pharmacist, or health care provider.  2019 Elsevier/Gold Standard (2007-10-17 15:03:54) Back Exercises The following exercises strengthen the muscles that help to support the back. They also help to keep the lower back flexible. Doing these exercises can help to prevent back pain or lessen existing pain. If you have back pain or discomfort, try doing these exercises 2-3 times each day or as told by  your health care provider. When the pain goes away, do them once each day, but increase the number of times that you repeat the steps for each exercise (do more repetitions). If you do not have back pain or discomfort, do these exercises once each day or as told by your health care provider. Exercises Single Knee to Chest Repeat these steps 3-5 times for each leg: 1. Lie on your back on a firm bed or the floor with your legs extended. 2. Bring one knee to your chest. Your other leg should stay extended and in contact with the floor. 3. Hold your knee in place by grabbing your knee or thigh. 4. Pull on your knee until you feel a gentle stretch in your lower back. 5. Hold the stretch for 10-30 seconds. 6. Slowly release and straighten your leg. Pelvic Tilt Repeat these steps 5-10 times: 1. Lie on your back on a firm bed or the floor with your legs extended. 2. Bend your knees so they are pointing toward the ceiling and your feet are flat on the floor. 3. Tighten your lower abdominal muscles to press your lower back against the floor. This motion will tilt your  pelvis so your tailbone points up toward the ceiling instead of pointing to your feet or the floor. 4. With gentle tension and even breathing, hold this position for 5-10 seconds. Cat-Cow Repeat these steps until your lower back becomes more flexible: 1. Get into a hands-and-knees position on a firm surface. Keep your hands under your shoulders, and keep your knees under your hips. You may place padding under your knees for comfort. 2. Let your head hang down, and point your tailbone toward the floor so your lower back becomes rounded like the back of a cat. 3. Hold this position for 5 seconds. 4. Slowly lift your head and point your tailbone up toward the ceiling so your back forms a sagging arch like the back of a cow. 5. Hold this position for 5 seconds.  Press-Ups Repeat these steps 5-10 times: 1. Lie on your abdomen (face-down) on  the floor. 2. Place your palms near your head, about shoulder-width apart. 3. While you keep your back as relaxed as possible and keep your hips on the floor, slowly straighten your arms to raise the top half of your body and lift your shoulders. Do not use your back muscles to raise your upper torso. You may adjust the placement of your hands to make yourself more comfortable. 4. Hold this position for 5 seconds while you keep your back relaxed. 5. Slowly return to lying flat on the floor.  Bridges Repeat these steps 10 times: 1. Lie on your back on a firm surface. 2. Bend your knees so they are pointing toward the ceiling and your feet are flat on the floor. 3. Tighten your buttocks muscles and lift your buttocks off of the floor until your waist is at almost the same height as your knees. You should feel the muscles working in your buttocks and the back of your thighs. If you do not feel these muscles, slide your feet 1-2 inches farther away from your buttocks. 4. Hold this position for 3-5 seconds. 5. Slowly lower your hips to the starting position, and allow your buttocks muscles to relax completely. If this exercise is too easy, try doing it with your arms crossed over your chest. Abdominal Crunches Repeat these steps 5-10 times: 1. Lie on your back on a firm bed or the floor with your legs extended. 2. Bend your knees so they are pointing toward the ceiling and your feet are flat on the floor. 3. Cross your arms over your chest. 4. Tip your chin slightly toward your chest without bending your neck. 5. Tighten your abdominal muscles and slowly raise your trunk (torso) high enough to lift your shoulder blades a tiny bit off of the floor. Avoid raising your torso higher than that, because it can put too much stress on your low back and it does not help to strengthen your abdominal muscles. 6. Slowly return to your starting position. Back Lifts Repeat these steps 5-10 times: 1. Lie on your  abdomen (face-down) with your arms at your sides, and rest your forehead on the floor. 2. Tighten the muscles in your legs and your buttocks. 3. Slowly lift your chest off of the floor while you keep your hips pressed to the floor. Keep the back of your head in line with the curve in your back. Your eyes should be looking at the floor. 4. Hold this position for 3-5 seconds. 5. Slowly return to your starting position. Contact a health care provider if:  Your back pain or  discomfort gets much worse when you do an exercise.  Your back pain or discomfort does not lessen within 2 hours after you exercise. If you have any of these problems, stop doing these exercises right away. Do not do them again unless your health care provider says that you can. Get help right away if:  You develop sudden, severe back pain. If this happens, stop doing the exercises right away. Do not do them again unless your health care provider says that you can. This information is not intended to replace advice given to you by your health care provider. Make sure you discuss any questions you have with your health care provider. Document Released: 04/27/2004 Document Revised: 07/24/2017 Document Reviewed: 05/14/2014 Elsevier Interactive Patient Education  [employer].

## 2018-05-17 DIAGNOSIS — H5213 Myopia, bilateral: Secondary | ICD-10-CM | POA: Insufficient documentation

## 2018-05-29 ENCOUNTER — Encounter: Payer: Self-pay | Admitting: Physician Assistant

## 2018-05-31 ENCOUNTER — Ambulatory Visit (INDEPENDENT_AMBULATORY_CARE_PROVIDER_SITE_OTHER): Payer: BC Managed Care – PPO | Admitting: Physician Assistant

## 2018-05-31 ENCOUNTER — Encounter: Payer: Self-pay | Admitting: Physician Assistant

## 2018-05-31 VITALS — BP 147/84 | HR 86 | Temp 98.0°F | Wt 168.2 lb

## 2018-05-31 DIAGNOSIS — E538 Deficiency of other specified B group vitamins: Secondary | ICD-10-CM | POA: Diagnosis not present

## 2018-05-31 DIAGNOSIS — F439 Reaction to severe stress, unspecified: Secondary | ICD-10-CM

## 2018-05-31 DIAGNOSIS — F411 Generalized anxiety disorder: Secondary | ICD-10-CM

## 2018-05-31 DIAGNOSIS — M3501 Sicca syndrome with keratoconjunctivitis: Secondary | ICD-10-CM

## 2018-05-31 MED ORDER — VENLAFAXINE HCL ER 150 MG PO CP24: 150.0000 mg | ORAL_CAPSULE | Freq: Every day | ORAL | 1 refills | Status: DC

## 2018-05-31 MED ORDER — CYANOCOBALAMIN 1000 MCG/ML IJ SOLN: INTRAMUSCULAR | 4 refills | Status: DC

## 2018-05-31 NOTE — Progress Notes (Signed)
   Subjective:    Patient ID: Heidi Carney, female    DOB: [birth year], 53 y.o.   MRN: [id number]  HPI Pt is a 53 yo female with fairly recent dx of Sjogren disease who presents to the clinic to follow up.   Overall she is doing ok with dx but frustrated with management and constant dry mouth. She is regularly seeing eye doctor. Last visit was 2/14. They did suggest she had some increased occular pressure.   Review of Systems    see HPI.  Objective:   Physical Exam Vitals signs reviewed.  Constitutional:      Appearance: Normal appearance.  HENT:     Head: Normocephalic and atraumatic.  Cardiovascular:     Rate and Rhythm: Normal rate and regular rhythm.  Pulmonary:     Effort: Pulmonary effort is normal.     Breath sounds: Normal breath sounds.  Neurological:     General: No focal deficit present.     Mental Status: She is alert and oriented to person, place, and time.           Assessment & Plan:  Marland KitchenMarland KitchenDiagnoses and all orders for this visit:  Sjogren's syndrome with keratoconjunctivitis sicca (HCC)  Stress -     venlafaxine XR (EFFEXOR-XR) 150 MG 24 hr capsule; Take 1 capsule (150 mg total) by mouth daily with breakfast.  GAD (generalized anxiety disorder) -     venlafaxine XR (EFFEXOR-XR) 150 MG 24 hr capsule; Take 1 capsule (150 mg total) by mouth daily with breakfast.  B12 deficiency -     cyanocobalamin (,VITAMIN B-12,) 1000 MCG/ML injection; Take 80ml once a month.   I do think patient has some anxiety/depression/stress surrounding diagnosis of Sjogrens. I think she would benefit with some counseling. [facility]   Currently she is controlled on effexor. Follow up in 3-6 months.   BP up today but 10 days ago looked perfect. Will continue to monitor.   Continue with b12.   We will make every effort to decrease amt of medication that would increase dryness in patient. Consider biotene mouthwash as needed.

## 2018-06-24 ENCOUNTER — Ambulatory Visit: Payer: BC Managed Care – PPO | Admitting: Physician Assistant

## 2018-06-24 ENCOUNTER — Encounter: Payer: Self-pay | Admitting: Physician Assistant

## 2018-06-24 NOTE — Telephone Encounter (Signed)
Can we get her on scheduled for telephone visit first thing tomorrow?

## 2018-06-25 ENCOUNTER — Telehealth (INDEPENDENT_AMBULATORY_CARE_PROVIDER_SITE_OTHER): Payer: BC Managed Care – PPO | Admitting: Physician Assistant

## 2018-06-25 ENCOUNTER — Other Ambulatory Visit: Payer: Self-pay

## 2018-06-25 VITALS — BP 130/78 | HR 78 | Temp 98.2°F

## 2018-06-25 DIAGNOSIS — J0140 Acute pansinusitis, unspecified: Secondary | ICD-10-CM | POA: Diagnosis not present

## 2018-06-25 MED ORDER — AMOXICILLIN-POT CLAVULANATE 875-125 MG PO TABS: 1.0000 | ORAL_TABLET | Freq: Two times a day (BID) | ORAL | 0 refills | Status: DC

## 2018-06-25 NOTE — Progress Notes (Signed)
..Virtual Visit via Telephone Note  I connected with Heidi Carney on 06/25/18 at  8:50 AM EDT by telephone and verified that I am speaking with the correct person using two identifiers.   I discussed the limitations, risks, security and privacy concerns of performing an evaluation and management service by telephone and the availability of in person appointments. I also discussed with the patient that there may be a patient responsible charge related to this service. The patient expressed understanding and agreed to proceed.   History of Present Illness:  Patient is a 53 year old female with Sjogrens syndrome, chronic rhinitis and hx of sinus infections who calls in with bilateral maxillary and frontal sinus pressure.  Symptoms seem to start March 11 and have been persistent.  She feels very congested and like she cannot get anything out.  She is coughing up some green to yellow sputum.  She has been taking over-the-counter Tylenol cold and sinus severe with minimal benefits.  She has a persistent headache.  She is currently working from home and has no sick contacts.  She denies any fever, shortness of breath, wheezing, chest tightness.  .. Active Ambulatory Problems    Diagnosis Date Noted  . GAD (generalized anxiety disorder) 01/26/2015  . Elevated blood pressure reading 01/26/2015  . Thyroid activity decreased 01/26/2015  . Seborrheic dermatitis 03/10/2015  . Overweight 03/17/2015  . Perimenopausal symptoms 09/13/2015  . Hair loss 11/16/2015  . RLS (restless legs syndrome) 11/16/2015  . B12 deficiency 11/16/2015  . Vitamin D insufficiency 11/16/2015  . Migraine headache without aura 07/10/2016  . Essential hypertension 01/09/2017  . Left lower quadrant pain 01/24/2017  . Slow transit constipation 01/24/2017  . Right ovarian cyst 01/24/2017  . Change in stool 02/12/2017  . Nausea 07/07/2017  . Intractable cyclical vomiting with nausea 07/07/2017  . Diarrhea 07/07/2017  .  Abdominal cramping 07/07/2017  . Allergic reaction 07/07/2017  . Multiple food allergies 07/12/2017  . Bouchard's nodes 12/17/2017  . Joint stiffness 12/17/2017  . Primary osteoarthritis of both hands 12/17/2017  . 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 Colon polyp 12/18/2017  . Grade I internal hemorrhoids 12/18/2017  . Increased pressure in the eye, left 01/02/2018  . Allergic urticaria 02/06/2018  . Angioedema 02/06/2018  . Food intolerance 02/06/2018  . Chronic rhinitis 02/06/2018  . Family history of psoriatic arthritis 04/08/2018  . Family history of morphea 04/08/2018  . Sjogren's syndrome with keratoconjunctivitis sicca ([place]) 04/30/2018   Resolved Ambulatory Problems    Diagnosis Date Noted  . Atypical chest pain 01/26/2015  . Benign paroxysmal positional vertigo 01/09/2017   Past Medical History:  Diagnosis Date  . Anxiety   . H/O total vaginal hysterectomy 05/2017  . Hypothyroidism    Reviewed medications and problem list.      Observations/Objective: No acute distress  .Marland Kitchen Today's Vitals   06/25/18 0852  BP: 130/78  Pulse: 78  Temp: 98.2 F (36.8 C)  TempSrc: Oral   There is no height or weight on file to calculate BMI.   Assessment and Plan: Marland KitchenMarland KitchenDiagnoses and all orders for this visit:  Acute non-recurrent pansinusitis -     amoxicillin-clavulanate (AUGMENTIN) 875-125 MG tablet; Take 1 tablet by mouth 2 (two) times daily. For 10 days.  vitals reassuring. Timeline suggest sinus infection. Treated with augmentin and add flonase 2 sprays each nostril. Discussed saline sinus rinses. Follow up as needed.   Follow Up Instructions:    I discussed the assessment and treatment plan  with the patient. The patient was provided an opportunity to ask questions and all were answered. The patient agreed with the plan and demonstrated an understanding of the instructions.   The patient was advised to call back or seek an in-person  evaluation if the symptoms worsen or if the condition fails to improve as anticipated.  I provided 7 minutes of non-face-to-face time during this encounter.   Iran Planas, PA-C

## 2018-06-29 ENCOUNTER — Other Ambulatory Visit: Payer: Self-pay | Admitting: Physician Assistant

## 2018-06-29 DIAGNOSIS — E039 Hypothyroidism, unspecified: Secondary | ICD-10-CM

## 2018-07-10 NOTE — Addendum Note (Signed)
Addended by: Alena Bills R on: 07/10/2018 12:01 PM   Modules accepted: Orders

## 2018-10-20 ENCOUNTER — Other Ambulatory Visit: Payer: Self-pay | Admitting: Physician Assistant

## 2018-10-20 DIAGNOSIS — E039 Hypothyroidism, unspecified: Secondary | ICD-10-CM

## 2018-10-28 NOTE — Progress Notes (Signed)
Office Visit Note  Patient: Heidi Carney             Date of Birth: Jun 23, 1965           MRN: [id number]             PCP: Lavada Mesi Referring: Donella Stade, PA-C Visit Date: 11/11/2018 Occupation: @GUAROCC @  Subjective: Dry mouth and dry eyes   History of Present Illness: Heidi Carney is a 53 y.o. female with history of Sjogren's and osteoarthritis.  She states she recently moved to the new house and was doing some yard work.  She has noticed some increased pain and stiffness in her hands.  She denies any joint swelling.  She was also having discomfort in her left knee joint for which her PCP prescribed diclofenac gel.  She also took some Advil.  She states the pain in her knee joint has resolved now.  She continues to have sicca symptoms for which she uses over-the-counter products.  She states that she never took pilocarpine.  She continues to have some fatigue.  Activities of Daily Living:  Patient reports morning stiffness for 10 minutes.   Patient Denies nocturnal pain.  Difficulty dressing/grooming: Denies Difficulty climbing stairs: Denies Difficulty getting out of chair: Denies Difficulty using hands for taps, buttons, cutlery, and/or writing: Denies  Review of Systems  Constitutional: Positive for fatigue. Negative for night sweats, weight gain and weight loss.  HENT: Positive for mouth dryness. Negative for mouth sores, trouble swallowing, trouble swallowing and nose dryness.   Eyes: Positive for dryness. Negative for pain, redness and visual disturbance.  Respiratory: Negative for cough, shortness of breath and difficulty breathing.   Cardiovascular: Negative for chest pain, palpitations, hypertension, irregular heartbeat and swelling in legs/feet.  Gastrointestinal: Negative for blood in stool, constipation and diarrhea.  Endocrine: Negative for increased urination.  Genitourinary: Negative for painful urination and vaginal dryness.   Musculoskeletal: Positive for arthralgias, joint pain, muscle weakness and morning stiffness. Negative for joint swelling, myalgias, muscle tenderness and myalgias.  Skin: Negative for color change, rash, hair loss, skin tightness, ulcers and sensitivity to sunlight.  Allergic/Immunologic: Negative for susceptible to infections.  Neurological: Negative for dizziness, memory loss, night sweats and weakness.  Hematological: Negative for bruising/bleeding tendency and swollen glands.  Psychiatric/Behavioral: Negative for depressed mood and sleep disturbance. The patient is not nervous/anxious.     PMFS History:  Patient Active Problem List   Diagnosis Date Noted  . Sjogren's syndrome with keratoconjunctivitis sicca ([place]) 04/30/2018  . Family history of psoriatic arthritis 04/08/2018  . Family history of morphea 04/08/2018  . Allergic urticaria 02/06/2018  . Angioedema 02/06/2018  . Food intolerance 02/06/2018  . Chronic rhinitis 02/06/2018  . Increased pressure in the eye, left 01/02/2018  . Colon polyp 12/18/2017  . Grade I internal hemorrhoids 12/18/2017  . Bouchard's nodes 12/17/2017  . Joint stiffness 12/17/2017  . Primary osteoarthritis of both hands 12/17/2017  . 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 Multiple food allergies 07/12/2017  . Nausea 07/07/2017  . Intractable cyclical vomiting with nausea 07/07/2017  . Diarrhea 07/07/2017  . Abdominal cramping 07/07/2017  . Allergic reaction 07/07/2017  . Change in stool 02/12/2017  . Left lower quadrant pain 01/24/2017  . Slow transit constipation 01/24/2017  . Right ovarian cyst 01/24/2017  . Essential hypertension 01/09/2017  . Migraine headache without aura 07/10/2016  . Hair loss 11/16/2015  . RLS (restless legs syndrome) 11/16/2015  . B12 deficiency  11/16/2015  . Vitamin D insufficiency 11/16/2015  . Perimenopausal symptoms 09/13/2015  . Overweight 03/17/2015  . Seborrheic dermatitis 03/10/2015   . GAD (generalized anxiety disorder) 01/26/2015  . Elevated blood pressure reading 01/26/2015  . Thyroid activity decreased 01/26/2015    Past Medical History:  Diagnosis Date  . Anxiety   . H/O total vaginal hysterectomy 05/2017  . Hypothyroidism     Family History  Problem Relation Age of Onset  . Diabetes Father   . Cancer Father        prostate CA  . Stroke Father   . Glaucoma Mother   . Asthma Sister   . Psoriasis Sister        psoriatic arthritis  . Arthritis Sister   . Scleroderma Sister   . Healthy Daughter   . Allergic rhinitis Neg Hx   . Angioedema Neg Hx   . Eczema Neg Hx   . Immunodeficiency Neg Hx   . Urticaria Neg Hx    Past Surgical History:  Procedure Laterality Date  . CESAREAN SECTION  2012  . EYE SURGERY    . LAPAROSCOPIC TOTAL HYSTERECTOMY  05/2017   Social History   Social History Narrative  . Not on file   Immunization History  Administered Date(s) Administered  . Influenza, Quadrivalent, Recombinant, Inj, Pf 12/21/2016, 03/15/2018  . Influenza,inj,Quad PF,6+ Mos 03/17/2015, 02/02/2016     Objective: Vital Signs: BP 125/75 (BP Location: Left Arm, Patient Position: Sitting, Cuff Size: Normal)   Pulse 85   Resp 16   Ht 5\' 4"  (1.626 m)   Wt 171 lb 3.2 oz (77.7 kg)   LMP 01/21/2015   BMI 29.39 kg/m    Physical Exam Vitals signs and nursing note reviewed.  Constitutional:      Appearance: She is well-developed.  HENT:     Head: Normocephalic and atraumatic.  Eyes:     Conjunctiva/sclera: Conjunctivae normal.  Neck:     Musculoskeletal: Normal range of motion.  Cardiovascular:     Rate and Rhythm: Normal rate and regular rhythm.     Heart sounds: Normal heart sounds.  Pulmonary:     Effort: Pulmonary effort is normal.     Breath sounds: Normal breath sounds.  Abdominal:     General: Bowel sounds are normal.     Palpations: Abdomen is soft.  Lymphadenopathy:     Cervical: No cervical adenopathy.  Skin:    General: Skin is  warm and dry.     Capillary Refill: Capillary refill takes less than 2 seconds.  Neurological:     Mental Status: She is alert and oriented to person, place, and time.  Psychiatric:        Behavior: Behavior normal.      Musculoskeletal Exam: C-spine thoracic and lumbar spine with good range of motion.  Shoulder joints elbow joints with good range of motion.  She has PIP and DIP thickening in her hands consistent with osteoarthritis.  Hip joints with good range of motion.  She is some discomfort range of motion of her left knee joint.  No warmth swelling or effusion was noted.  She has some DIP and PIP thickening in her feet. CDAI Exam: CDAI Score: - Patient Global: -; Provider Global: - Swollen: -; Tender: - Joint Exam   No joint exam has been documented for this visit   There is currently no information documented on the homunculus. Go to the Rheumatology activity and complete the homunculus joint exam.  Investigation: No additional  findings.  Imaging: Dg Knee 1-2 Views Right  Result Date: 10/31/2018 CLINICAL DATA:  Pain EXAM: RIGHT KNEE - 1-2 VIEW COMPARISON:  Left knee radiographs October 30, 2018 FINDINGS: Standing frontal, standing tunnel, and sunrise patellar images obtained. There is no fracture or dislocation. There is narrowing medially. No erosive changes. IMPRESSION: Joint space narrowing medially.  No fracture or dislocation. Electronically Signed   By: Lowella Grip III M.D.   On: 10/31/2018 08:26   Dg Knee Complete 4 Views Left  Result Date: 10/31/2018 CLINICAL DATA:  Pain for approximately 6 months EXAM: LEFT KNEE - COMPLETE 4+ VIEW COMPARISON:  None. FINDINGS: Standing frontal, standing tunnel, lateral, and sunrise patellar images were obtained. No fracture or dislocation. No appreciable joint effusion. There is moderate narrowing medially. There is slight narrowing in the patellofemoral joint. No erosive changes. There is a small spur along the anterior superior  patella. IMPRESSION: There is a degree of osteoarthritic change, primarily medially but also with slight narrowing of the patellofemoral joint. No fracture or dislocation. No evident joint effusion. Small spur along the anterior superior patella may represent a degree of distal quadriceps tendinosis. Electronically Signed   By: Lowella Grip III M.D.   On: 10/31/2018 08:28    Recent Labs: Lab Results  Component Value Date   WBC 5.6 02/06/2018   HGB 14.4 02/06/2018   PLT 177 02/06/2018   NA 141 02/06/2018   K 4.2 02/06/2018   CL 101 02/06/2018   CO2 26 02/06/2018   GLUCOSE 99 02/06/2018   BUN 8 02/06/2018   CREATININE 0.97 02/06/2018   BILITOT 0.4 02/06/2018   ALKPHOS 87 02/06/2018   AST 23 02/06/2018   ALT 23 02/06/2018   PROT 7.4 02/06/2018   ALBUMIN 4.4 02/06/2018   CALCIUM 9.4 02/06/2018   GFRAA 79 02/06/2018    Speciality Comments: No specialty comments available.  Procedures:  No procedures performed Allergies: Shrimp [shellfish allergy], Celexa [citalopram hydrobromide], Effexor [venlafaxine], Sulfur, and Synthroid [levothyroxine sodium]   Assessment / Plan:     Visit Diagnoses: Sjogren's syndrome with keratoconjunctivitis sicca ([facility]) - Sicca symptoms with positive ANA and positive Ro.   -Patient reports that she has been using over-the-counter products for sicca symptoms.  She decided not to take pilocarpine.  Her symptoms are manageable with the over-the-counter products.  Plan: CBC with Differential/Platelet, COMPLETE METABOLIC PANEL WITH GFR, today.  Primary osteoarthritis of both hands -she has been experiencing increased pain as she has been doing more yard work.  Joint protection and muscle strengthening was discussed.  A handout on hand exercises was given.  Chronic pain in left knee-patient states she has been having knee joint discomfort.  No warmth swelling or effusion was noted.  I have given her handout on knee exercises.  Chronic midline low back pain  without sciatica -she is currently not having much discomfort.  Vitamin D insufficiency -she takes over-the-counter vitamin D.  Seborrheic dermatitis   Increased pressure in the eye, left   Essential hypertension -blood pressure is controlled.  Migraine without aura and without status migrainosus, not intractable -   GAD (generalized anxiety disorder)   RLS (restless legs syndrome)   Family history of psoriatic arthritis - sister   Family history of morphea - sister   Orders: Orders Placed This Encounter  Procedures  . CBC with Differential/Platelet  . COMPLETE METABOLIC PANEL WITH GFR   No orders of the defined types were placed in this encounter.     Follow-Up Instructions:  Return in about 6 months (around 05/14/2019) for Sjogren's.   Bo Merino, MD  Note - This record has been created using Editor, commissioning.  Chart creation errors have been sought, but may not always  have been located. Such creation errors do not reflect on  the standard of medical care.

## 2018-10-30 ENCOUNTER — Ambulatory Visit (INDEPENDENT_AMBULATORY_CARE_PROVIDER_SITE_OTHER): Payer: BC Managed Care – PPO

## 2018-10-30 ENCOUNTER — Other Ambulatory Visit: Payer: Self-pay

## 2018-10-30 ENCOUNTER — Ambulatory Visit (INDEPENDENT_AMBULATORY_CARE_PROVIDER_SITE_OTHER): Payer: BC Managed Care – PPO | Admitting: Physician Assistant

## 2018-10-30 VITALS — BP 147/73 | HR 86 | Temp 98.0°F | Ht 64.0 in | Wt 168.0 lb

## 2018-10-30 DIAGNOSIS — F411 Generalized anxiety disorder: Secondary | ICD-10-CM

## 2018-10-30 DIAGNOSIS — I10 Essential (primary) hypertension: Secondary | ICD-10-CM | POA: Diagnosis not present

## 2018-10-30 DIAGNOSIS — E039 Hypothyroidism, unspecified: Secondary | ICD-10-CM

## 2018-10-30 DIAGNOSIS — M25562 Pain in left knee: Secondary | ICD-10-CM

## 2018-10-30 DIAGNOSIS — E538 Deficiency of other specified B group vitamins: Secondary | ICD-10-CM

## 2018-10-30 MED ORDER — VENLAFAXINE HCL ER 75 MG PO CP24: 75.0000 mg | ORAL_CAPSULE | Freq: Every day | ORAL | 0 refills | Status: DC

## 2018-10-30 MED ORDER — TIROSINT 75 MCG PO CAPS: ORAL_CAPSULE | ORAL | 1 refills | Status: DC

## 2018-10-30 NOTE — Patient Instructions (Signed)
Piriformis Syndrome Rehab Ask your health care provider which exercises are safe for you. Do exercises exactly as told by your health care provider and adjust them as directed. It is normal to feel mild stretching, pulling, tightness, or discomfort as you do these exercises. Stop right away if you feel sudden pain or your pain gets worse. Do not begin these exercises until told by your health care provider. Stretching and range-of-motion exercises These exercises warm up your muscles and joints and improve the movement and flexibility of your hip and pelvis. The exercises also help to relieve pain, numbness, and tingling. Hip rotation This is an exercise in which you lie on your back and stretch the muscles that rotate your hip (hip rotators) to stretch your buttocks. 1. Lie on your back on a firm surface. 2. Pull your left / right knee toward your same shoulder with your left / right hand until your knee is pointing toward the ceiling. Hold your left / right ankle with your other hand. 3. Keeping your knee steady, gently pull your left / right ankle toward your other shoulder until you feel a stretch in your buttocks. 4. Hold this position for __________ seconds. Repeat __________ times. Complete this exercise __________ times a day. Hip extensor This is an exercise in which you lie on your back and pull your knee to your chest. 1. Lie on your back on a firm surface. Both of your legs should be straight. 2. Pull your left / right knee to your chest. Hold your leg in this position by holding onto the back of your thigh or the front of your knee. 3. Hold this position for __________ seconds. 4. Slowly return to the starting position. Repeat __________ times. Complete this exercise __________ times a day. Strengthening exercises These exercises build strength and endurance in your hip and thigh muscles. Endurance is the ability to use your muscles for a long time, even after they get  tired. Straight leg raises, side-lying This exercise strengthens the muscles that rotate the leg at the hip and move it away from your body (hip abductors). 1. Lie on your side with your left / right leg in the top position. Lie so your head, shoulder, knee, and hip line up. Bend your bottom knee to help you balance. 2. Lift your top leg 4-6 inches (10-15 cm) while keeping your toes pointed straight ahead. 3. Hold this position for __________ seconds. 4. Slowly lower your leg to the starting position. 5. Let your muscles relax completely after each repetition. Repeat __________ times. Complete this exercise __________ times a day. Hip abduction and rotation This is sometimes called quadruped (on hands and knees) exercises. 1. Get on your hands and knees on a firm, lightly padded surface. Your hands should be directly below your shoulders, and your knees should be directly below your hips. 2. Lift your left / right knee out to the side. Keep your knee bent. Do not twist your body. 3. Hold this position for __________ seconds. 4. Slowly lower your leg. Repeat __________ times. Complete this exercise __________ times a day. Straight leg raises, face-down This exercise stretches the muscles that move your hips away from the front of the pelvis (hip extensors). 1. Lie on your abdomen on a bed or a firm surface with a pillow under your hips. 2. Squeeze your buttocks muscles and lift your left / right leg about 4-6 inches (10-15 cm) off the bed. Do not let your back arch. 3. Hold   this position for __________ seconds. 4. Slowly lower your leg to the starting position. 5. Let your muscles relax completely after each repetition. Repeat __________ times. Complete this exercise __________ times a day. This information is not intended to replace advice given to you by your health care provider. Make sure you discuss any questions you have with your health care provider. Document Released: 03/20/2005  Document Revised: 07/11/2018 Document Reviewed: 01/10/2018 Elsevier Patient Education  2020 [place]. Quadriceps Strain Rehab Ask your health care provider which exercises are safe for you. Do exercises exactly as told by your health care provider and adjust them as directed. It is normal to feel mild stretching, pulling, tightness, or discomfort as you do these exercises. Stop right away if you feel sudden pain or your pain gets worse. Do not begin these exercises until told by your health care provider. Stretching and range-of-motion exercises These exercises warm up your muscles and joints and improve the movement and flexibility of your thigh. These exercises can also help to relieve stiffness or swelling. Heel slides  5. Lie on your back with both legs straight. If this causes back discomfort, bend the knee of your healthy leg so your foot is flat on the floor. 6. Slowly slide your left / right heel back toward your buttocks. Stop when you feel a gentle stretch in the front of your knee or thigh (quadriceps). 7. Hold this position for __________ seconds. 8. Slowly slide your left / right heel back to the starting position. Repeat __________ times. Complete this exercise __________ times a day. Quadriceps stretch, prone  5. Lie on your abdomen on a firm surface, such as a bed or padded floor (prone position). 6. Bend your left / right knee and hold your ankle. If you cannot reach your ankle or pant leg, loop a belt around your foot and grab the belt instead. 7. Gently pull your heel toward your buttocks. Your knee should not slide out to the side. You should feel a stretch in the front of your thigh and knee (quadriceps). 8. Hold this position for __________ seconds. Repeat __________ times. Complete this exercise __________ times a day. Strengthening exercises These exercises build strength and endurance in your thigh. Endurance is the ability to use your muscles for a long time, even  after your muscles get tired. Straight leg raises, supine This exercise stretches the muscles in front of your thigh (quadriceps) and the muscles that move your hips (hip flexors). Quality counts! Watch for signs that the quadriceps muscle is working to ensure that you are strengthening the correct muscles and not cheating by using healthier muscles. 6. Lie on your back (supine position) with your left / right leg extended and your other knee bent. 7. Tense the muscles in the front of your left / right thigh. You should see your kneecap slide up or see increased dimpling just above the knee. 8. Tighten these muscles even more and raise your leg 4-6 inches (10-15 cm) off the floor. 9. Hold this position for __________ seconds. 10. Keep the thigh muscles tense as you lower your leg. 11. Relax the muscles slowly and completely after each repetition. Repeat __________ times. Complete this exercise __________ times a day. Leg raises, prone This exercise strengthens the muscles that move the hips (hip extensors). 5. Lie on your abdomen on a bed or a firm surface (prone position). Place a pillow under your hips. 6. Bend your left / right knee so your foot is straight  up in the air. 7. Squeeze your buttocks muscles and lift your left / right thigh off the bed. Do not let your back arch. 8. Hold this position for __________ seconds. 9. Slowly return to the starting position. Let your muscles relax completely before doing another repetition. Repeat __________ times. Complete this exercise __________ times a day. Wall sits Follow the directions for form closely. Knee pain can occur if your feet or knees are not placed properly. 6. Lean your back against a smooth wall or door, and walk your feet out 18-24 inches (46-61 cm) from it. 7. Place your feet hip-width apart. 8. Slowly slide down the wall or door until your knees bend __________ degrees. Keep your weight back and over your heels, not over your  toes. Keep your thighs straight or pointing slightly outward. 9. Hold this position for __________ seconds. 10. Use your thigh and buttocks muscles to push yourself back up to a standing position. Keep your weight through your heels while you do this. 11. Rest for __________ seconds after each repetition. Repeat __________ times. Complete this exercise __________ times a day. This information is not intended to replace advice given to you by your health care provider. Make sure you discuss any questions you have with your health care provider. Document Released: 03/20/2005 Document Revised: 07/12/2018 Document Reviewed: 01/10/2018 Elsevier Patient Education  2020 [place] Strain Rehab Ask your health care provider which exercises are safe for you. Do exercises exactly as told by your health care provider and adjust them as directed. It is normal to feel mild stretching, pulling, tightness, or discomfort as you do these exercises. Stop right away if you feel sudden pain or your pain gets worse. Do not begin these exercises until told by your health care provider. Stretching and range-of-motion exercises These exercises warm up your muscles and joints and improve the movement and flexibility of your thighs. These exercises also help to relieve pain, numbness, and tingling. Talk to your health care provider about these restrictions. Knee extension, seated  1. Sit with your left / right heel propped on a chair, a coffee table, or a footstool. Do not have anything under your knee to support it. 2. Allow your leg muscles to relax, letting gravity straighten out your knee (extension). You should feel a stretch behind your left / right knee. 3. If told by your health care provider, deepen the stretch by placing a __________ weight on your thigh, just above your kneecap. 4. Hold this position for __________ seconds. Repeat __________ times. Complete this exercise __________ times a  day. Seated stretch This exercise is sometimes called hamstrings and adductors stretch. 1. Sit on the floor with your legs stretched wide. Keep your knees straight during this exercise. 2. Keeping your head and back in a straight line, bend at your waist to reach for your left foot (position A). You should feel a stretch in your right inner thigh (adductors). 3. Hold this position for __________ seconds. Then slowly return to the upright position. 4. Keeping your head and back in a straight line, bend at your waist to reach forward (position B). You should feel a stretch behind both of your thighs or knees (hamstrings). 5. Hold this position for __________ seconds. Then slowly return to the upright position. 6. Keeping your head and back in a straight line, bend at your waist to reach for your right foot (position C). You should feel a stretch in your left inner thigh (adductors). 7.  Hold this position for __________ seconds. Then slowly return to the upright position. Repeat __________ times. Complete this exercise __________ times a day. Hamstrings stretch, supine  1. Lie on your back (supine position). 2. Loop a belt or towel over the ball of your left / right foot. The ball of your foot is on the walking surface, right under your toes. 3. Straighten your left / right knee and slowly pull on the belt or towel to raise your leg. ? Do not let your left / right knee bend while you do this. ? Keep your other leg flat on the floor. ? Raise the left / right leg until you feel a gentle stretch behind your left / right knee or thigh (hamstrings). 4. Hold this position for __________ seconds. 5. Slowly return your leg to the starting position. Repeat __________ times. Complete this exercise __________ times a day. Strengthening exercises These exercises build strength and endurance in your thighs. Endurance is the ability to use your muscles for a long time, even after they get tired. Straight leg  raises, prone This exercise strengthens the muscles that move the hips (hip extensors). 1. Lie on your abdomen on a firm surface (prone position). 2. Tense the muscles in your buttocks and lift your left / right leg about 4 inches (10 cm). Keep your knee straight as you lift your leg. If you cannot lift your leg that high without arching your back, place a pillow under your hips. 3. Hold the position for __________ seconds. 4. Slowly lower your leg to the starting position. 5. Allow your muscles to relax completely before you start the next repetition. Repeat __________ times. Complete this exercise __________ times a day. Bridge This exercise strengthens the muscles in your buttocks and the back of your thighs (hip extensors). 1. Lie on your back on a firm surface with your knees bent and your feet flat on the floor. 2. Tighten your buttocks muscles and lift your bottom off the floor until the trunk of your body is level with your thighs. ? You should feel the muscles working in your buttocks and the back of your thighs. ? Do not arch your back. 3. Hold this position for __________ seconds. 4. Slowly lower your hips to the starting position. 5. Let your buttocks muscles relax completely between repetitions. 6. If told by your health care provider, keep your bottom lifted off the floor while you slowly walk your feet away from you as far as you can control. Hold for __________ seconds, then slowly walk your feet back toward you. Repeat __________ times. Complete this exercise __________ times a day. Lateral walking with band This is an exercise in which you walk sideways (lateral), with tension provided by an exercise band. The exercise strengthens the muscles in your hip (hip abductors). 1. Stand in a long hallway. 2. Wrap a loop of exercise band around your legs, just above your knees. 3. Bend your knees gently and drop your hips down and back so your weight is over your heels. 4. Step to  the side to move down the length of the hallway, keeping your toes pointed ahead of you and keeping tension in the band. 5. Repeat, leading with your other leg. Repeat __________ times. Complete this exercise __________ times a day. Single leg stand with reaching This exercise is also called eccentric hamstring stretch. 1. Stand on your left / right foot. Keep your big toe down on the floor and try to keep your  arch lifted. 2. Slowly reach down toward the floor as far as you can while keeping your balance. Lowering your thigh under tension is called eccentric stretching. 3. Hold this position for __________ seconds. Repeat __________ times. Complete this exercise __________ times a day. Plank, prone This exercise strengthens muscles in your abdomen and core area. 1. Lie on your abdomen on the floor (prone position),and prop yourself up on your elbows. Your hands should be straight out in front of you, and your elbows should be below your shoulders. Position your feet similar to a push-up position so your toes are on the ground. 2. Tighten your abdominal muscles and lift your body off the floor. ? Do not arch your back. ? Do not hold your breath. 3. Hold this position for __________ seconds. Repeat __________ times. Complete this exercise __________ times a day. This information is not intended to replace advice given to you by your health care provider. Make sure you discuss any questions you have with your health care provider. Document Released: 03/20/2005 Document Revised: 07/11/2018 Document Reviewed: 03/18/2018 Elsevier Patient Education  2020 [employer].

## 2018-10-30 NOTE — Progress Notes (Signed)
Subjective:    Patient ID: Heidi Carney, female    DOB: 09-29-1965, 53 y.o.   MRN: [id number]  HPI Pt is a 53 yo female with GAD, hypothyroidism, HTN who presents to the clinic to discuss medications.   Pt would like to come down on her effexor. Her mood is really good. She increased effexor during a really stressful time in life. She is in a good place and would like to decrease dose. No SI/HC.   No problems with thyroid. She does need refills. She is tolerating tirosinct really well.    No CP, palpitations, headaches or vision changes. Taking BP medication daily. Not checking blood pressure.   She is having some left knee and leg issues. She is not really having pain but a "pulling sensation in left knee". She feels like when she stands her left knee is being pulled inward. She feels like she cannot stand with leg and foot straight without a lot of tightness. She denies any exercise. No injury. Not doing anything to improve her symptoms. Occasionally she will feel some tenderness in her lateral knee.   .. Active Ambulatory Problems    Diagnosis Date Noted  . GAD (generalized anxiety disorder) 01/26/2015  . Elevated blood pressure reading 01/26/2015  . Thyroid activity decreased 01/26/2015  . Seborrheic dermatitis 03/10/2015  . Overweight 03/17/2015  . Perimenopausal symptoms 09/13/2015  . Hair loss 11/16/2015  . RLS (restless legs syndrome) 11/16/2015  . B12 deficiency 11/16/2015  . Vitamin D insufficiency 11/16/2015  . Migraine headache without aura 07/10/2016  . Essential hypertension 01/09/2017  . Left lower quadrant pain 01/24/2017  . Slow transit constipation 01/24/2017  . Right ovarian cyst 01/24/2017  . Change in stool 02/12/2017  . Nausea 07/07/2017  . Intractable cyclical vomiting with nausea 07/07/2017  . Diarrhea 07/07/2017  . Abdominal cramping 07/07/2017  . Allergic reaction 07/07/2017  . Multiple food allergies 07/12/2017  . Bouchard's nodes 12/17/2017  .  Joint stiffness 12/17/2017  . Primary osteoarthritis of both hands 12/17/2017  . 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 Colon polyp 12/18/2017  . Grade I internal hemorrhoids 12/18/2017  . Increased pressure in the eye, left 01/02/2018  . Allergic urticaria 02/06/2018  . Angioedema 02/06/2018  . Food intolerance 02/06/2018  . Chronic rhinitis 02/06/2018  . Family history of psoriatic arthritis 04/08/2018  . Family history of morphea 04/08/2018  . Sjogren's syndrome with keratoconjunctivitis sicca ([place]) 04/30/2018   Resolved Ambulatory Problems    Diagnosis Date Noted  . Atypical chest pain 01/26/2015  . Benign paroxysmal positional vertigo 01/09/2017   Past Medical History:  Diagnosis Date  . Anxiety   . H/O total vaginal hysterectomy 05/2017  . Hypothyroidism      Review of Systems See HPI.     Objective:   Physical Exam Vitals signs reviewed.  Constitutional:      Appearance: Normal appearance.  Cardiovascular:     Rate and Rhythm: Normal rate and regular rhythm.     Pulses: Normal pulses.  Pulmonary:     Effort: Pulmonary effort is normal.  Musculoskeletal:     Comments: Normal ROM of left knee.  Strength 5/5 of both knees.  No tenderness to palpation over left knee. No swelling or bruising.  Negative mcmurrays.  No laxity of left knee.   Neurological:     General: No focal deficit present.     Mental Status: She is alert and oriented to person, place, and time.  Psychiatric:  Mood and Affect: Mood normal.           Assessment & Plan:  Marland KitchenMarland KitchenAino was seen today for foot problem and anxiety.  Diagnoses and all orders for this visit:  Acute pain of left knee -     Cancel: DG Knee Bilateral Standing AP -     DG Knee 1-2 Views Right; Future -     DG Knee Complete 4 Views Left -     diclofenac sodium (VOLTAREN) 1 % GEL; Apply 4 g topically 4 (four) times daily. To affected joint.  Acquired hypothyroidism -      Levothyroxine Sodium (TIROSINT) 75 MCG CAPS; TAKE 1 CAPSULE (75 MCG TOTAL) BY MOUTH DAILY BEFORE BREAKFAST. -     TSH  B12 deficiency -     B12 and Folate Panel  Essential hypertension  GAD (generalized anxiety disorder) -     venlafaxine XR (EFFEXOR XR) 75 MG 24 hr capsule; Take 1 capsule (75 mg total) by mouth daily with breakfast.   .. Depression screen [facility] 2/9 10/30/2018 03/15/2018 12/14/2017 07/05/2017 01/09/2017  Decreased Interest 0 1 0 0 1  Down, Depressed, Hopeless 0 2 1 0 0  PHQ - 2 Score 0 3 1 0 1  Altered sleeping 0 2 1 - 0  Tired, decreased energy 0 2 1 - 1  Change in appetite 0 2 1 - 0  Feeling bad or failure about yourself  1 1 - - 1  Trouble concentrating 0 3 0 - 0  Moving slowly or fidgety/restless 0 0 0 - 0  Suicidal thoughts 0 0 0 - 0  PHQ-9 Score 1 13 4  - 3  Difficult doing work/chores Not difficult at all Somewhat difficult Somewhat difficult - Not difficult at all   .Marland Kitchen GAD 7 : Generalized Anxiety Score 10/30/2018 03/15/2018 12/14/2017 09/13/2015  Nervous, Anxious, on Edge 1 3 2 1   Control/stop worrying 0 0 0 0  Worry too much - different things 0 3 2 1   Trouble relaxing 0 2 0 0  Restless 0 2 1 0  Easily annoyed or irritable 1 3 2 1   Afraid - awful might happen 0 0 0 0  Total GAD 7 Score 2 13 7 3   Anxiety Difficulty Not difficult at all Somewhat difficult Somewhat difficult Not difficult at all    Discussed taper of effexor. Start with 86mcg daily. If too much of a drop could consider adding 37.5 to the 76mcg and taper down a little slower. I would like for her to stay at next dose for 6-8 weeks.   Labs ordered and medications refilled.   No obvious deformity of left knee. Will get xrays could be some arthritis. I suspect more some tight quads or hamstring pulling on knee. Gave her exercises for both. Ice knee regularly. Use voltaren gel as needed. Follow up in 4 weeks if not improving.

## 2018-10-31 LAB — TSH: TSH: 3.88 mIU/L

## 2018-10-31 LAB — B12 AND FOLATE PANEL
Folate: 13 ng/mL
Vitamin B-12: 972 pg/mL (ref 200–1100)

## 2018-10-31 MED ORDER — DICLOFENAC SODIUM 1 % TD GEL: 4.0000 g | Freq: Four times a day (QID) | TRANSDERMAL | 1 refills | Status: AC

## 2018-10-31 NOTE — Progress Notes (Signed)
B12 is great!!!

## 2018-10-31 NOTE — Progress Notes (Signed)
You have some osteoarthritis in the knee mostly inner knee. You have a small spur along the patella and appearance of some quadricept tendinosi(inflammation). Continue stretches given. Focus on quads. I will send some voltaren gel to rub up to 4 times a day to over painful areas. Suggest as needed ibuprofen twice a day for next 2-3 days to decrease some inflammation.  Ice knee.  No extensive exercise/walking/squats.

## 2018-10-31 NOTE — Progress Notes (Signed)
TSH in normal range. Keep same dose.

## 2018-11-01 ENCOUNTER — Encounter: Payer: Self-pay | Admitting: Physician Assistant

## 2018-11-11 ENCOUNTER — Ambulatory Visit: Payer: BC Managed Care – PPO | Admitting: Rheumatology

## 2018-11-11 ENCOUNTER — Other Ambulatory Visit: Payer: Self-pay

## 2018-11-11 ENCOUNTER — Encounter: Payer: Self-pay | Admitting: Rheumatology

## 2018-11-11 VITALS — BP 125/75 | HR 85 | Resp 16 | Ht 64.0 in | Wt 171.2 lb

## 2018-11-11 DIAGNOSIS — M19041 Primary osteoarthritis, right hand: Secondary | ICD-10-CM | POA: Diagnosis not present

## 2018-11-11 DIAGNOSIS — E559 Vitamin D deficiency, unspecified: Secondary | ICD-10-CM | POA: Diagnosis not present

## 2018-11-11 DIAGNOSIS — M25562 Pain in left knee: Secondary | ICD-10-CM

## 2018-11-11 DIAGNOSIS — M545 Low back pain: Secondary | ICD-10-CM | POA: Diagnosis not present

## 2018-11-11 DIAGNOSIS — I10 Essential (primary) hypertension: Secondary | ICD-10-CM

## 2018-11-11 DIAGNOSIS — M3501 Sicca syndrome with keratoconjunctivitis: Secondary | ICD-10-CM

## 2018-11-11 DIAGNOSIS — G2581 Restless legs syndrome: Secondary | ICD-10-CM

## 2018-11-11 DIAGNOSIS — Z840 Family history of diseases of the skin and subcutaneous tissue: Secondary | ICD-10-CM

## 2018-11-11 DIAGNOSIS — G8929 Other chronic pain: Secondary | ICD-10-CM

## 2018-11-11 DIAGNOSIS — F411 Generalized anxiety disorder: Secondary | ICD-10-CM

## 2018-11-11 DIAGNOSIS — H40052 Ocular hypertension, left eye: Secondary | ICD-10-CM

## 2018-11-11 DIAGNOSIS — G43009 Migraine without aura, not intractable, without status migrainosus: Secondary | ICD-10-CM

## 2018-11-11 DIAGNOSIS — M19042 Primary osteoarthritis, left hand: Secondary | ICD-10-CM

## 2018-11-11 DIAGNOSIS — Z8261 Family history of arthritis: Secondary | ICD-10-CM

## 2018-11-11 DIAGNOSIS — L219 Seborrheic dermatitis, unspecified: Secondary | ICD-10-CM

## 2018-11-11 NOTE — Patient Instructions (Signed)
Journal for Nurse Practitioners, 15(4), 263-267. Retrieved January 07, 2018 from [email or web address].[email or web address]">  Knee Exercises Ask your health care provider which exercises are safe for you. Do exercises exactly as told by your health care provider and adjust them as directed. It is normal to feel mild stretching, pulling, tightness, or discomfort as you do these exercises. Stop right away if you feel sudden pain or your pain gets worse. Do not begin these exercises until told by your health care provider. Stretching and range-of-motion exercises These exercises warm up your muscles and joints and improve the movement and flexibility of your knee. These exercises also help to relieve pain and swelling. Knee extension, prone 1. Lie on your abdomen (prone position) on a bed. 2. Place your left / right knee just beyond the edge of the surface so your knee is not on the bed. You can put a towel under your left / right thigh just above your kneecap for comfort. 3. Relax your leg muscles and allow gravity to straighten your knee (extension). You should feel a stretch behind your left / right knee. 4. Hold this position for __________ seconds. 5. Scoot up so your knee is supported between repetitions. Repeat __________ times. Complete this exercise __________ times a day. Knee flexion, active  1. Lie on your back with both legs straight. If this causes back discomfort, bend your left / right knee so your foot is flat on the floor. 2. Slowly slide your left / right heel back toward your buttocks. Stop when you feel a gentle stretch in the front of your knee or thigh (flexion). 3. Hold this position for __________ seconds. 4. Slowly slide your left / right heel back to the starting position. Repeat __________ times. Complete this exercise __________ times a day. Quadriceps stretch, prone  1. Lie on your abdomen on a firm surface, such as a bed or padded floor. 2. Bend your left / right knee and hold  your ankle. If you cannot reach your ankle or pant leg, loop a belt around your foot and grab the belt instead. 3. Gently pull your heel toward your buttocks. Your knee should not slide out to the side. You should feel a stretch in the front of your thigh and knee (quadriceps). 4. Hold this position for __________ seconds. Repeat __________ times. Complete this exercise __________ times a day. Hamstring, supine 1. Lie on your back (supine position). 2. Loop a belt or towel over the ball of your left / right foot. The ball of your foot is on the walking surface, right under your toes. 3. Straighten your left / right knee and slowly pull on the belt to raise your leg until you feel a gentle stretch behind your knee (hamstring). ? Do not let your knee bend while you do this. ? Keep your other leg flat on the floor. 4. Hold this position for __________ seconds. Repeat __________ times. Complete this exercise __________ times a day. Strengthening exercises These exercises build strength and endurance in your knee. Endurance is the ability to use your muscles for a long time, even after they get tired. Quadriceps, isometric This exercise stretches the muscles in front of your thigh (quadriceps) without moving your knee joint (isometric). 1. Lie on your back with your left / right leg extended and your other knee bent. Put a rolled towel or small pillow under your knee if told by your health care provider. 2. Slowly tense the muscles in the front of your left /   right thigh. You should see your kneecap slide up toward your hip or see increased dimpling just above the knee. This motion will push the back of the knee toward the floor. 3. For __________ seconds, hold the muscle as tight as you can without increasing your pain. 4. Relax the muscles slowly and completely. Repeat __________ times. Complete this exercise __________ times a day. Straight leg raises This exercise stretches the muscles in front  of your thigh (quadriceps) and the muscles that move your hips (hip flexors). 1. Lie on your back with your left / right leg extended and your other knee bent. 2. Tense the muscles in the front of your left / right thigh. You should see your kneecap slide up or see increased dimpling just above the knee. Your thigh may even shake a bit. 3. Keep these muscles tight as you raise your leg 4-6 inches (10-15 cm) off the floor. Do not let your knee bend. 4. Hold this position for __________ seconds. 5. Keep these muscles tense as you lower your leg. 6. Relax your muscles slowly and completely after each repetition. Repeat __________ times. Complete this exercise __________ times a day. Hamstring, isometric 1. Lie on your back on a firm surface. 2. Bend your left / right knee about __________ degrees. 3. Dig your left / right heel into the surface as if you are trying to pull it toward your buttocks. Tighten the muscles in the back of your thighs (hamstring) to "dig" as hard as you can without increasing any pain. 4. Hold this position for __________ seconds. 5. Release the tension gradually and allow your muscles to relax completely for __________ seconds after each repetition. Repeat __________ times. Complete this exercise __________ times a day. Hamstring curls If told by your health care provider, do this exercise while wearing ankle weights. Begin with __________ lb weights. Then increase the weight by 1 lb (0.5 kg) increments. Do not wear ankle weights that are more than __________ lb. 1. Lie on your abdomen with your legs straight. 2. Bend your left / right knee as far as you can without feeling pain. Keep your hips flat against the floor. 3. Hold this position for __________ seconds. 4. Slowly lower your leg to the starting position. Repeat __________ times. Complete this exercise __________ times a day. Squats This exercise strengthens the muscles in front of your thigh and knee  (quadriceps). 1. Stand in front of a table, with your feet and knees pointing straight ahead. You may rest your hands on the table for balance but not for support. 2. Slowly bend your knees and lower your hips like you are going to sit in a chair. ? Keep your weight over your heels, not over your toes. ? Keep your lower legs upright so they are parallel with the table legs. ? Do not let your hips go lower than your knees. ? Do not bend lower than told by your health care provider. ? If your knee pain increases, do not bend as low. 3. Hold the squat position for __________ seconds. 4. Slowly push with your legs to return to standing. Do not use your hands to pull yourself to standing. Repeat __________ times. Complete this exercise __________ times a day. Wall slides This exercise strengthens the muscles in front of your thigh and knee (quadriceps). 1. Lean your back against a smooth wall or door, and walk your feet out 18-24 inches (46-61 cm) from it. 2. Place your feet hip-width apart. 3.   Slowly slide down the wall or door until your knees bend __________ degrees. Keep your knees over your heels, not over your toes. Keep your knees in line with your hips. 4. Hold this position for __________ seconds. Repeat __________ times. Complete this exercise __________ times a day. Straight leg raises This exercise strengthens the muscles that rotate the leg at the hip and move it away from your body (hip abductors). 1. Lie on your side with your left / right leg in the top position. Lie so your head, shoulder, knee, and hip line up. You may bend your bottom knee to help you keep your balance. 2. Roll your hips slightly forward so your hips are stacked directly over each other and your left / right knee is facing forward. 3. Leading with your heel, lift your top leg 4-6 inches (10-15 cm). You should feel the muscles in your outer hip lifting. ? Do not let your foot drift forward. ? Do not let your knee  roll toward the ceiling. 4. Hold this position for __________ seconds. 5. Slowly return your leg to the starting position. 6. Let your muscles relax completely after each repetition. Repeat __________ times. Complete this exercise __________ times a day. Straight leg raises This exercise stretches the muscles that move your hips away from the front of the pelvis (hip extensors). 1. Lie on your abdomen on a firm surface. You can put a pillow under your hips if that is more comfortable. 2. Tense the muscles in your buttocks and lift your left / right leg about 4-6 inches (10-15 cm). Keep your knee straight as you lift your leg. 3. Hold this position for __________ seconds. 4. Slowly lower your leg to the starting position. 5. Let your leg relax completely after each repetition. Repeat __________ times. Complete this exercise __________ times a day. This information is not intended to replace advice given to you by your health care provider. Make sure you discuss any questions you have with your health care provider. Document Released: 02/01/2005 Document Revised: 01/08/2018 Document Reviewed: 01/08/2018 [employer] Patient Education  2020 [employer] Hand Exercises Hand exercises can be helpful for almost anyone. These exercises can strengthen the hands, improve flexibility and movement, and increase blood flow to the hands. These results can make work and daily tasks easier. Hand exercises can be especially helpful for people who have joint pain from arthritis or have nerve damage from overuse (carpal tunnel syndrome). These exercises can also help people who have injured a hand. Exercises Most of these hand exercises are gentle stretching and motion exercises. It is usually safe to do them often throughout the day. Warming up your hands before exercise may help to reduce stiffness. You can do this with gentle massage or by placing your hands in warm water for 10-15 minutes. It is normal to feel  some stretching, pulling, tightness, or mild discomfort as you begin new exercises. This will gradually improve. Stop an exercise right away if you feel sudden, severe pain or your pain gets worse. Ask your health care provider which exercises are best for you. Knuckle bend or "claw" fist 6. Stand or sit with your arm, hand, and all five fingers pointed straight up. Make sure to keep your wrist straight during the exercise. 7. Gently bend your fingers down toward your palm until the tips of your fingers are touching the top of your palm. Keep your big knuckle straight and just bend the small knuckles in your fingers. 8. Hold   this position for __________ seconds. 9. Straighten (extend) your fingers back to the starting position. Repeat this exercise 5-10 times with each hand. Full finger fist 5. Stand or sit with your arm, hand, and all five fingers pointed straight up. Make sure to keep your wrist straight during the exercise. 6. Gently bend your fingers into your palm until the tips of your fingers are touching the middle of your palm. 7. Hold this position for __________ seconds. 8. Extend your fingers back to the starting position, stretching every joint fully. Repeat this exercise 5-10 times with each hand. Straight fist 5. Stand or sit with your arm, hand, and all five fingers pointed straight up. Make sure to keep your wrist straight during the exercise. 6. Gently bend your fingers at the big knuckle, where your fingers meet your hand, and the middle knuckle. Keep the knuckle at the tips of your fingers straight and try to touch the bottom of your palm. 7. Hold this position for __________ seconds. 8. Extend your fingers back to the starting position, stretching every joint fully. Repeat this exercise 5-10 times with each hand. Tabletop 1. Stand or sit with your arm, hand, and all five fingers pointed straight up. Make sure to keep your wrist straight during the exercise. 2. Gently bend  your fingers at the big knuckle, where your fingers meet your hand, as far down as you can while keeping the small knuckles in your fingers straight. Think of forming a tabletop with your fingers. 3. Hold this position for __________ seconds. 4. Extend your fingers back to the starting position, stretching every joint fully. Repeat this exercise 5-10 times with each hand. Finger spread 5. Place your hand flat on a table with your palm facing down. Make sure your wrist stays straight as you do this exercise. 6. Spread your fingers and thumb apart from each other as far as you can until you feel a gentle stretch. Hold this position for __________ seconds. 7. Bring your fingers and thumb tight together again. Hold this position for __________ seconds. Repeat this exercise 5-10 times with each hand. Making circles 7. Stand or sit with your arm, hand, and all five fingers pointed straight up. Make sure to keep your wrist straight during the exercise. 8. Make a circle by touching the tip of your thumb to the tip of your index finger. 9. Hold for __________ seconds. Then open your hand wide. 10. Repeat this motion with your thumb and each finger on your hand. Repeat this exercise 5-10 times with each hand. Thumb motion 6. Sit with your forearm resting on a table and your wrist straight. Your thumb should be facing up toward the ceiling. Keep your fingers relaxed as you move your thumb. 7. Lift your thumb up as high as you can toward the ceiling. Hold for __________ seconds. 8. Bend your thumb across your palm as far as you can, reaching the tip of your thumb for the small finger (pinkie) side of your palm. Hold for __________ seconds. Repeat this exercise 5-10 times with each hand. Grip strengthening  5. Hold a stress ball or other soft ball in the middle of your hand. 6. Slowly increase the pressure, squeezing the ball as much as you can without causing pain. Think of bringing the tips of your  fingers into the middle of your palm. All of your finger joints should bend when doing this exercise. 7. Hold your squeeze for __________ seconds, then relax. Repeat this exercise 5-10   times with each hand. Contact a health care provider if:  Your hand pain or discomfort gets much worse when you do an exercise.  Your hand pain or discomfort does not improve within 2 hours after you exercise. If you have any of these problems, stop doing these exercises right away. Do not do them again unless your health care provider says that you can. Get help right away if:  You develop sudden, severe hand pain or swelling. If this happens, stop doing these exercises right away. Do not do them again unless your health care provider says that you can. This information is not intended to replace advice given to you by your health care provider. Make sure you discuss any questions you have with your health care provider. Document Released: 03/01/2015 Document Revised: 07/11/2018 Document Reviewed: 03/21/2018 Elsevier Patient Education  2020 [employer]  

## 2018-11-12 LAB — COMPLETE METABOLIC PANEL WITH GFR
AG Ratio: 1.4 (calc) (ref 1.0–2.5)
ALT: 20 U/L (ref 6–29)
AST: 22 U/L (ref 10–35)
Albumin: 4.4 g/dL (ref 3.6–5.1)
Alkaline phosphatase (APISO): 79 U/L (ref 37–153)
BUN: 9 mg/dL (ref 7–25)
CO2: 28 mmol/L (ref 20–32)
Calcium: 9.4 mg/dL (ref 8.6–10.4)
Chloride: 103 mmol/L (ref 98–110)
Creat: 0.9 mg/dL (ref 0.50–1.05)
GFR, Est African American: 86 mL/min/{1.73_m2} (ref >=60)
GFR, Est Non African American: 74 mL/min/{1.73_m2} (ref >=60)
Globulin: 3.1 g/dL (calc) (ref 1.9–3.7)
Glucose, Bld: 81 mg/dL (ref 65–99)
Potassium: 4.2 mmol/L (ref 3.5–5.3)
Sodium: 141 mmol/L (ref 135–146)
Total Bilirubin: 0.5 mg/dL (ref 0.2–1.2)
Total Protein: 7.5 g/dL (ref 6.1–8.1)

## 2018-11-12 LAB — CBC WITH DIFFERENTIAL/PLATELET
Absolute Monocytes: 534 cells/uL (ref 200–950)
Basophils Absolute: 41 cells/uL (ref 0–200)
Basophils Relative: 0.7 %
Eosinophils Absolute: 278 cells/uL (ref 15–500)
Eosinophils Relative: 4.8 %
HCT: 40.1 % (ref 35.0–45.0)
Hemoglobin: 13.8 g/dL (ref 11.7–15.5)
Lymphs Abs: 1955 cells/uL (ref 850–3900)
MCH: 35.5 pg — ABNORMAL HIGH (ref 27.0–33.0)
MCHC: 34.4 g/dL (ref 32.0–36.0)
MCV: 103.1 fL — ABNORMAL HIGH (ref 80.0–100.0)
MPV: 10.6 fL (ref 7.5–12.5)
Monocytes Relative: 9.2 %
Neutro Abs: 2993 cells/uL (ref 1500–7800)
Neutrophils Relative %: 51.6 %
Platelets: 172 10*3/uL (ref 140–400)
RBC: 3.89 10*6/uL (ref 3.80–5.10)
RDW: 12 % (ref 11.0–15.0)
Total Lymphocyte: 33.7 %
WBC: 5.8 10*3/uL (ref 3.8–10.8)

## 2018-11-12 NOTE — Progress Notes (Signed)
Labs are stable.

## 2018-11-25 ENCOUNTER — Telehealth (INDEPENDENT_AMBULATORY_CARE_PROVIDER_SITE_OTHER): Payer: BC Managed Care – PPO | Admitting: Physician Assistant

## 2018-11-25 VITALS — BP 132/74 | Temp 97.6°F | Ht 64.0 in | Wt 168.0 lb

## 2018-11-25 DIAGNOSIS — R112 Nausea with vomiting, unspecified: Secondary | ICD-10-CM

## 2018-11-25 DIAGNOSIS — F411 Generalized anxiety disorder: Secondary | ICD-10-CM | POA: Diagnosis not present

## 2018-11-25 DIAGNOSIS — Z9102 Food additives allergy status: Secondary | ICD-10-CM

## 2018-11-25 DIAGNOSIS — T781XXS Other adverse food reactions, not elsewhere classified, sequela: Secondary | ICD-10-CM | POA: Diagnosis not present

## 2018-11-25 NOTE — Progress Notes (Deleted)
Has had vomiting off and on.  Was able to manage with diet.  Thursday ate a cheescake/cake at night - then woke up 3 am with vomiting/diarrhea. Happened for 24 hours.  She doesn't feel completely back to normal yet.  Has seen GI in the past, referred to allergist. No test for sulfite allergy, but has reaction when she eats it.   Never had to use epipen. 2 times tongue felt funny.   Discussed pepcid/claritin/benadryl  Look for sulfite speicalist bobbit did not want to do any testing.   Had to go back to 150mg .

## 2018-11-26 ENCOUNTER — Encounter: Payer: Self-pay | Admitting: Physician Assistant

## 2018-11-26 NOTE — Progress Notes (Signed)
Patient ID: Heidi Carney, female   DOB: 11/02/1965, 53 y.o.   MRN: [id number] .Marland KitchenVirtual Visit via Video Note  I connected with Mashayla Dilone on 11/26/18 at  2:40 PM EDT by a video enabled telemedicine application and verified that I am speaking with the correct person using two identifiers.  Location: Patient: home Provider: clinic   I discussed the limitations of evaluation and management by telemedicine and the availability of in person appointments. The patient expressed understanding and agreed to proceed.  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   She has been having small reactions to sulfite and food with nausea and vomiting off and on but able to manage with diet.   She had another episode last Thursday after eating a cheese cake that had a topping with sulfite in it. She woke up 3am with vomiting and diarrhea and complete fatigue. After 24 hours she feels significantly better. She has been seen by GI and allergist. Allergist reports " he does not test for it". She does have a epi pen but has never had to use it. Her tongue has "tingled" a few times but went away with benadryl.    Her stress/anxiety has been "too much". She stopped tapering effexor and went back to 150mg  dose. She feels much better.   .. Active Ambulatory Problems    Diagnosis Date Noted  . GAD (generalized anxiety disorder) 01/26/2015  . Elevated blood pressure reading 01/26/2015  . Thyroid activity decreased 01/26/2015  . Seborrheic dermatitis 03/10/2015  . Overweight 03/17/2015  . Perimenopausal symptoms 09/13/2015  . Hair loss 11/16/2015  . RLS (restless legs syndrome) 11/16/2015  . B12 deficiency 11/16/2015  . Vitamin D insufficiency 11/16/2015  . Migraine headache without aura 07/10/2016  . Essential hypertension 01/09/2017  . Left lower quadrant pain 01/24/2017  . Slow transit constipation 01/24/2017  . Right  ovarian cyst 01/24/2017  . Change in stool 02/12/2017  . Nausea 07/07/2017  . Intractable cyclical vomiting with nausea 07/07/2017  . Diarrhea 07/07/2017  . Abdominal cramping 07/07/2017  . Allergic reaction 07/07/2017  . Multiple food allergies 07/12/2017  . Bouchard's nodes 12/17/2017  . Joint stiffness 12/17/2017  . Primary osteoarthritis of both hands 12/17/2017  . 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 Colon polyp 12/18/2017  . Grade I internal hemorrhoids 12/18/2017  . Increased pressure in the eye, left 01/02/2018  . Allergic urticaria 02/06/2018  . Angioedema 02/06/2018  . Food intolerance 02/06/2018  . Chronic rhinitis 02/06/2018  . Family history of psoriatic arthritis 04/08/2018  . Family history of morphea 04/08/2018  . 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 Atypical chest pain 01/26/2015  . Benign paroxysmal positional vertigo 01/09/2017   Past Medical History:  Diagnosis Date  . Anxiety   . H/O total vaginal hysterectomy 05/2017  . Hypothyroidism    Reviewed med, allergy, problem list.  Observations/Objective: No acute distress.  No labored breathing.  No swollen lips/tongue/face.  Normal appearance.   .. Today's Vitals   11/25/18 1408  BP: 132/74  Temp: 97.6 F (36.4 C)  TempSrc: Oral  Weight: 168 lb (76.2 kg)  Height: 5\' 4"  (1.626 m)   Body mass index is 28.84 kg/m.    Assessment and Plan: Marland KitchenMarland KitchenTriona was seen today for emesis.  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 In meantime pay special attention to  ingredients in food. Keep benadryl, pepcid and epi pen on hand at all times for allergic reaction. Consider taking zyrtec/clartiin daily for any subtle allergic responses to small amountts of triggers.   Continue with effexor 150mg  daily.   Follow Up Instructions:    I discussed the assessment and treatment plan with the patient. The patient was provided an opportunity to ask questions and all were answered. The patient agreed with the plan and demonstrated an understanding of the instructions.   The patient was advised to call back or seek an in-person evaluation if the symptoms worsen or if the condition fails to improve as anticipated.    Iran Planas, PA-C

## 2018-12-11 ENCOUNTER — Ambulatory Visit: Payer: BC Managed Care – PPO | Admitting: Family Medicine

## 2018-12-11 ENCOUNTER — Encounter: Payer: Self-pay | Admitting: Family Medicine

## 2018-12-11 ENCOUNTER — Other Ambulatory Visit: Payer: Self-pay

## 2018-12-11 ENCOUNTER — Ambulatory Visit (INDEPENDENT_AMBULATORY_CARE_PROVIDER_SITE_OTHER): Payer: BC Managed Care – PPO

## 2018-12-11 VITALS — BP 125/73 | HR 79 | Temp 98.1°F | Wt 173.0 lb

## 2018-12-11 DIAGNOSIS — M25532 Pain in left wrist: Secondary | ICD-10-CM

## 2018-12-11 NOTE — Patient Instructions (Signed)
Thank you for coming in today. Continue the diclofenac gel.  Use the writ brace as needed for comfort especially with activity.  Attend PT.  Recheck in 4 weeks.    Extensor Carpi Ulnaris Tendinitis Rehab Ask your health care provider which exercises are safe for you. Do exercises exactly as told by your health care provider and adjust them as directed. It is normal to feel mild stretching, pulling, tightness, or discomfort as you do these exercises. Stop right away if you feel sudden pain or your pain gets worse. Do not begin these exercises until told by your health care provider. Stretching and range-of-motion exercises These exercises warm up your muscles and joints and improve the movement and flexibility of your forearm. The exercises also help to relieve pain, numbness, and tingling. Wrist extension 1. Stand or sit with your left / right elbow bent to a 90-degree angle (right angle) at your side, with your palm facing the floor. 2. Slowly lift your wrist up so that your fingers move toward the ceiling (extension), stopping when you feel a gentle stretch on the palm side of your forearm. 3. Hold this position for __________ seconds. 4. Return to the starting position. Repeat __________ times. Complete this exercise __________ times a day. Wrist flexion 1. Stand or sit with your left / right elbow bent to a 90-degree angle (right angle) at your side, with your palm facing the floor. 2. Slowly bend your wrist so that your fingers move toward the floor, stopping when you feel a gentle stretch over the back of your forearm. 3. Hold this position for __________ seconds. 4. Return to the starting position. Repeat __________ times. Complete this exercise __________ times a day. Forearm rotation, supination 1. Stand or sit with your left / right elbow bent to a 90-degree angle (right angle) at your side. Position your forearm so that the thumb is facing the ceiling (neutral position). 2. Turn  (rotate) your palm up toward the ceiling (supination), stopping when you feel a gentle stretch. 3. Hold this position for __________ seconds. 4. Return to the starting position. Repeat __________ times. Complete this exercise __________ times a day. Forearm rotation, pronation 1. Stand or sit with your left / right elbow bent to a 90-degree angle (right angle) at your side. Position your forearm so that the thumb is facing the ceiling (neutral position). 2. Turn (rotate) your palm down toward the floor (pronation), stopping when you feel a gentle stretch. 3. Hold this position for __________ seconds. 4. Return to the starting position. Repeat __________ times. Complete this exercise __________ times a day. Wrist extension, assisted 1. Extend your left / right arm in front of you, and point your fingers downward. 2. With your uninjured hand, gently press the back of your left / right hand toward you until you feel a gentle stretch on the top of your forearm and wrist (extension). 3. Hold this position for __________ seconds. 4. Slowly return to the starting position. Repeat __________ times with your elbow straight and __________ times with your elbow bent. Complete this exercises __________ times a day. Wrist flexion, assisted  1. Stand over a tabletop with your left / right hand resting palm-up on the tabletop and with your fingers pointing away from your body. Your arm should be extended, and there should be a slight bend in your elbow. 2. Gently press the back of your hand down onto the table by straightening your elbow (flexion stretch). You should feel a stretch in  the top of your forearm. 3. Hold this position for __________ seconds. 4. Slowly return to the starting position. Repeat __________ times. Complete this exercise __________ times a day. Strengthening exercises These exercises build strength and endurance in your forearm. Endurance is the ability to use your muscles for a long  time, even after they get tired. Wrist extension 1. Sit with your left / right forearm supported on a table and your hand resting palm-down over the edge of the table. 2. Hold a __________ weight in your left / right hand, or hold a rubber exercise band or tube in both hands. If you are holding a band or tube, take up any slack with your other hand so there is a slight tension in the exercise band or tube when you start. 3. Slowly lift your wrist up toward the ceiling (extension). 4. Hold this position for __________ seconds. 5. Slowly lower your hand to the starting position. Repeat __________ times. Complete this exercise __________ times a day. Ulnar deviation 1. Sit with your left / right forearm supported on a table. Your thumb should be pointing upward, and your hand should be able to move down over the table edge. 2. Hold your left / right arm in front of you and hold a rubber exercise band or tube between your hands. There should be a slight tension in the exercise band or tube when you start. 3. Move your wrist so that your pinkie travels toward the floor. Try to only move your wrist and keep the rest of your arm still. 4. Hold this position for __________ seconds. 5. Slowly return your wrist to the starting position. Repeat __________ times. Complete this exercise __________ times a day. Ulnar deviation, eccentric 1. Sit with your left / right forearm supported on a table. Your thumb should be pointing upward, and your hand should be able to move down over the table edge. 2. Hold your uninjured arm in front of you and over your left / right hand. Hold a rubber exercise band or tube between your hands. Do not put any tension on the exercise band or tube yet. 3. Move your left / right wrist so that your pinkie travels toward the floor. 4. Add more tension to the band or tube by pulling it upward with your uninjured hand. 5. Hold this position for __________ seconds. 6. Slowly return to  the starting position, controlling the speed with your left / right hand and wrist (eccentric). Your hand will move toward the ceiling, thumb first. Try to move only your hand and wrist and keep the rest of your arm still. Repeat __________ times. Complete this exercise __________ times a day. Wrist extension, eccentric 1. Sit with your left / right forearm supported on a table. Your palm should be facing the floor, and your hand should be able to move down over the table edge. 2. Hold a __________ weight in the palm of your left / right hand. 3. Using your uninjured hand, lift your left / right wrist up toward the ceiling. 4. Release your left / right hand and begin to very slowly lower it toward the floor (eccentric). 5. When you have gone as far as you can, use your uninjured hand to lift the left / right wrist up toward the ceiling again. Repeat __________ times. Complete this exercise __________ times a day. This information is not intended to replace advice given to you by your health care provider. Make sure you discuss any questions  you have with your health care provider. Document Released: 03/20/2005 Document Revised: 07/16/2018 Document Reviewed: 04/10/2018 Elsevier Patient Education  2020 [employer].

## 2018-12-11 NOTE — Progress Notes (Signed)
Heidi Carney is a 53 y.o. female who presents to [facility] today for left wrist x 3 months.  Patient is left-hand dominant.  She notes pain at the ulnar aspect of her wrist worse with wrist motion and hand motion.  She thinks the pain started when she was doing some weeding a few months ago.  The pain worsened again just recently when her wrist twisted a bit.  She denies any popping or clicking or loss of strength.  No numbness or tingling distally.  She feels well otherwise with no fevers or chills.  She is tried some leftover Voltaren gel last week which helped a little.  Symptoms are moderate.    ROS:  As above  Exam:  BP 125/73   Pulse 79   Temp 98.1 F (36.7 C) (Oral)   Wt 173 lb (78.5 kg)   LMP 01/21/2015   BMI 29.70 kg/m  Wt Readings from Last 5 Encounters:  12/11/18 173 lb (78.5 kg)  11/25/18 168 lb (76.2 kg)  11/11/18 171 lb 3.2 oz (77.7 kg)  10/30/18 168 lb (76.2 kg)  05/31/18 168 lb 3.2 oz (76.3 kg)   General: Well Developed, well nourished, and in no acute distress.  Neuro/Psych: Alert and oriented x3, extra-ocular muscles intact, able to move all 4 extremities, sensation grossly intact. Skin: Warm and dry, no rashes noted.  Respiratory: Not using accessory muscles, speaking in full sentences, trachea midline.  Cardiovascular: Pulses palpable, no extremity edema. Abdomen: Does not appear distended. MSK: Left wrist: Normal-appearing with no significant deformity. Tender palpation along ulnar wrist at dorsal aspect and at lateral aspect of wrist. Normal wrist motion. Intact strength. Pain with resisted wrist extension and ulnar deviation. Pulses cap refill and sensation are intact distally.    Lab and Radiology Results X-ray images left wrist obtained today personally independently reviewed. No significant fractures or severe degenerative changes present. Await for radiology review.  Limited musculoskeletal  ultrasound left ulnar wrist reveals intact extensor carpi ulnaris tendon however hypoechoic fluid surrounds tendon sheath indicating tenosynovitis.  Normal bony structures.  No obvious abnormalities at [facility]. Impression: Extensor carpi ulnaris tendinitis.    Assessment and Plan: 53 y.o. female with left ulnar wrist pain.  Very likely extensor carpi ulnaris tendinitis.  TFCC injury is also a possibility however no obvious tear visible on ultrasound today.  Discussed options.  Plan for home exercise program physical therapy thumb spica splint and diclofenac gel.  Check back in 4 weeks.  Neck step if not improving would likely be injection or MRI.   PDMP not reviewed this encounter. Orders Placed This Encounter  Procedures  . DG Wrist Complete Left    Standing Status:   Future    Number of Occurrences:   1    Standing Expiration Date:   02/10/2020    Order Specific Question:   Reason for Exam (SYMPTOM  OR DIAGNOSIS REQUIRED)    Answer:   eval left ulnar wrist pain    Order Specific Question:   Is patient pregnant?    Answer:   No    Order Specific Question:   Preferred imaging location?    Answer:   [facility]    Order Specific Question:   Radiology Contrast Protocol - do NOT remove file path    Answer:   [email or web address]  . Ambulatory referral to Physical Therapy    Referral Priority:   Routine    Referral Type:   Physical Medicine  Referral Reason:   Specialty Services Required    Requested Specialty:   Physical Therapy   No orders of the defined types were placed in this encounter.   Historical information moved to improve visibility of documentation.  Past Medical History:  Diagnosis Date  . Anxiety   . H/O total vaginal hysterectomy 05/2017  . Hypothyroidism    Past Surgical History:  Procedure Laterality Date  . CESAREAN SECTION  2012  . EYE SURGERY    . LAPAROSCOPIC TOTAL HYSTERECTOMY  05/2017   Social History    Tobacco Use  . Smoking status: Never Smoker  . Smokeless tobacco: Never Used  Substance Use Topics  . Alcohol use: Not Currently   family history includes Arthritis in her sister; Asthma in her sister; Cancer in her father; Diabetes in her father; Glaucoma in her mother; Healthy in her daughter; Psoriasis in her sister; Scleroderma in her sister; Stroke in her father.  Medications: Current Outpatient Medications  Medication Sig Dispense Refill  . cyanocobalamin (,VITAMIN B-12,) 1000 MCG/ML injection Take 76ml once a month. 3 mL 4  . diclofenac sodium (VOLTAREN) 1 % GEL Apply 4 g topically 4 (four) times daily. To affected joint. 100 g 1  . EPINEPHrine (AUVI-Q) 0.3 mg/0.3 mL IJ SOAJ injection Use as directed for severe allergic reactions. 4 Device 1  . Levothyroxine Sodium (TIROSINT) 75 MCG CAPS TAKE 1 CAPSULE (75 MCG TOTAL) BY MOUTH DAILY BEFORE BREAKFAST. 90 capsule 1  . nystatin-triamcinolone (MYCOLOG II) cream Apply 1 application topically as needed.     . triamcinolone ointment (KENALOG) 0.5 % APPLY TOPICALLY AS NEEDED    . venlafaxine XR (EFFEXOR-XR) 150 MG 24 hr capsule Take 1 capsule (150 mg total) by mouth daily with breakfast. 90 capsule 1   No current facility-administered medications for this visit.    Allergies  Allergen Reactions  . Shrimp [Shellfish Allergy]     Lip and tongue swelling.   Sheliah Hatch [Citalopram Hydrobromide]     Made feel anxiety worse  . Effexor [Venlafaxine]     Blurry vision  . Sulfur     Possible reaction with lip tingling/SOB/itching  . Synthroid [Levothyroxine Sodium]     Facial itching      Discussed warning signs or symptoms. Please see discharge instructions. Patient expresses understanding.

## 2018-12-13 ENCOUNTER — Encounter: Payer: Self-pay | Admitting: Rehabilitative and Restorative Service Providers"

## 2018-12-13 ENCOUNTER — Other Ambulatory Visit: Payer: Self-pay

## 2018-12-13 ENCOUNTER — Ambulatory Visit (INDEPENDENT_AMBULATORY_CARE_PROVIDER_SITE_OTHER): Payer: BC Managed Care – PPO | Admitting: Rehabilitative and Restorative Service Providers"

## 2018-12-13 DIAGNOSIS — R29898 Other symptoms and signs involving the musculoskeletal system: Secondary | ICD-10-CM

## 2018-12-13 DIAGNOSIS — M6281 Muscle weakness (generalized): Secondary | ICD-10-CM

## 2018-12-13 DIAGNOSIS — M25532 Pain in left wrist: Secondary | ICD-10-CM | POA: Diagnosis not present

## 2018-12-13 NOTE — Patient Instructions (Signed)
IONTOPHORESIS PATIENT PRECAUTIONS & CONTRAINDICATIONS:  . Redness under one or both electrodes can occur.  This characterized by a uniform redness that usually disappears within 12 hours of treatment. . Small pinhead size blisters may result in response to the drug.  Contact your physician if the problem persists more than 24 hours. . On rare occasions, iontophoresis therapy can result in temporary skin reactions such as rash, inflammation, irritation or burns.  The skin reactions may be the result of individual sensitivity to the ionic solution used, the condition of the skin at the start of treatment, reaction to the materials in the electrodes, allergies or sensitivity to dexamethasone, or a poor connection between the patch and your skin.  Discontinue using iontophoresis if you have any of these reactions and report to your therapist. . Remove the Patch or electrodes if you have any undue sensation of pain or burning during the treatment and report discomfort to your therapist. . Tell your Therapist if you have had known adverse reactions to the application of electrical current. . If using the Patch, the LED light will turn off when treatment is complete and the patch can be removed.  Approximate treatment time is 1-3 hours.  Remove the patch when light goes off or after 6 hours. . The Patch can be worn during normal activity, however excessive motion where the electrodes have been placed can cause poor contact between the skin and the electrode or uneven electrical current resulting in greater risk of skin irritation. Marland Kitchen Keep out of the reach of children.   . DO NOT use if you have a cardiac pacemaker or any other electrically sensitive implanted device. . DO NOT use if you have a known sensitivity to dexamethasone. . DO NOT use during Magnetic Resonance Imaging (MRI). . DO NOT use over broken or compromised skin (e.g. sunburn, cuts, or acne) due to the increased risk of skin reaction. . DO  NOT SHAVE over the area to be treated:  To establish good contact between the Patch and the skin, excessive hair may be clipped. . DO NOT place the Patch or electrodes on or over your eyes, directly over your heart, or brain. . DO NOT reuse the Patch or electrodes as this may cause burns to occur.   TENS UNIT: This is helpful for muscle pain and spasm.   Search and Purchase a TENS 7000 2nd edition at www.tenspros.com. It should be less than $30.     TENS unit instructions: Do not shower or bathe with the unit on Turn the unit off before removing electrodes or batteries If the electrodes lose stickiness add a drop of water to the electrodes after they are disconnected from the unit and place on plastic sheet. If you continued to have difficulty, call the TENS unit company to purchase more electrodes. Do not apply lotion on the skin area prior to use. Make sure the skin is clean and dry as this will help prolong the life of the electrodes. After use, always check skin for unusual red areas, rash or other skin difficulties. If there are any skin problems, does not apply electrodes to the same area. Never remove the electrodes from the unit by pulling the wires. Do not use the TENS unit or electrodes other than as directed. Do not change electrode placement without consultating your therapist or physician. Keep 2 fingers with between each electrode.   Retrograde massage

## 2018-12-13 NOTE — Therapy (Signed)
[place], [place], [address] Phone: [phone]   Fax:  [phone]  Physical Therapy Evaluation  Patient Details  Name: Heidi Carney MRN: [id number] Date of Birth: [birth year] Referring Provider (PT): Dr Lynne Leader    Encounter Date: 12/13/2018  PT End of Session - 12/13/18 0955    Visit Number  1    Number of Visits  6    Date for PT Re-Evaluation  01/24/19    PT Start Time  0845    PT Stop Time  0933    PT Time Calculation (min)  48 min    Activity Tolerance  Patient tolerated treatment well       Past Medical History:  Diagnosis Date  . Anxiety   . H/O total vaginal hysterectomy 05/2017  . Hypothyroidism     Past Surgical History:  Procedure Laterality Date  . CESAREAN SECTION  2012  . EYE SURGERY    . LAPAROSCOPIC TOTAL HYSTERECTOMY  05/2017    There were no vitals filed for this visit.   Subjective Assessment - 12/13/18 0858    Subjective  Patient reports that she was pulling weeds for several hours ~ 3 months ago and developed Lt wrist pain which continued for a few weeks then seemed to subside. Wrist pain incresaed in the past 1-2 weeks with lifting and her daughter twisted hand and symptoms increased significantly.    Pertinent History  arthritis; hysterectomy; c-section    Patient Stated Goals  get rid of pain and prevent recurrent pain    Currently in Pain?  Yes    Pain Score  5     Pain Location  Wrist    Pain Orientation  Left    Pain Descriptors / Indicators  Dull;Sharp   sharp with use   Pain Type  Acute pain    Pain Onset  More than a month ago    Pain Frequency  Constant    Aggravating Factors   use of Lt hand    Pain Relieving Factors  ice; medicine         [facility] PT Assessment - 12/13/18 0001      Assessment   Medical Diagnosis  Lt wrist pain     Referring Provider (PT)  Dr Lynne Leader     Onset Date/Surgical Date  09/02/18   flare up ~ 2 weeks ago     Hand Dominance  Left    Next MD Visit  01/08/2019    Prior Therapy  none       Precautions   Precautions  None      Balance Screen   Has the patient fallen in the past 6 months  No    Has the patient had a decrease in activity level because of a fear of falling?   No    Is the patient reluctant to leave their home because of a fear of falling?   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     Leisure  household chores; yard work       Observation/Other Assessments   Focus on Therapeutic Outcomes (FOTO)   45% limitation       Sensation   Additional Comments  WFL's per pt report       Posture/Postural Control   Posture  Comments  head forward; shoudlers rounded and elevated; scapulae abducted and rotated along the thoracic wall       AROM   Right/Left Shoulder  --   WFL's bilat tight end ranges   Right/Left Elbow  --   WFL's bilat    Right/Left Forearm  --   WFL's bilat tight and painful Lt supination    Right Wrist Extension  70 Degrees    Right Wrist Flexion  53 Degrees    Right Wrist Radial Deviation  22 Degrees    Right Wrist Ulnar Deviation  36 Degrees    Left Wrist Extension  40 Degrees   painful    Left Wrist Flexion  46 Degrees   some pain    Left Wrist Radial Deviation  12 Degrees    Left Wrist Ulnar Deviation  38 Degrees   some pain      Strength   Left Wrist Extension  4/5   painful    Left Wrist Ulnar Deviation  4+/5   some pain    Right Hand Grip (lbs)  70    Left Hand Grip (lbs)  47   some pain      Palpation   Palpation comment  tender to palpation through the Lt ulnar wrist into ulnar forearm in extensor carpi ulnaris                 Objective measurements completed on examination: See above findings.      [facility] PT Treatment/Exercise - 12/13/18 0001      Self-Care   Self-Care  --   ergonomic education for  keyboard/sitting      Iontophoresis   Type of Iontophoresis  Dexamethasone    Location  Lt ulnar wrist     Dose  120 mAmp/ 1 cc    Time  12 hours       Manual Therapy   Manual therapy comments  Lt wrist and ulnar forearm pt sitting     Soft tissue mobilization  gentle work through the ulnar forearm     Other Manual Therapy  retrograde massage Lt hand/wrist/worearm              PT Education - 12/13/18 0941    Education Details  home program; ionto; TENS; taping; POC    Person(s) Educated  Patient    Methods  Explanation    Comprehension  Verbalized understanding          PT Long Term Goals - 12/13/18 1002      PT LONG TERM GOAL #1   Title  Patient to report minimal to no pain Lt wrist with functional activities    Time  6    Period  Weeks    Status  New    Target Date  01/24/19      PT LONG TERM GOAL #2   Title  Increase AROM Lt wrist to equal AROM Rt wrist    Time  6    Period  Weeks    Status  New    Target Date  01/24/19      PT LONG TERM GOAL #3   Title  Increase grip strength Lt wrist to 60-65 pounds without pain    Time  6    Period  Weeks    Status  New    Target Date  01/24/19      PT LONG TERM GOAL #4   Title  Independent in HEP    Time  6    Period  Weeks    Status  New    Target Date  01/24/19      PT LONG TERM GOAL #5   Title  Improve FOTO to </= 32% limitation    Time  6    Period  Weeks    Status  New    Target Date  01/24/19             Plan - 12/13/18 0956    Clinical Impression Statement  Patient presents with Lt wrist pain form recurrent injury ~ 2 weeks ago; initial injury ~ 3 months ago. She has poor posture and alignment; limitedLt wrist ROM and strength; pain with palpation along the ulnar wrist into the extensor carpi ulnaris/ulnar forearm area; pain with functional activities. Patient will benefit from PT to address problems identified.    Stability/Clinical Decision Making  Stable/Uncomplicated    Clinical  Decision Making  Low    Rehab Potential  Good    PT Frequency  1x / week    PT Duration  6 weeks    PT Treatment/Interventions  Patient/family education;ADLs/Self Care Home Management;Cryotherapy;Electrical Stimulation;Iontophoresis 4mg /ml Dexamethasone;Moist Heat;Ultrasound;Manual techniques;Dry needling;Neuromuscular re-education;Therapeutic activities;Therapeutic exercise    PT Next Visit Plan  assess response to ionto and retrograde massage/rest; progress with AROM and gentle PROM if pain is decreased; manual work; ionto; taping; modalities as indicated    Consulted and Agree with Plan of Care  Patient       Patient will benefit from skilled therapeutic intervention in order to improve the following deficits and impairments:  Pain, Increased fascial restricitons, Increased muscle spasms, Decreased strength, Decreased mobility, Decreased range of motion, Impaired UE functional use, Decreased activity tolerance  Visit Diagnosis: Pain in left wrist - Plan: PT plan of care cert/re-cert  Other symptoms and signs involving the musculoskeletal system - Plan: PT plan of care cert/re-cert  Muscle weakness (generalized) - Plan: PT plan of care cert/re-cert     Problem List Patient Active Problem List   Diagnosis Date Noted  . Sjogren's syndrome with keratoconjunctivitis sicca ([place]) 04/30/2018  . Family history of psoriatic arthritis 04/08/2018  . Family history of morphea 04/08/2018  . Allergic urticaria 02/06/2018  . Angioedema 02/06/2018  . Food intolerance 02/06/2018  . Chronic rhinitis 02/06/2018  . Increased pressure in the eye, left 01/02/2018  . Colon polyp 12/18/2017  . Grade I internal hemorrhoids 12/18/2017  . Bouchard's nodes 12/17/2017  . Joint stiffness 12/17/2017  . Primary osteoarthritis of both hands 12/17/2017  . 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 Multiple food allergies 07/12/2017  . Nausea 07/07/2017  . Intractable cyclical vomiting  with nausea 07/07/2017  . Diarrhea 07/07/2017  . Abdominal cramping 07/07/2017  . Allergic reaction 07/07/2017  . Change in stool 02/12/2017  . Left lower quadrant pain 01/24/2017  . Slow transit constipation 01/24/2017  . Right ovarian cyst 01/24/2017  . Essential hypertension 01/09/2017  . Migraine headache without aura 07/10/2016  . Hair loss 11/16/2015  . RLS (restless legs syndrome) 11/16/2015  . B12 deficiency 11/16/2015  . Vitamin D insufficiency 11/16/2015  . Perimenopausal symptoms 09/13/2015  . Overweight 03/17/2015  . Seborrheic dermatitis 03/10/2015  . GAD (generalized anxiety disorder) 01/26/2015  . Elevated blood pressure reading 01/26/2015  . Thyroid activity decreased 01/26/2015    Wanette Robison Nilda Simmer PT, MPH  12/13/2018, 10:06 AM  [place], [place], [address] Phone: [phone]  Fax:  [phone]  Name: Dyanara Bennion MRN: [id number] Date of Birth: November 08, 1965

## 2018-12-14 ENCOUNTER — Other Ambulatory Visit: Payer: Self-pay | Admitting: Physician Assistant

## 2018-12-14 DIAGNOSIS — E039 Hypothyroidism, unspecified: Secondary | ICD-10-CM

## 2018-12-16 ENCOUNTER — Other Ambulatory Visit: Payer: Self-pay | Admitting: Physician Assistant

## 2018-12-16 DIAGNOSIS — M25562 Pain in left knee: Secondary | ICD-10-CM

## 2018-12-20 ENCOUNTER — Ambulatory Visit (INDEPENDENT_AMBULATORY_CARE_PROVIDER_SITE_OTHER): Payer: BC Managed Care – PPO | Admitting: Rehabilitative and Restorative Service Providers"

## 2018-12-20 ENCOUNTER — Encounter: Payer: Self-pay | Admitting: Rehabilitative and Restorative Service Providers"

## 2018-12-20 DIAGNOSIS — M25532 Pain in left wrist: Secondary | ICD-10-CM | POA: Diagnosis not present

## 2018-12-20 DIAGNOSIS — R29898 Other symptoms and signs involving the musculoskeletal system: Secondary | ICD-10-CM

## 2018-12-20 DIAGNOSIS — M6281 Muscle weakness (generalized): Secondary | ICD-10-CM | POA: Diagnosis not present

## 2018-12-20 NOTE — Patient Instructions (Signed)
Access Code: [email or web address]  URL: [email or web address]  Date: 12/20/2018  Prepared by: Gillermo Murdoch   Exercises  Seated Wrist Extension PROM - 5 reps - 1 sets - 10sec hold - 2-3x daily - 7x weekly  Seated Wrist Flexion Stretch - 5 reps - 1 sets - 10sec hold - 2-3x daily - 7x weekly  Seated Wrist Radial Deviation PROM - 5 reps - 1 sets - 10 sec hold - 2-3x daily - 7x weekly  Wrist Circumduction AROM - 5-10 reps - 1 sets - 2-3x daily - 7x weekly  Seated Gripping Towel - 5-10 reps - 1 sets - 2-3x daily - 7x weekly

## 2018-12-20 NOTE — Therapy (Addendum)
[place], [place], [address] Phone: [phone]   Fax:  [phone]  Physical Therapy Treatment  Patient Details  Name: Heidi Carney MRN: [id number] Date of Birth: 19-Oct-1965 Referring Provider (PT): Dr Lynne Leader    Encounter Date: 12/20/2018  PT End of Session - 12/20/18 1020    Visit Number  2    Number of Visits  6    Date for PT Re-Evaluation  01/24/19    PT Start Time  1018    PT Stop Time  1103    PT Time Calculation (min)  45 min    Activity Tolerance  Patient tolerated treatment well       Past Medical History:  Diagnosis Date  . Anxiety   . H/O total vaginal hysterectomy 05/2017  . Hypothyroidism     Past Surgical History:  Procedure Laterality Date  . CESAREAN SECTION  2012  . EYE SURGERY    . LAPAROSCOPIC TOTAL HYSTERECTOMY  05/2017    There were no vitals filed for this visit.  Subjective Assessment - 12/20/18 1021    Subjective  Patient reports that wrist is feeling better and mofving better. She continues to have some soreness at resst and wrist with hurt when she overuses it. has a brace to try.    Currently in Pain?  Yes    Pain Score  2     Pain Location  Wrist    Pain Orientation  Left    Pain Descriptors / Indicators  Sore    Pain Type  Acute pain         [facility] PT Assessment - 12/20/18 0001      Assessment   Medical Diagnosis  Lt wrist pain     Referring Provider (PT)  Dr Lynne Leader     Onset Date/Surgical Date  09/02/18   flare up ~ 2 weeks ago    Hand Dominance  Left    Next MD Visit  01/08/2019    Prior Therapy  none       Observation/Other Assessments   Focus on Therapeutic Outcomes (FOTO)   40% limitation       AROM   Left Wrist Extension  44 Degrees   painful    Left Wrist Flexion  46 Degrees   sore    Left Wrist Ulnar Deviation  38 Degrees   sore      Palpation   Palpation comment  tender to palpation through the Lt ulnar wrist into  ulnar forearm in extensor carpi ulnaris                                 PT Long Term Goals - 12/13/18 1002      PT LONG TERM GOAL #1   Title  Patient to report minimal to no pain Lt wrist with functional activities    Time  6    Period  Weeks    Status  New    Target Date  01/24/19      PT LONG TERM GOAL #2   Title  Increase AROM Lt wrist to equal AROM Rt wrist    Time  6    Period  Weeks    Status  New    Target Date  01/24/19      PT LONG TERM GOAL #3   Title  Increase grip strength Lt wrist to 60-65  pounds without pain    Time  6    Period  Weeks    Status  New    Target Date  01/24/19      PT LONG TERM GOAL #4   Title  Independent in HEP    Time  6    Period  Weeks    Status  New    Target Date  01/24/19      PT LONG TERM GOAL #5   Title  Improve FOTO to </= 32% limitation    Time  6    Period  Weeks    Status  New    Target Date  01/24/19            Plan - 12/20/18 1021    Clinical Impression Statement  Patient reports good improvement in wrist pain and mobility. She has a splint for Lt wrist which seems to help support wrist. She demonstrates improvement in Lt wrist ROM with decreased pain as well as decreased palpable tenderness. She wants to continue with independent management of rehab. Instructed in progression of exercises.    Stability/Clinical Decision Making  Stable/Uncomplicated    Rehab Potential  Good    PT Frequency  1x / week    PT Duration  6 weeks    PT Treatment/Interventions  Patient/family education;ADLs/Self Care Home Management;Cryotherapy;Electrical Stimulation;Iontophoresis '4mg'$ /ml Dexamethasone;Moist Heat;Ultrasound;Manual techniques;Dry needling;Neuromuscular re-education;Therapeutic activities;Therapeutic exercise    PT Next Visit Plan  patient will be placed on hold with HEP and instructions for progressing exercises - she will call with any questions or problems    PT Home Exercise Plan  [email or web address]     Consulted and Agree with Plan of Care  Patient       Patient will benefit from skilled therapeutic intervention in order to improve the following deficits and impairments:  Pain, Increased fascial restricitons, Increased muscle spasms, Decreased strength, Decreased mobility, Decreased range of motion, Impaired UE functional use, Decreased activity tolerance  Visit Diagnosis: Pain in left wrist  Other symptoms and signs involving the musculoskeletal system  Muscle weakness (generalized)     Problem List Patient Active Problem List   Diagnosis Date Noted  . Sjogren's syndrome with keratoconjunctivitis sicca ([place]) 04/30/2018  . Family history of psoriatic arthritis 04/08/2018  . Family history of morphea 04/08/2018  . Allergic urticaria 02/06/2018  . Angioedema 02/06/2018  . Food intolerance 02/06/2018  . Chronic rhinitis 02/06/2018  . Increased pressure in the eye, left 01/02/2018  . Colon polyp 12/18/2017  . Grade I internal hemorrhoids 12/18/2017  . Bouchard's nodes 12/17/2017  . Joint stiffness 12/17/2017  . Primary osteoarthritis of both hands 12/17/2017  . 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 Multiple food allergies 07/12/2017  . Nausea 07/07/2017  . Intractable cyclical vomiting with nausea 07/07/2017  . Diarrhea 07/07/2017  . Abdominal cramping 07/07/2017  . Allergic reaction 07/07/2017  . Change in stool 02/12/2017  . Left lower quadrant pain 01/24/2017  . Slow transit constipation 01/24/2017  . Right ovarian cyst 01/24/2017  . Essential hypertension 01/09/2017  . Migraine headache without aura 07/10/2016  . Hair loss 11/16/2015  . RLS (restless legs syndrome) 11/16/2015  . B12 deficiency 11/16/2015  . Vitamin D insufficiency 11/16/2015  . Perimenopausal symptoms 09/13/2015  . Overweight 03/17/2015  . Seborrheic dermatitis 03/10/2015  . GAD (generalized anxiety disorder) 01/26/2015  . Elevated blood pressure reading 01/26/2015  .  Thyroid activity decreased 01/26/2015    Heidi Carney Nilda Simmer PT, MPH  12/20/2018, 12:14 PM  [facility] [place], [place], [address] Phone: [phone]   Fax:  [phone]  Name: Heidi Carney MRN: [id number] Date of Birth: 1965-04-12  PHYSICAL THERAPY DISCHARGE SUMMARY  Visits from Start of Care: 2  Current functional level related to goals / functional outcomes: See progress note for discharge status    Remaining deficits: Should continue with independent HEP    Education / Equipment: HEP  Plan: Patient agrees to discharge.  Patient goals were met. Patient is being discharged due to meeting the stated rehab goals.  ?????     Heidi Carney P. Helene Kelp PT, MPH 05/23/19 10:35 AM

## 2018-12-25 ENCOUNTER — Other Ambulatory Visit: Payer: Self-pay | Admitting: Physician Assistant

## 2018-12-25 DIAGNOSIS — F411 Generalized anxiety disorder: Secondary | ICD-10-CM

## 2018-12-25 DIAGNOSIS — F439 Reaction to severe stress, unspecified: Secondary | ICD-10-CM

## 2019-01-08 ENCOUNTER — Ambulatory Visit (INDEPENDENT_AMBULATORY_CARE_PROVIDER_SITE_OTHER): Payer: BC Managed Care – PPO | Admitting: Family Medicine

## 2019-01-08 ENCOUNTER — Encounter: Payer: Self-pay | Admitting: Family Medicine

## 2019-01-08 ENCOUNTER — Other Ambulatory Visit: Payer: Self-pay

## 2019-01-08 VITALS — BP 149/88 | HR 80 | Wt 169.0 lb

## 2019-01-08 DIAGNOSIS — M25532 Pain in left wrist: Secondary | ICD-10-CM

## 2019-01-08 DIAGNOSIS — Z23 Encounter for immunization: Secondary | ICD-10-CM | POA: Diagnosis not present

## 2019-01-08 NOTE — Progress Notes (Signed)
Heidi Carney is a 53 y.o. female who presents to [facility] today for left wrist pain.    I saw Heidi Carney about 1 month ago on September 9 for left wrist pain.  At that time she had been having pain for 3 months.  She has hand and wrist pain is worse with motion.  She had reinjured it a bit with a twisting motion with weeding in her yard.  I thought the pain was very likely extensor carpi ulnaris tendinitis with possible TFCC injury.  Plan for physical therapy thumb spica splint as needed and diclofenac gel.  She had 2 sessions of physical therapy and as of her last session on the 18th she had had some improvement.  She notes continued mild pain at the ulnar aspect of the wrist but overall is much better than last time.  ROS:  As above  Exam:  BP (!) 149/88   Pulse 80   Wt 169 lb (76.7 kg)   LMP 01/21/2015   BMI 29.01 kg/m  Wt Readings from Last 5 Encounters:  01/08/19 169 lb (76.7 kg)  12/11/18 173 lb (78.5 kg)  11/25/18 168 lb (76.2 kg)  11/11/18 171 lb 3.2 oz (77.7 kg)  10/30/18 168 lb (76.2 kg)   General: Well Developed, well nourished, and in no acute distress.  Neuro/Psych: Alert and oriented x3, extra-ocular muscles intact, able to move all 4 extremities, sensation grossly intact. Skin: Warm and dry, no rashes noted.  Respiratory: Not using accessory muscles, speaking in full sentences, trachea midline.  Cardiovascular: Pulses palpable, no extremity edema. Abdomen: Does not appear distended. MSK: Left wrist normal-appearing with no swelling.  Mildly tender to palpation at ulnar wrist. Normal motion.  Some pain to resisted ulnar deviation however strength is intact.  Pulses cap refill and sensation are intact distally.       Assessment and Plan: 53 y.o. female with left ulnar wrist pain.  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  Plan for continued home exercise program and diclofenac gel.  If not improving  next step would likely be injection.  Recheck as needed.  Egg free flu vaccine given today prior to discharge.   PDMP not reviewed this encounter. Orders Placed This Encounter  Procedures  . Flu Vaccine MDCK QUAD PF   No orders of the defined types were placed in this encounter.   Historical information moved to improve visibility of documentation.  Past Medical History:  Diagnosis Date  . Anxiety   . H/O total vaginal hysterectomy 05/2017  . Hypothyroidism    Past Surgical History:  Procedure Laterality Date  . CESAREAN SECTION  2012  . EYE SURGERY    . LAPAROSCOPIC TOTAL HYSTERECTOMY  05/2017   Social History   Tobacco Use  . Smoking status: Never Smoker  . Smokeless tobacco: Never Used  Substance Use Topics  . Alcohol use: Not Currently   family history includes Arthritis in her sister; Asthma in her sister; Cancer in her father; Diabetes in her father; Glaucoma in her mother; Healthy in her daughter; Psoriasis in her sister; Scleroderma in her sister; Stroke in her father.  Medications: Current Outpatient Medications  Medication Sig Dispense Refill  . cyanocobalamin (,VITAMIN B-12,) 1000 MCG/ML injection Take 59ml once a month. 3 mL 4  . diclofenac sodium (VOLTAREN) 1 % GEL Apply 4 g topically 4 (four) times daily. To affected joint. 100 g 1  . EPINEPHrine (AUVI-Q) 0.3 mg/0.3 mL IJ SOAJ injection  Use as directed for severe allergic reactions. 4 Device 1  . Levothyroxine Sodium (TIROSINT) 75 MCG CAPS TAKE 1 CAPSULE (75 MCG TOTAL) BY MOUTH DAILY BEFORE BREAKFAST. 90 capsule 1  . nystatin-triamcinolone (MYCOLOG II) cream Apply 1 application topically as needed.     . triamcinolone ointment (KENALOG) 0.5 % APPLY TOPICALLY AS NEEDED    . venlafaxine XR (EFFEXOR-XR) 150 MG 24 hr capsule TAKE 1 CAPSULE (150 MG TOTAL) BY MOUTH DAILY WITH BREAKFAST. 90 capsule 1   No current facility-administered medications for this visit.    Allergies  Allergen Reactions  . Shrimp  [Shellfish Allergy]     Lip and tongue swelling.   Sheliah Hatch [Citalopram Hydrobromide]     Made feel anxiety worse  . Effexor [Venlafaxine]     Blurry vision  . Sulfur     Possible reaction with lip tingling/SOB/itching  . Synthroid [Levothyroxine Sodium]     Facial itching      Discussed warning signs or symptoms. Please see discharge instructions. Patient expresses understanding.

## 2019-01-08 NOTE — Patient Instructions (Signed)
Thank you for coming in today. Do the band exercises.  Go from short position to long position with some resistance.  About 30 reps 2x daily  Use diclofenac gel as needed.   If not better let me know.

## 2019-01-09 ENCOUNTER — Other Ambulatory Visit: Payer: Self-pay | Admitting: Physician Assistant

## 2019-01-09 DIAGNOSIS — E039 Hypothyroidism, unspecified: Secondary | ICD-10-CM

## 2019-01-20 ENCOUNTER — Other Ambulatory Visit: Payer: Self-pay | Admitting: Physician Assistant

## 2019-01-20 DIAGNOSIS — E039 Hypothyroidism, unspecified: Secondary | ICD-10-CM

## 2019-01-22 ENCOUNTER — Encounter: Payer: Self-pay | Admitting: Physician Assistant

## 2019-01-22 ENCOUNTER — Other Ambulatory Visit: Payer: Self-pay | Admitting: Physician Assistant

## 2019-01-22 DIAGNOSIS — E039 Hypothyroidism, unspecified: Secondary | ICD-10-CM

## 2019-01-24 ENCOUNTER — Other Ambulatory Visit: Payer: Self-pay | Admitting: Physician Assistant

## 2019-01-24 DIAGNOSIS — F411 Generalized anxiety disorder: Secondary | ICD-10-CM

## 2019-03-21 ENCOUNTER — Encounter: Payer: Self-pay | Admitting: Physician Assistant

## 2019-03-21 DIAGNOSIS — E039 Hypothyroidism, unspecified: Secondary | ICD-10-CM

## 2019-03-24 MED ORDER — LEVOTHYROXINE SODIUM 75 MCG PO CAPS: ORAL_CAPSULE | ORAL | 1 refills | Status: DC

## 2019-03-25 NOTE — Telephone Encounter (Signed)
Received a fax from [employer] that [facility] has been approved from 03/24/2019 through 03/23/2020. Pharmacy aware and form sent to scan.

## 2019-04-07 ENCOUNTER — Other Ambulatory Visit: Payer: Self-pay

## 2019-04-09 ENCOUNTER — Other Ambulatory Visit: Payer: Self-pay

## 2019-04-09 NOTE — Telephone Encounter (Signed)
levocetirizine 5 mg denied at [facility], patient needs an OV.

## 2019-04-14 ENCOUNTER — Encounter: Payer: Self-pay | Admitting: Physician Assistant

## 2019-04-15 NOTE — Telephone Encounter (Signed)
Will you send the ingredients to both vaccines to patient.

## 2019-04-16 ENCOUNTER — Encounter: Payer: Self-pay | Admitting: Rheumatology

## 2019-05-02 NOTE — Progress Notes (Signed)
Virtual Visit via Video Note  I connected with Heidi Carney on 05/12/19 at  9:15 AM EST by a video enabled telemedicine application and verified that I am speaking with the correct person using two identifiers.  Location: Patient: Home  Provider: Clinic  This service was conducted via virtual visit.  Both audio and visual tools were used.  The patient was located at home. I was located in my office.  Consent was obtained prior to the virtual visit and is aware of possible charges through their insurance for this visit.  The patient is an established patient.  Dr. Estanislado Pandy, MD conducted the virtual visit and Hazel Sams, PA-C acted as scribe during the service.  Office staff helped with scheduling follow up visits after the service was conducted.     I discussed the limitations of evaluation and management by telemedicine and the availability of in person appointments. The patient expressed understanding and agreed to proceed.  CC: pain in both hands History of Present Illness: Patient is a 54 year old female with a past medical history of Sjogren's and osteoarthritis. She experiences stiffness in both hands in the morning.  She has occasional pain and stiffness in the left knee joint.  She states the left knee joint pain is worse after activity.  She denies any joint swelling. She uses voltaren gel topically as needed for pain relief.  Her sicca symptoms have been manageable. She tries to drink lots of fluids.   Review of Systems  Constitutional: Positive for malaise/fatigue. Negative for fever.  HENT: Negative for congestion.        +Mouth dryness   Eyes: Negative for photophobia, pain, discharge and redness.       +Eye dryness   Respiratory: Negative for cough, shortness of breath and wheezing.   Cardiovascular: Negative for chest pain, palpitations and leg swelling.  Gastrointestinal: Positive for constipation. Negative for blood in stool and diarrhea.  Genitourinary: Negative for  dysuria and frequency.  Musculoskeletal: Positive for joint pain. Negative for back pain, myalgias and neck pain.  Skin: Negative for rash.  Neurological: Negative for dizziness, weakness and headaches.  Endo/Heme/Allergies: Does not bruise/bleed easily.  Psychiatric/Behavioral: Negative for depression and memory loss. The patient is not nervous/anxious and does not have insomnia.       Observations/Objective: Physical Exam  Constitutional: She is oriented to person, place, and time and well-developed, well-nourished, and in no distress.  HENT:  Head: Normocephalic and atraumatic.  Eyes: Conjunctivae are normal.  Pulmonary/Chest: Effort normal.  Neurological: She is alert and oriented to person, place, and time.  Psychiatric: Mood, memory, affect and judgment normal.   Patient reports morning stiffness for 5 minutes.   Patient denies nocturnal pain.  Difficulty dressing/grooming: Denies Difficulty climbing stairs: Denies Difficulty getting out of chair: Denies Difficulty using hands for taps, buttons, cutlery, and/or writing: Denies   Assessment and Plan: Visit Diagnoses: Sjogren's syndrome with keratoconjunctivitis sicca (HCC) - Sicca symptoms with positive ANA and positive Ro:  She experiences intermittent sicca symptoms. She uses OTC products and drinks lots of fluids for symptomatic relief.  She has bouts of increased fatigue and arthralgias about 1-2 times per month. She is not having any joint inflammation at this time.  Her level of fatigue improves with exercise. We discussed starting her on plaquenil but she declined at this time. Indications, contraindications, and potential side effects of PLQ were discussed.  All questions were addressed but she does not want to start at this time. We  will check routine lab work. Future orders were placed today.  She was advised to notify us if she develops any new or worsening symptoms.  She will follow up in 6 months.   Primary  osteoarthritis of both hands: She has intermittent discomfort in both hands but no joint swelling. She has morning stiffness in both hands lasting about 5 minutes daily.  Joint protection and muscle strengthening were discussed.    Chronic pain in left knee-She has occasional discomfort in the left knee joint. She is not having any joint swelling at this time. She denies any mechanical symptoms.  Her discomfort is worse during and after more strenuous activities.  She has no difficulty climbing steps or getting up from a chair.   Chronic midline low back pain without sciatica -She is not having any lower back pain at this time.  No symptoms of sciatica.   Vitamin D insufficiency -She is not taking a vitamin D supplement at this time.   Other medical conditions are listed as follows:   Seborrheic dermatitis   Increased pressure in the eye, left   Essential hypertension -blood pressure is controlled.  Migraine without aura and without status migrainosus, not intractable -   GAD (generalized anxiety disorder)   RLS (restless legs syndrome)   Family history of psoriatic arthritis - sister   Family history of morphea - sister   Follow Up Instructions: She will follow up in 6 months.    I discussed the assessment and treatment plan with the patient. The patient was provided an opportunity to ask questions and all were answered. The patient agreed with the plan and demonstrated an understanding of the instructions.   The patient was advised to call back or seek an in-person evaluation if the symptoms worsen or if the condition fails to improve as anticipated.  I provided 30 minutes of non-face-to-face time during this encounter.   Bo Merino, MD   Scribed by-  Hazel Sams, PA-C

## 2019-05-12 ENCOUNTER — Encounter: Payer: Self-pay | Admitting: Rheumatology

## 2019-05-12 ENCOUNTER — Other Ambulatory Visit: Payer: Self-pay

## 2019-05-12 ENCOUNTER — Telehealth (INDEPENDENT_AMBULATORY_CARE_PROVIDER_SITE_OTHER): Payer: BC Managed Care – PPO | Admitting: Rheumatology

## 2019-05-12 DIAGNOSIS — M3501 Sicca syndrome with keratoconjunctivitis: Secondary | ICD-10-CM

## 2019-05-12 DIAGNOSIS — M545 Low back pain: Secondary | ICD-10-CM

## 2019-05-12 DIAGNOSIS — E559 Vitamin D deficiency, unspecified: Secondary | ICD-10-CM

## 2019-05-12 DIAGNOSIS — M19041 Primary osteoarthritis, right hand: Secondary | ICD-10-CM

## 2019-05-12 DIAGNOSIS — Z8261 Family history of arthritis: Secondary | ICD-10-CM

## 2019-05-12 DIAGNOSIS — F411 Generalized anxiety disorder: Secondary | ICD-10-CM

## 2019-05-12 DIAGNOSIS — Z8669 Personal history of other diseases of the nervous system and sense organs: Secondary | ICD-10-CM

## 2019-05-12 DIAGNOSIS — I10 Essential (primary) hypertension: Secondary | ICD-10-CM

## 2019-05-12 DIAGNOSIS — Z8659 Personal history of other mental and behavioral disorders: Secondary | ICD-10-CM

## 2019-05-12 DIAGNOSIS — H40052 Ocular hypertension, left eye: Secondary | ICD-10-CM

## 2019-05-12 DIAGNOSIS — Z840 Family history of diseases of the skin and subcutaneous tissue: Secondary | ICD-10-CM

## 2019-05-12 DIAGNOSIS — R5383 Other fatigue: Secondary | ICD-10-CM

## 2019-05-12 DIAGNOSIS — G2581 Restless legs syndrome: Secondary | ICD-10-CM

## 2019-05-12 DIAGNOSIS — G43009 Migraine without aura, not intractable, without status migrainosus: Secondary | ICD-10-CM

## 2019-05-12 DIAGNOSIS — E538 Deficiency of other specified B group vitamins: Secondary | ICD-10-CM

## 2019-05-12 DIAGNOSIS — L500 Allergic urticaria: Secondary | ICD-10-CM

## 2019-05-12 DIAGNOSIS — G8929 Other chronic pain: Secondary | ICD-10-CM

## 2019-05-12 DIAGNOSIS — Z8639 Personal history of other endocrine, nutritional and metabolic disease: Secondary | ICD-10-CM

## 2019-05-12 DIAGNOSIS — M19042 Primary osteoarthritis, left hand: Secondary | ICD-10-CM

## 2019-05-12 DIAGNOSIS — L219 Seborrheic dermatitis, unspecified: Secondary | ICD-10-CM

## 2019-05-13 ENCOUNTER — Other Ambulatory Visit: Payer: Self-pay | Admitting: *Deleted

## 2019-05-13 DIAGNOSIS — R5383 Other fatigue: Secondary | ICD-10-CM

## 2019-05-13 DIAGNOSIS — E559 Vitamin D deficiency, unspecified: Secondary | ICD-10-CM

## 2019-05-13 DIAGNOSIS — M3501 Sicca syndrome with keratoconjunctivitis: Secondary | ICD-10-CM

## 2019-05-14 ENCOUNTER — Telehealth: Payer: Self-pay | Admitting: *Deleted

## 2019-05-14 DIAGNOSIS — E559 Vitamin D deficiency, unspecified: Secondary | ICD-10-CM

## 2019-05-14 MED ORDER — VITAMIN D (ERGOCALCIFEROL) 1.25 MG (50000 UNIT) PO CAPS: 50000.0000 [IU] | ORAL_CAPSULE | ORAL | 0 refills | Status: DC

## 2019-05-14 NOTE — Telephone Encounter (Signed)
-----   Message from Ofilia Neas, PA-C sent at 05/14/2019  8:49 AM EST ----- Vitamin D is low-16.  Please notify patient and send in vitamin D 50,000 units by mouth twice weekly for 3 months.  Will recheck vitamin D in 3 months.

## 2019-05-14 NOTE — Progress Notes (Signed)
Vitamin D is low-16.  Please notify patient and send in vitamin D 50,000 units by mouth twice weekly for 3 months.  Will recheck vitamin D in 3 months.

## 2019-05-15 ENCOUNTER — Telehealth: Payer: Self-pay | Admitting: Neurology

## 2019-05-15 LAB — COMPLETE METABOLIC PANEL WITH GFR
AG Ratio: 1.3 (calc) (ref 1.0–2.5)
ALT: 19 U/L (ref 6–29)
AST: 20 U/L (ref 10–35)
Albumin: 4.4 g/dL (ref 3.6–5.1)
Alkaline phosphatase (APISO): 80 U/L (ref 37–153)
BUN: 11 mg/dL (ref 7–25)
CO2: 32 mmol/L (ref 20–32)
Calcium: 9.5 mg/dL (ref 8.6–10.4)
Chloride: 103 mmol/L (ref 98–110)
Creat: 0.91 mg/dL (ref 0.50–1.05)
GFR, Est African American: 85 mL/min/{1.73_m2} (ref >=60)
GFR, Est Non African American: 73 mL/min/{1.73_m2} (ref >=60)
Globulin: 3.3 g/dL (calc) (ref 1.9–3.7)
Glucose, Bld: 98 mg/dL (ref 65–99)
Potassium: 4.2 mmol/L (ref 3.5–5.3)
Sodium: 141 mmol/L (ref 135–146)
Total Bilirubin: 0.3 mg/dL (ref 0.2–1.2)
Total Protein: 7.7 g/dL (ref 6.1–8.1)

## 2019-05-15 LAB — URINALYSIS, ROUTINE W REFLEX MICROSCOPIC
Bacteria, UA: NONE SEEN /HPF
Bilirubin Urine: NEGATIVE
Glucose, UA: NEGATIVE
Hgb urine dipstick: NEGATIVE
Hyaline Cast: NONE SEEN /LPF
Ketones, ur: NEGATIVE
Nitrite: NEGATIVE
Protein, ur: NEGATIVE
RBC / HPF: NONE SEEN /HPF (ref 0–2)
Specific Gravity, Urine: 1.005 (ref 1.001–1.03)
Squamous Epithelial / HPF: NONE SEEN /HPF (ref <=5)
pH: 7 (ref 5.0–8.0)

## 2019-05-15 LAB — ANTI-NUCLEAR AB-TITER (ANA TITER)
ANA TITER: 1:40 {titer} — ABNORMAL HIGH
ANA Titer 1: 1:320 {titer} — ABNORMAL HIGH

## 2019-05-15 LAB — VITAMIN D 25 HYDROXY (VIT D DEFICIENCY, FRACTURES): Vit D, 25-Hydroxy: 16 ng/mL — ABNORMAL LOW (ref 30–100)

## 2019-05-15 LAB — CBC WITH DIFFERENTIAL/PLATELET
Absolute Monocytes: 532 cells/uL (ref 200–950)
Basophils Absolute: 28 cells/uL (ref 0–200)
Basophils Relative: 0.5 %
Eosinophils Absolute: 263 cells/uL (ref 15–500)
Eosinophils Relative: 4.7 %
HCT: 41.9 % (ref 35.0–45.0)
Hemoglobin: 14.5 g/dL (ref 11.7–15.5)
Lymphs Abs: 2369 cells/uL (ref 850–3900)
MCH: 35.3 pg — ABNORMAL HIGH (ref 27.0–33.0)
MCHC: 34.6 g/dL (ref 32.0–36.0)
MCV: 101.9 fL — ABNORMAL HIGH (ref 80.0–100.0)
MPV: 10.2 fL (ref 7.5–12.5)
Monocytes Relative: 9.5 %
Neutro Abs: 2408 cells/uL (ref 1500–7800)
Neutrophils Relative %: 43 %
Platelets: 178 10*3/uL (ref 140–400)
RBC: 4.11 10*6/uL (ref 3.80–5.10)
RDW: 11.8 % (ref 11.0–15.0)
Total Lymphocyte: 42.3 %
WBC: 5.6 10*3/uL (ref 3.8–10.8)

## 2019-05-15 LAB — PROTEIN ELECTROPHORESIS, SERUM, WITH REFLEX
Albumin ELP: 4.1 g/dL (ref 3.8–4.8)
Alpha 1: 0.2 g/dL (ref 0.2–0.3)
Alpha 2: 0.6 g/dL (ref 0.5–0.9)
Beta 2: 0.5 g/dL (ref 0.2–0.5)
Beta Globulin: 0.5 g/dL (ref 0.4–0.6)
Gamma Globulin: 1.3 g/dL (ref 0.8–1.7)
Total Protein: 7.3 g/dL (ref 6.1–8.1)

## 2019-05-15 LAB — ANA: Anti Nuclear Antibody (ANA): POSITIVE — AB

## 2019-05-15 LAB — SJOGRENS SYNDROME-A EXTRACTABLE NUCLEAR ANTIBODY: SSA (Ro) (ENA) Antibody, IgG: >8 AI — AB

## 2019-05-15 LAB — RHEUMATOID FACTOR: Rheumatoid fact SerPl-aCnc: <14 IU/mL (ref <14)

## 2019-05-15 LAB — SJOGRENS SYNDROME-B EXTRACTABLE NUCLEAR ANTIBODY: SSB (La) (ENA) Antibody, IgG: <1 AI

## 2019-05-15 NOTE — Progress Notes (Signed)
I called the patient to discuss lab work in detail.  All questions were addressed.

## 2019-05-15 NOTE — Progress Notes (Signed)
CMP WNL.  CBC stable.  UA revealed 1+ leukocytes.  Negative nitrites.  If she is experiencing symptoms of a UTI please advise pt to follow up with PCP for further evaluation.   ANA and Ro antibody remain positive.  La negative. SPEP WNL.  RF negative.

## 2019-05-15 NOTE — Telephone Encounter (Signed)
Patient left vm stating she just had labs through rheumatology (available in [place]) and it showed signs of UTI. She did have weak stream a couple weeks ago and having urgency and fatigue. Please advise.

## 2019-05-16 ENCOUNTER — Telehealth (INDEPENDENT_AMBULATORY_CARE_PROVIDER_SITE_OTHER): Payer: BC Managed Care – PPO | Admitting: Physician Assistant

## 2019-05-16 ENCOUNTER — Encounter: Payer: Self-pay | Admitting: Physician Assistant

## 2019-05-16 VITALS — BP 129/82 | HR 77 | Temp 97.6°F | Ht 64.0 in | Wt 169.0 lb

## 2019-05-16 DIAGNOSIS — R1030 Lower abdominal pain, unspecified: Secondary | ICD-10-CM

## 2019-05-16 DIAGNOSIS — E039 Hypothyroidism, unspecified: Secondary | ICD-10-CM

## 2019-05-16 DIAGNOSIS — R300 Dysuria: Secondary | ICD-10-CM | POA: Diagnosis not present

## 2019-05-16 DIAGNOSIS — J310 Chronic rhinitis: Secondary | ICD-10-CM | POA: Diagnosis not present

## 2019-05-16 DIAGNOSIS — L500 Allergic urticaria: Secondary | ICD-10-CM

## 2019-05-16 DIAGNOSIS — R82998 Other abnormal findings in urine: Secondary | ICD-10-CM

## 2019-05-16 MED ORDER — PHENAZOPYRIDINE HCL 200 MG PO TABS: 200.0000 mg | ORAL_TABLET | Freq: Three times a day (TID) | ORAL | 0 refills | Status: AC

## 2019-05-16 MED ORDER — NITROFURANTOIN MONOHYD MACRO 100 MG PO CAPS: 100.0000 mg | ORAL_CAPSULE | Freq: Two times a day (BID) | ORAL | 0 refills | Status: DC

## 2019-05-16 MED ORDER — LEVOCETIRIZINE DIHYDROCHLORIDE 5 MG PO TABS: 5.0000 mg | ORAL_TABLET | Freq: Every day | ORAL | 3 refills | Status: DC

## 2019-05-16 MED ORDER — LEVOTHYROXINE SODIUM 75 MCG PO CAPS: ORAL_CAPSULE | ORAL | 1 refills | Status: DC

## 2019-05-16 NOTE — Progress Notes (Signed)
Patient saw rheumatology and had labs 05/13/2019. They were concerned about UTI. She has had some urgency and fatigue. Also weak stream couple weeks ago and last night.  Requesting refill on Xyzal.    Recent Results (from the past 2160 hour(s))  VITAMIN D 25 Hydroxy (Vit-D Deficiency, Fractures)     Status: Abnormal   Collection Time: 05/13/19  3:39 PM  Result Value Ref Range   Vit D, 25-Hydroxy 16 (L) 30 - 100 ng/mL    Comment: Vitamin D Status         25-OH Vitamin D: . Deficiency:                    <20 ng/mL Insufficiency:             20 - 29 ng/mL Optimal:                 > or = 30 ng/mL . For 25-OH Vitamin D testing on patients on  D2-supplementation and patients for whom quantitation  of D2 and D3 fractions is required, the [facility](TM) 25-OH VIT D, (D2,D3), LC/MS/MS is recommended: order  code [id number] (patients >81yrs). See Note 1 . Note 1 . For additional information, please refer to  http://education.[facility].com/faq/FAQ199  (This link is being provided for informational/ educational purposes only.)   Urinalysis, Routine w reflex microscopic     Status: Abnormal   Collection Time: 05/13/19  3:39 PM  Result Value Ref Range   Color, Urine YELLOW YELLOW   APPearance CLEAR CLEAR   Specific Gravity, Urine 1.005 1.001 - 1.03   pH 7.0 5.0 - 8.0   Glucose, UA NEGATIVE NEGATIVE   Bilirubin Urine NEGATIVE NEGATIVE   Ketones, ur NEGATIVE NEGATIVE   Hgb urine dipstick NEGATIVE NEGATIVE   Protein, ur NEGATIVE NEGATIVE   Nitrite NEGATIVE NEGATIVE   Leukocytes,Ua 1+ (A) NEGATIVE   WBC, UA 0-5 0 - 5 /HPF   RBC / HPF NONE SEEN 0 - 2 /HPF   Squamous Epithelial / LPF NONE SEEN < OR = 5 /HPF   Bacteria, UA NONE SEEN NONE SEEN /HPF   Hyaline Cast NONE SEEN NONE SEEN /LPF  COMPLETE METABOLIC PANEL WITH GFR     Status: None   Collection Time: 05/13/19  3:39 PM  Result Value Ref Range   Glucose, Bld 98 65 - 99 mg/dL    Comment: .            Fasting reference  interval .    BUN 11 7 - 25 mg/dL   Creat 0.91 0.50 - 1.05 mg/dL    Comment: For patients >48 years of age, the reference limit for Creatinine is approximately 13% higher for people identified as African-American. .    GFR, Est Non African American 73 > OR = 60 mL/min/1.31m2   GFR, Est African American 85 > OR = 60 mL/min/1.60m2   BUN/Creatinine Ratio NOT APPLICABLE 6 - 22 (calc)   Sodium 141 135 - 146 mmol/L   Potassium 4.2 3.5 - 5.3 mmol/L   Chloride 103 98 - 110 mmol/L   CO2 32 20 - 32 mmol/L   Calcium 9.5 8.6 - 10.4 mg/dL   Total Protein 7.7 6.1 - 8.1 g/dL   Albumin 4.4 3.6 - 5.1 g/dL   Globulin 3.3 1.9 - 3.7 g/dL (calc)   AG Ratio 1.3 1.0 - 2.5 (calc)   Total Bilirubin 0.3 0.2 - 1.2 mg/dL   Alkaline phosphatase (APISO) 80 37 - 153 U/L  AST 20 10 - 35 U/L   ALT 19 6 - 29 U/L  CBC with Differential/Platelet     Status: Abnormal   Collection Time: 05/13/19  3:39 PM  Result Value Ref Range   WBC 5.6 3.8 - 10.8 Thousand/uL   RBC 4.11 3.80 - 5.10 Million/uL   Hemoglobin 14.5 11.7 - 15.5 g/dL   HCT 41.9 35.0 - 45.0 %   MCV 101.9 (H) 80.0 - 100.0 fL   MCH 35.3 (H) 27.0 - 33.0 pg   MCHC 34.6 32.0 - 36.0 g/dL   RDW 11.8 11.0 - 15.0 %   Platelets 178 140 - 400 Thousand/uL   MPV 10.2 7.5 - 12.5 fL   Neutro Abs 2,408 1,500 - 7,800 cells/uL   Lymphs Abs 2,369 850 - 3,900 cells/uL   Absolute Monocytes 532 200 - 950 cells/uL   Eosinophils Absolute 263 15 - 500 cells/uL   Basophils Absolute 28 0 - 200 cells/uL   Neutrophils Relative % 43 %   Total Lymphocyte 42.3 %   Monocytes Relative 9.5 %   Eosinophils Relative 4.7 %   Basophils Relative 0.5 %  Sjogrens syndrome-A extractable nuclear antibody     Status: Abnormal   Collection Time: 05/13/19  3:39 PM  Result Value Ref Range   SSA (Ro) (ENA) Antibody, IgG >8.0 POS (A) <1.0 NEG AI  Sjogrens syndrome-B extractable nuclear antibody     Status: None   Collection Time: 05/13/19  3:39 PM  Result Value Ref Range   SSB (La)  (ENA) Antibody, IgG <1.0 NEG <1.0 NEG AI  Serum protein electrophoresis with reflex     Status: None   Collection Time: 05/13/19  3:39 PM  Result Value Ref Range   Total Protein 7.3 6.1 - 8.1 g/dL   Albumin ELP 4.1 3.8 - 4.8 g/dL   Alpha 1 0.2 0.2 - 0.3 g/dL   Alpha 2 0.6 0.5 - 0.9 g/dL   Beta Globulin 0.5 0.4 - 0.6 g/dL   Beta 2 0.5 0.2 - 0.5 g/dL   Gamma Globulin 1.3 0.8 - 1.7 g/dL   SPE Interp.      Comment: Normal Serum Protein Electrophoresis Pattern. No abnormal protein bands (M-protein) detected.   Rheumatoid factor     Status: None   Collection Time: 05/13/19  3:39 PM  Result Value Ref Range   Rhuematoid fact SerPl-aCnc <14 <14 IU/mL  ANA     Status: Abnormal   Collection Time: 05/13/19  3:39 PM  Result Value Ref Range   Anti Nuclear Antibody (ANA) POSITIVE (A) NEGATIVE    Comment: ANA IFA is a first line screen for detecting the presence of up to approximately 150 autoantibodies in various autoimmune diseases. A positive ANA IFA result is suggestive of autoimmune disease and reflexes to titer and pattern. Further laboratory testing may be considered if clinically indicated. . For additional information, please refer to http://education.[facility].com/faq/FAQ177 (This link is being provided for informational/ educational purposes only.) .   Anti-nuclear ab-titer (ANA titer)     Status: Abnormal   Collection Time: 05/13/19  3:39 PM  Result Value Ref Range   ANA Titer 1 1:320 (H) titer    Comment:                 Reference Range                 <1:40        Negative  1:40-1:80    Low Antibody Level                 >1:80        Elevated Antibody Level .    ANA Pattern 1 Nuclear, Speckled (A)     Comment: Speckled pattern is associated with mixed connective tissue disease (MCTD), systemic lupus erythematosus (SLE), Sjogren's syndrome, dermatomyositis, and  systemic sclerosis/polymyositis overlap. . AC-2,4,5,29: Speckled . International  Consensus on ANA Patterns ([email or web address].hernandez-[email or web address]/)    ANA TITER 1:40 (H) titer    Comment: A low level ANA titer may be present in pre-clinical autoimmune diseases and normal individuals.                 Reference Range                 <1:40        Negative                 1:40-1:80    Low Antibody Level                 >1:80        Elevated Antibody Level .    ANA PATTERN Cytoplasmic (A)     Comment: The presence of cytoplasmic fluorescence was noted on the HEp-2 slide. Other reactivities (e.g., anti- mitochondrial antibodies or anti-smooth muscle antibodies) may be responsible for this fluorescence. The clinical significance of this finding is uncertain. Clinical correlation is recommended. . AC-15 to AC-23: Cytoplasmic . International Consensus on ANA Patterns (https://www.hernandez-[email or web address]/)

## 2019-05-16 NOTE — Progress Notes (Signed)
Patient ID: Heidi Carney, female   DOB: Aug 04, 1965, 54 y.o.   MRN: [id number] .Marland KitchenVirtual Visit via Video Note  I connected with Heidi Carney on 05/16/19 at  9:10 AM EST by a video enabled telemedicine application and verified that I am speaking with the correct person using two identifiers.  Location: Patient: home Provider: clinic   I discussed the limitations of evaluation and management by telemedicine and the availability of in person appointments. The patient expressed understanding and agreed to proceed.  History of Present Illness: Pt is a 54 yo female with sjrogens, chronic rhinitis, food allergies who calls into the clinic to discuss urinary symptoms. She was seen on 2/9 with Rheumatology and urine showed leukocytes. She since has started having urinary urgency, pain, lower abdominal pressure. No fever, chills, flank, nausea. Not tried anything to make better.   .. Active Ambulatory Problems    Diagnosis Date Noted  . GAD (generalized anxiety disorder) 01/26/2015  . Elevated blood pressure reading 01/26/2015  . Thyroid activity decreased 01/26/2015  . Seborrheic dermatitis 03/10/2015  . Overweight 03/17/2015  . Perimenopausal symptoms 09/13/2015  . Hair loss 11/16/2015  . RLS (restless legs syndrome) 11/16/2015  . B12 deficiency 11/16/2015  . Vitamin D insufficiency 11/16/2015  . Migraine headache without aura 07/10/2016  . Essential hypertension 01/09/2017  . Left lower quadrant pain 01/24/2017  . Slow transit constipation 01/24/2017  . Right ovarian cyst 01/24/2017  . Change in stool 02/12/2017  . Nausea 07/07/2017  . Intractable cyclical vomiting with nausea 07/07/2017  . Diarrhea 07/07/2017  . Abdominal cramping 07/07/2017  . Allergic reaction 07/07/2017  . Multiple food allergies 07/12/2017  . Bouchard's nodes 12/17/2017  . Joint stiffness 12/17/2017  . Primary osteoarthritis of both hands 12/17/2017  . Surgical 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 replacement therapy  12/17/2017  . Colon polyp 12/18/2017  . Grade I internal hemorrhoids 12/18/2017  . Increased pressure in the eye, left 01/02/2018  . Allergic urticaria 02/06/2018  . Angioedema 02/06/2018  . Food intolerance 02/06/2018  . Chronic rhinitis 02/06/2018  . Family history of psoriatic arthritis 04/08/2018  . Family history of morphea 04/08/2018  . Sjogren's syndrome with keratoconjunctivitis sicca ([place]) 04/30/2018   Resolved Ambulatory Problems    Diagnosis Date Noted  . Atypical chest pain 01/26/2015  . Benign paroxysmal positional vertigo 01/09/2017   Past Medical History:  Diagnosis Date  . Anxiety   . Arthritis   . H/O total vaginal hysterectomy 05/2017  . Hypothyroidism   . Sjogren's disease ([place])    Reviewed med, allergy, problem list.     Observations/Objective: No acute distress. Normal mood and appearance.   .. Today's Vitals   05/16/19 0846  BP: 129/82  Pulse: 77  Temp: 97.6 F (36.4 C)  TempSrc: Oral  Weight: 169 lb (76.7 kg)  Height: 5\' 4"  (1.626 m)   Body mass index is 29.01 kg/m.  .. Results for orders placed or performed in visit on 05/13/19  VITAMIN D 25 Hydroxy (Vit-D Deficiency, Fractures)  Result Value Ref Range   Vit D, 25-Hydroxy 16 (L) 30 - 100 ng/mL  Urinalysis, Routine w reflex microscopic  Result Value Ref Range   Color, Urine YELLOW YELLOW   APPearance CLEAR CLEAR   Specific Gravity, Urine 1.005 1.001 - 1.03   pH 7.0 5.0 - 8.0   Glucose, UA NEGATIVE NEGATIVE   Bilirubin Urine NEGATIVE NEGATIVE   Ketones, ur NEGATIVE NEGATIVE   Hgb urine dipstick NEGATIVE NEGATIVE   Protein, ur NEGATIVE NEGATIVE  Nitrite NEGATIVE NEGATIVE   Leukocytes,Ua 1+ (A) NEGATIVE   WBC, UA 0-5 0 - 5 /HPF   RBC / HPF NONE SEEN 0 - 2 /HPF   Squamous Epithelial / LPF NONE SEEN < OR = 5 /HPF   Bacteria, UA NONE SEEN NONE SEEN /HPF   Hyaline Cast NONE SEEN NONE SEEN /LPF  COMPLETE METABOLIC PANEL WITH GFR  Result Value Ref Range   Glucose, Bld 98 65 - 99  mg/dL   BUN 11 7 - 25 mg/dL   Creat 0.91 0.50 - 1.05 mg/dL   GFR, Est Non African American 73 > OR = 60 mL/min/1.72m2   GFR, Est African American 85 > OR = 60 mL/min/1.48m2   BUN/Creatinine Ratio NOT APPLICABLE 6 - 22 (calc)   Sodium 141 135 - 146 mmol/L   Potassium 4.2 3.5 - 5.3 mmol/L   Chloride 103 98 - 110 mmol/L   CO2 32 20 - 32 mmol/L   Calcium 9.5 8.6 - 10.4 mg/dL   Total Protein 7.7 6.1 - 8.1 g/dL   Albumin 4.4 3.6 - 5.1 g/dL   Globulin 3.3 1.9 - 3.7 g/dL (calc)   AG Ratio 1.3 1.0 - 2.5 (calc)   Total Bilirubin 0.3 0.2 - 1.2 mg/dL   Alkaline phosphatase (APISO) 80 37 - 153 U/L   AST 20 10 - 35 U/L   ALT 19 6 - 29 U/L  CBC with Differential/Platelet  Result Value Ref Range   WBC 5.6 3.8 - 10.8 Thousand/uL   RBC 4.11 3.80 - 5.10 Million/uL   Hemoglobin 14.5 11.7 - 15.5 g/dL   HCT 41.9 35.0 - 45.0 %   MCV 101.9 (H) 80.0 - 100.0 fL   MCH 35.3 (H) 27.0 - 33.0 pg   MCHC 34.6 32.0 - 36.0 g/dL   RDW 11.8 11.0 - 15.0 %   Platelets 178 140 - 400 Thousand/uL   MPV 10.2 7.5 - 12.5 fL   Neutro Abs 2,408 1,500 - 7,800 cells/uL   Lymphs Abs 2,369 850 - 3,900 cells/uL   Absolute Monocytes 532 200 - 950 cells/uL   Eosinophils Absolute 263 15 - 500 cells/uL   Basophils Absolute 28 0 - 200 cells/uL   Neutrophils Relative % 43 %   Total Lymphocyte 42.3 %   Monocytes Relative 9.5 %   Eosinophils Relative 4.7 %   Basophils Relative 0.5 %  Sjogrens syndrome-A extractable nuclear antibody  Result Value Ref Range   SSA (Ro) (ENA) Antibody, IgG >8.0 POS (A) <1.0 NEG AI  Sjogrens syndrome-B extractable nuclear antibody  Result Value Ref Range   SSB (La) (ENA) Antibody, IgG <1.0 NEG <1.0 NEG AI  Serum protein electrophoresis with reflex  Result Value Ref Range   Total Protein 7.3 6.1 - 8.1 g/dL   Albumin ELP 4.1 3.8 - 4.8 g/dL   Alpha 1 0.2 0.2 - 0.3 g/dL   Alpha 2 0.6 0.5 - 0.9 g/dL   Beta Globulin 0.5 0.4 - 0.6 g/dL   Beta 2 0.5 0.2 - 0.5 g/dL   Gamma Globulin 1.3 0.8 - 1.7 g/dL    SPE Interp.    Rheumatoid factor  Result Value Ref Range   Rhuematoid fact SerPl-aCnc <14 <14 IU/mL  ANA  Result Value Ref Range   Anti Nuclear Antibody (ANA) POSITIVE (A) NEGATIVE  Anti-nuclear ab-titer (ANA titer)  Result Value Ref Range   ANA Titer 1 1:320 (H) titer   ANA Pattern 1 Nuclear, Speckled (A)    ANA TITER  1:40 (H) titer   ANA PATTERN Cytoplasmic (A)      Assessment and Plan: Marland KitchenMarland KitchenGregg was seen today for urinary urgency.  Diagnoses and all orders for this visit:  Dysuria -     phenazopyridine (PYRIDIUM) 200 MG tablet; Take 1 tablet (200 mg total) by mouth 3 (three) times daily for 2 days. -     nitrofurantoin, macrocrystal-monohydrate, (MACROBID) 100 MG capsule; Take 1 capsule (100 mg total) by mouth 2 (two) times daily.  Acquired hypothyroidism -     Levothyroxine Sodium (TIROSINT) 75 MCG CAPS; TAKE 1 CAPSULE (75 MCG TOTAL) BY MOUTH DAILY BEFORE BREAKFAST.  Lower abdominal pain -     phenazopyridine (PYRIDIUM) 200 MG tablet; Take 1 tablet (200 mg total) by mouth 3 (three) times daily for 2 days. -     nitrofurantoin, macrocrystal-monohydrate, (MACROBID) 100 MG capsule; Take 1 capsule (100 mg total) by mouth 2 (two) times daily.  Chronic rhinitis -     levocetirizine (XYZAL) 5 MG tablet; Take 1 tablet (5 mg total) by mouth at bedtime.  Allergic urticaria -     levocetirizine (XYZAL) 5 MG tablet; Take 1 tablet (5 mg total) by mouth at bedtime.  Leukocytes in urine -     phenazopyridine (PYRIDIUM) 200 MG tablet; Take 1 tablet (200 mg total) by mouth 3 (three) times daily for 2 days. -     nitrofurantoin, macrocrystal-monohydrate, (MACROBID) 100 MG capsule; Take 1 capsule (100 mg total) by mouth 2 (two) times daily.   Reviewed labs on 05/13/19 from Rheumatology.  Leukocytes in urine. Due to symptoms starting macrobid and pyridium. Stay hydrated. Follow up as needed.   Refilled xyzal.   Refilled Tirosinct.  Labs in 4-6 months.  Follow Up Instructions:     I discussed the assessment and treatment plan with the patient. The patient was provided an opportunity to ask questions and all were answered. The patient agreed with the plan and demonstrated an understanding of the instructions.   The patient was advised to call back or seek an in-person evaluation if the symptoms worsen or if the condition fails to improve as anticipated.  I provided 11 minutes of non-face-to-face time during this encounter.   Iran Planas, PA-C

## 2019-05-16 NOTE — Telephone Encounter (Signed)
Pt was seen via appt today.

## 2019-05-28 ENCOUNTER — Encounter: Payer: Self-pay | Admitting: Physician Assistant

## 2019-05-28 NOTE — Telephone Encounter (Signed)
Pt sent a MyChart msg. Requesting advise regarding Covid vaccine and vit d levels.

## 2019-06-19 ENCOUNTER — Encounter: Payer: Self-pay | Admitting: Physician Assistant

## 2019-06-20 ENCOUNTER — Other Ambulatory Visit: Payer: Self-pay

## 2019-06-20 ENCOUNTER — Ambulatory Visit (INDEPENDENT_AMBULATORY_CARE_PROVIDER_SITE_OTHER): Payer: BC Managed Care – PPO | Admitting: Physician Assistant

## 2019-06-20 ENCOUNTER — Encounter: Payer: Self-pay | Admitting: Physician Assistant

## 2019-06-20 VITALS — BP 149/64 | HR 86 | Temp 97.5°F | Wt 175.0 lb

## 2019-06-20 DIAGNOSIS — R300 Dysuria: Secondary | ICD-10-CM | POA: Diagnosis not present

## 2019-06-20 DIAGNOSIS — I10 Essential (primary) hypertension: Secondary | ICD-10-CM

## 2019-06-20 DIAGNOSIS — Z79890 Hormone replacement therapy (postmenopausal): Secondary | ICD-10-CM

## 2019-06-20 DIAGNOSIS — E8940 Asymptomatic postprocedural ovarian failure: Secondary | ICD-10-CM

## 2019-06-20 DIAGNOSIS — R82998 Other abnormal findings in urine: Secondary | ICD-10-CM | POA: Diagnosis not present

## 2019-06-20 DIAGNOSIS — N390 Urinary tract infection, site not specified: Secondary | ICD-10-CM

## 2019-06-20 DIAGNOSIS — M3501 Sicca syndrome with keratoconjunctivitis: Secondary | ICD-10-CM

## 2019-06-20 DIAGNOSIS — R1030 Lower abdominal pain, unspecified: Secondary | ICD-10-CM | POA: Diagnosis not present

## 2019-06-20 DIAGNOSIS — N3000 Acute cystitis without hematuria: Secondary | ICD-10-CM

## 2019-06-20 LAB — POCT UA - GLUCOSE/PROTEIN
Bilirubin: NEGATIVE
Blood: NEGATIVE
Glucose, UA: NEGATIVE
Glucose: NEGATIVE
Ketone: NEGATIVE
Nitrite: NEGATIVE
Protein, UA: NEGATIVE
Specific Gravity: 1.015
Urobilinogen, UA: 0.2 E.U./dL
pH: 7

## 2019-06-20 MED ORDER — PHENAZOPYRIDINE HCL 200 MG PO TABS: 200.0000 mg | ORAL_TABLET | Freq: Three times a day (TID) | ORAL | 0 refills | Status: AC

## 2019-06-20 MED ORDER — ESTRADIOL 0.1 MG/GM VA CREA: 1.0000 | TOPICAL_CREAM | VAGINAL | 6 refills | Status: DC

## 2019-06-20 MED ORDER — AMOXICILLIN 500 MG PO CAPS: 500.0000 mg | ORAL_CAPSULE | Freq: Two times a day (BID) | ORAL | 0 refills | Status: DC

## 2019-06-21 ENCOUNTER — Other Ambulatory Visit: Payer: Self-pay | Admitting: Physician Assistant

## 2019-06-21 DIAGNOSIS — F411 Generalized anxiety disorder: Secondary | ICD-10-CM

## 2019-06-21 DIAGNOSIS — F439 Reaction to severe stress, unspecified: Secondary | ICD-10-CM

## 2019-06-22 LAB — URINE CULTURE
MICRO NUMBER:: 10271161
SPECIMEN QUALITY:: ADEQUATE

## 2019-06-23 NOTE — Progress Notes (Signed)
Subjective:    Patient ID: Heidi Carney, female    DOB: [birth year], 54 y.o.   MRN: [id number]  HPI  Pt is a 54 yo female with Sjogrens disease, hypertension, recurrent UTIs who presents to the clinic with UTI symptoms.  Her last UTI was 1 month ago.  I do think that her surgical menopause and autoimmune disease is contributing to her frequent UTIs.  She has had the symptoms for the last 3 to 4 days.  She is having some dysuria, lower abdominal cramping and just feeling bad.  She denies any fever, flank pain, nausea or vomiting.  She has not tried anything to make better.  She did completely resolved from symptoms last month that were similar after antibiotic and Pyridium.  .. Active Ambulatory Problems    Diagnosis Date Noted  . GAD (generalized anxiety disorder) 01/26/2015  . Elevated blood pressure reading 01/26/2015  . Thyroid activity decreased 01/26/2015  . Seborrheic dermatitis 03/10/2015  . Overweight 03/17/2015  . Perimenopausal symptoms 09/13/2015  . Hair loss 11/16/2015  . RLS (restless legs syndrome) 11/16/2015  . B12 deficiency 11/16/2015  . Vitamin D insufficiency 11/16/2015  . Migraine headache without aura 07/10/2016  . Essential hypertension 01/09/2017  . Left lower quadrant pain 01/24/2017  . Slow transit constipation 01/24/2017  . Right ovarian cyst 01/24/2017  . Change in stool 02/12/2017  . Nausea 07/07/2017  . Intractable cyclical vomiting with nausea 07/07/2017  . Diarrhea 07/07/2017  . Abdominal cramping 07/07/2017  . Allergic reaction 07/07/2017  . Multiple food allergies 07/12/2017  . Bouchard's nodes 12/17/2017  . Joint stiffness 12/17/2017  . Primary osteoarthritis of both hands 12/17/2017  . 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 Colon polyp 12/18/2017  . Grade I internal hemorrhoids 12/18/2017  . Increased pressure in the eye, left 01/02/2018  . Allergic urticaria 02/06/2018  . Angioedema 02/06/2018  . Food  intolerance 02/06/2018  . Chronic rhinitis 02/06/2018  . Family history of psoriatic arthritis 04/08/2018  . Family history of morphea 04/08/2018  . Sjogren's syndrome with keratoconjunctivitis sicca ([place]) 04/30/2018   Resolved Ambulatory Problems    Diagnosis Date Noted  . Atypical chest pain 01/26/2015  . Benign paroxysmal positional vertigo 01/09/2017   Past Medical History:  Diagnosis Date  . Anxiety   . Arthritis   . H/O total vaginal hysterectomy 05/2017  . Hypothyroidism   . Sjogren's disease ([place])        Review of Systems See HPI.     Objective:   Physical Exam Vitals reviewed.  Constitutional:      Appearance: She is well-developed.  HENT:     Head: Normocephalic.  Cardiovascular:     Rate and Rhythm: Normal rate and regular rhythm.  Pulmonary:     Effort: Pulmonary effort is normal.     Breath sounds: Normal breath sounds.  Abdominal:     General: Bowel sounds are normal. There is no distension.     Palpations: Abdomen is soft.     Tenderness: There is abdominal tenderness in the right lower quadrant, suprapubic area and left lower quadrant. There is no right CVA tenderness or left CVA tenderness.  Neurological:     General: No focal deficit present.     Mental Status: She is alert.  Psychiatric:        Mood and Affect: Mood normal.          .. Results for orders placed or performed in visit on 06/20/19  Urine  Culture   Specimen: Urine  Result Value Ref Range   MICRO NUMBER: [id number]    SPECIMEN QUALITY: Adequate    Sample Source URINE    STATUS: FINAL    Result:      Additional non-predominating organism(s) isolated. These organisms, commonly found on external and internal genitalia, are considered colonizers. No further testing performed.   ISOLATE 1: Streptococcus agalactiae (A)   POCT UA - Glucose/Protein  Result Value Ref Range   Glucose, UA Negative Negative   Protein, UA Negative Negative   Glucose negative    Bilirubin negative     Ketone negative    Specific Gravity 1.015    Blood negative    pH 7.0    Urobilinogen, UA 0.2 0.2 or 1.0 E.U./dL   Nitrite negative    Leukocytes,Ua moderate     Assessment & Plan:  Heidi KitchenMarland KitchenCason was seen today for abdominal pain.  Diagnoses and all orders for this visit:  Acute cystitis without hematuria -     phenazopyridine (PYRIDIUM) 200 MG tablet; Take 1 tablet (200 mg total) by mouth 3 (three) times daily for 2 days. -     amoxicillin (AMOXIL) 500 MG capsule; Take 1 capsule (500 mg total) by mouth 2 (two) times daily. For 10 days.  Lower abdominal pain -     POCT UA - Glucose/Protein -     Urine Culture  Dysuria  Leukocytes in urine -     phenazopyridine (PYRIDIUM) 200 MG tablet; Take 1 tablet (200 mg total) by mouth 3 (three) times daily for 2 days.  Essential hypertension  Sjogren's syndrome with keratoconjunctivitis sicca ([facility]) -     estradiol (ESTRACE VAGINAL) 0.1 MG/GM vaginal cream; Place 1 Applicatorful vaginally 3 (three) times a week.  Surgical 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 replacement therapy -     estradiol (ESTRACE VAGINAL) 0.1 MG/GM vaginal cream; Place 1 Applicatorful vaginally 3 (three) times a week.  Recurrent UTI -     estradiol (ESTRACE VAGINAL) 0.1 MG/GM vaginal cream; Place 1 Applicatorful vaginally 3 (three) times a week.   Urine dipstick did show a moderate leukocytes but negative for blood, nitrate.  Will culture.  Since symptoms are very similar and with history we will go ahead and treat with amoxicillin.  Patient did not tolerate Macrobid well last time.  I do think she should consider Estrace for her UTI prevention.  Discussed how to use sparingly suggestion to start with 3 times a week.  She can use both vaginally and a pea-sized amount over the urethra.  I do think her autoimmune dryness is likely contributing to some of these recurrent UTI symptoms and overall dryness.  BP a little elevated today. Keep checking at home. Goal under 140/90.   Follow  up in 3 months.

## 2019-06-23 NOTE — Progress Notes (Signed)
[name],   Streptococcus agalactiae detected. Amoxicillin should work great.   Heidi Carney

## 2019-07-23 ENCOUNTER — Encounter: Payer: Self-pay | Admitting: Physician Assistant

## 2019-07-23 DIAGNOSIS — E039 Hypothyroidism, unspecified: Secondary | ICD-10-CM

## 2019-07-24 NOTE — Addendum Note (Signed)
Addended by: Towana Badger on: 07/24/2019 09:38 AM   Modules accepted: Orders

## 2019-07-24 NOTE — Telephone Encounter (Signed)
Ok to add thyroid labs. TSH

## 2019-07-30 ENCOUNTER — Other Ambulatory Visit: Payer: Self-pay | Admitting: Rheumatology

## 2019-07-30 LAB — TSH: TSH: 3.19 mIU/L

## 2019-08-04 ENCOUNTER — Telehealth: Payer: Self-pay | Admitting: Rheumatology

## 2019-08-04 DIAGNOSIS — E559 Vitamin D deficiency, unspecified: Secondary | ICD-10-CM

## 2019-08-04 NOTE — Telephone Encounter (Signed)
Vitamin D has been released for [facility]. Patient is aware.

## 2019-08-04 NOTE — Telephone Encounter (Signed)
Patient called requesting labwork for Vitamin D test to be sent to [facility] in [place].  Patient states she will be going on Wednesday, 08/06/19.

## 2019-08-07 LAB — VITAMIN D 25 HYDROXY (VIT D DEFICIENCY, FRACTURES): Vit D, 25-Hydroxy: 103 ng/mL — ABNORMAL HIGH (ref 30–100)

## 2019-08-07 NOTE — Telephone Encounter (Signed)
Patient should discontinue vitamin D.  After 3 months she should start on a maintenance dose of 1000 units a day.

## 2019-08-20 ENCOUNTER — Other Ambulatory Visit: Payer: Self-pay | Admitting: Physician Assistant

## 2019-08-20 DIAGNOSIS — E538 Deficiency of other specified B group vitamins: Secondary | ICD-10-CM

## 2019-08-25 ENCOUNTER — Encounter: Payer: Self-pay | Admitting: Physician Assistant

## 2019-08-26 ENCOUNTER — Ambulatory Visit (INDEPENDENT_AMBULATORY_CARE_PROVIDER_SITE_OTHER): Payer: BC Managed Care – PPO | Admitting: Nurse Practitioner

## 2019-08-26 ENCOUNTER — Encounter: Payer: Self-pay | Admitting: Nurse Practitioner

## 2019-08-26 VITALS — BP 142/80 | HR 71 | Temp 97.5°F | Ht 64.0 in | Wt 176.8 lb

## 2019-08-26 DIAGNOSIS — N368 Other specified disorders of urethra: Secondary | ICD-10-CM

## 2019-08-26 DIAGNOSIS — R300 Dysuria: Secondary | ICD-10-CM | POA: Diagnosis not present

## 2019-08-26 LAB — POCT URINALYSIS DIP (CLINITEK)
Bilirubin, UA: NEGATIVE
Blood, UA: NEGATIVE
Glucose, UA: NEGATIVE mg/dL
Ketones, POC UA: NEGATIVE mg/dL
Leukocytes, UA: NEGATIVE
Nitrite, UA: NEGATIVE
POC PROTEIN,UA: NEGATIVE
Spec Grav, UA: 1.015 (ref 1.010–1.025)
Urobilinogen, UA: 0.2 E.U./dL
pH, UA: 6 (ref 5.0–8.0)

## 2019-08-26 NOTE — Progress Notes (Signed)
Acute Office Visit  Subjective:    Patient ID: Heidi Carney, female    DOB: 1965/06/29, 54 y.o.   MRN: [id number]  Chief Complaint  Patient presents with  . Dysuria    HPI Patient is in today for feeling of irritation around the urinary meatus, lower abdominal pain, a sense of urinary urgency that started on Friday and into Saturday.  Until about 1 to 1-1/2 weeks ago she had been using Replens vaginal gel to help with vaginal irritation and dryness related to Sjogren's and menopause.  She has been using that for the past several months and has found it to be effective in prevention of urinary symptoms.  She was not sure if she needed to continue it so she stopped.  She noticed on Friday that she started to have urinary symptoms again and at that time she took a cranberry supplement until she can get into the office.  She does report that her symptoms are a little improved today.  She does report her gynecologist did tell her that she had evidence of vaginal atrophy at her last visit.  She has been prescribed estradiol vaginal cream to help with her vaginal dryness however she is hesitant to utilize this as she has a family history of "female" cancer.  Dysuria? no Blood in urine? no Frequency? yes Incontinence? no Abnormal color or odor of urine? no Days of symptoms? 4-5 Any fever? no Nausea? no Vomiting? no Back pain? yes   Past Medical History:  Diagnosis Date  . Anxiety   . Arthritis   . H/O total vaginal hysterectomy 05/2017  . Hypothyroidism   . Sjogren's disease [facility])     Past Surgical History:  Procedure Laterality Date  . CESAREAN SECTION  2012  . EYE SURGERY    . LAPAROSCOPIC TOTAL HYSTERECTOMY  05/2017    Family History  Problem Relation Age of Onset  . Diabetes Father   . Cancer Father        prostate CA  . Stroke Father   . Glaucoma Mother   . Asthma Sister   . Psoriasis Sister        psoriatic arthritis  . Arthritis Sister   . Scleroderma Sister   .  Healthy Daughter   . Allergic rhinitis Neg Hx   . Angioedema Neg Hx   . Eczema Neg Hx   . Immunodeficiency Neg Hx   . Urticaria Neg Hx     Social History   Socioeconomic History  . Marital status: Married    Spouse name: Not on file  . Number of children: Not on file  . Years of education: Not on file  . Highest education level: Not on file  Occupational History  . Not on file  Tobacco Use  . Smoking status: Never Smoker  . Smokeless tobacco: Never Used  Substance and Sexual Activity  . Alcohol use: Not Currently  . Drug use: Never  . Sexual activity: Yes  Other Topics Concern  . Not on file  Social History Narrative  . Not on file   Social Determinants of Health   Financial Resource Strain:   . Difficulty of Paying Living Expenses:   Food Insecurity:   . Worried About Charity fundraiser in the Last Year:   . Arboriculturist in the Last Year:   Transportation Needs:   . Film/video editor (Medical):   Marland Kitchen Lack of Transportation (Non-Medical):   Physical Activity:   . Days of  Exercise per Week:   . Minutes of Exercise per Session:   Stress:   . Feeling of Stress :   Social Connections:   . Frequency of Communication with Friends and Family:   . Frequency of Social Gatherings with Friends and Family:   . Attends Religious Services:   . Active Member of Clubs or Organizations:   . Attends Archivist Meetings:   Marland Kitchen Marital Status:   Intimate Partner Violence:   . Fear of Current or Ex-Partner:   . Emotionally Abused:   Marland Kitchen Physically Abused:   . Sexually Abused:     Outpatient Medications Prior to Visit  Medication Sig Dispense Refill  . amoxicillin (AMOXIL) 500 MG capsule Take 1 capsule (500 mg total) by mouth 2 (two) times daily. For 10 days. 20 capsule 0  . cyanocobalamin (,VITAMIN B-12,) 1000 MCG/ML injection TAKE 1ML ONCE A MONTH. 3 mL 1  . diclofenac sodium (VOLTAREN) 1 % GEL Apply 4 g topically 4 (four) times daily. To affected joint. 100 g  1  . EPINEPHrine (AUVI-Q) 0.3 mg/0.3 mL IJ SOAJ injection Use as directed for severe allergic reactions. 4 Device 1  . estradiol (ESTRACE VAGINAL) 0.1 MG/GM vaginal cream Place 1 Applicatorful vaginally 3 (three) times a week. 42.5 g 6  . levocetirizine (XYZAL) 5 MG tablet Take 1 tablet (5 mg total) by mouth at bedtime. 90 tablet 3  . Levothyroxine Sodium (TIROSINT) 75 MCG CAPS TAKE 1 CAPSULE (75 MCG TOTAL) BY MOUTH DAILY BEFORE BREAKFAST. 90 capsule 1  . lidocaine (XYLOCAINE) 2 % jelly lidocaine HCl 2 % mucosal jelly  Take 1 mg every 4 hours by mucous route as needed.    . nystatin-triamcinolone (MYCOLOG II) cream Apply 1 application topically as needed.     . triamcinolone ointment (KENALOG) 0.5 % APPLY TOPICALLY AS NEEDED    . venlafaxine XR (EFFEXOR-XR) 150 MG 24 hr capsule TAKE 1 CAPSULE (150 MG TOTAL) BY MOUTH DAILY WITH BREAKFAST. 90 capsule 1  . Vitamin D, Ergocalciferol, (DRISDOL) 1.25 MG (50000 UNIT) CAPS capsule Take 1 capsule (50,000 Units total) by mouth 2 (two) times a week. 24 capsule 0   No facility-administered medications prior to visit.    Allergies  Allergen Reactions  . Shrimp [Shellfish Allergy]     Lip and tongue swelling.   Sheliah Hatch [Citalopram Hydrobromide]     Made feel anxiety worse  . Effexor [Venlafaxine]     Blurry vision  . Sulfur     Possible reaction with lip tingling/SOB/itching  . Macrobid [Nitrofurantoin]     Bloated/itchy like a sulfite reaction  . Sulfites   . Synthroid [Levothyroxine Sodium]     Facial itching    Review of Systems  Constitutional: Negative for activity change, appetite change, chills, fatigue and fever.  Gastrointestinal: Negative for abdominal distention, abdominal pain, constipation, diarrhea, nausea and vomiting.  Genitourinary: Positive for frequency, pelvic pain and urgency. Negative for decreased urine volume, dysuria, flank pain, hematuria, menstrual problem, vaginal bleeding, vaginal discharge and vaginal pain.        Objective:    Physical Exam Vitals and nursing note reviewed.  Constitutional:      Appearance: Normal appearance.  HENT:     Head: Normocephalic.  Eyes:     Extraocular Movements: Extraocular movements intact.     Conjunctiva/sclera: Conjunctivae normal.     Pupils: Pupils are equal, round, and reactive to light.  Cardiovascular:     Rate and Rhythm: Normal rate and regular  rhythm.     Pulses: Normal pulses.     Heart sounds: Normal heart sounds.  Pulmonary:     Effort: Pulmonary effort is normal.     Breath sounds: Normal breath sounds.  Abdominal:     General: Abdomen is flat. Bowel sounds are normal. There is no distension.     Palpations: Abdomen is soft.     Tenderness: There is no abdominal tenderness. There is no right CVA tenderness or guarding.  Musculoskeletal:     Cervical back: Normal range of motion.     Right lower leg: No edema.     Left lower leg: No edema.  Skin:    General: Skin is warm and dry.     Capillary Refill: Capillary refill takes less than 2 seconds.  Neurological:     General: No focal deficit present.     Mental Status: She is alert and oriented to person, place, and time.  Psychiatric:        Mood and Affect: Mood normal.        Behavior: Behavior normal.        Thought Content: Thought content normal.        Judgment: Judgment normal.     Ht 5\' 4"  (1.626 m)   LMP 01/21/2015   BMI 30.04 kg/m  Wt Readings from Last 3 Encounters:  06/20/19 175 lb (79.4 kg)  05/16/19 169 lb (76.7 kg)  01/08/19 169 lb (76.7 kg)    Health Maintenance Due  Topic Date Due  . HIV Screening  Never done    There are no preventive care reminders to display for this patient.   Lab Results  Component Value Date   TSH 3.19 07/30/2019   Lab Results  Component Value Date   WBC 5.6 05/13/2019   HGB 14.5 05/13/2019   HCT 41.9 05/13/2019   MCV 101.9 (H) 05/13/2019   PLT 178 05/13/2019   Lab Results  Component Value Date   NA 141 05/13/2019   K  4.2 05/13/2019   CO2 32 05/13/2019   GLUCOSE 98 05/13/2019   BUN 11 05/13/2019   CREATININE 0.91 05/13/2019   BILITOT 0.3 05/13/2019   ALKPHOS 87 02/06/2018   AST 20 05/13/2019   ALT 19 05/13/2019   PROT 7.7 05/13/2019   PROT 7.3 05/13/2019   ALBUMIN 4.4 02/06/2018   CALCIUM 9.5 05/13/2019   Lab Results  Component Value Date   CHOL 152 03/12/2015   Lab Results  Component Value Date   HDL 48 03/12/2015   Lab Results  Component Value Date   LDLCALC 81 03/12/2015   Lab Results  Component Value Date   TRIG 117 03/12/2015   Lab Results  Component Value Date   CHOLHDL 3.2 03/12/2015   No results found for: HGBA1C     Assessment & Plan:   1. Dysuria Urinalysis performed today with negative findings. - POCT URINALYSIS DIP (CLINITEK)  2. Urethral irritation Symptoms and presentation consistent with urethral irritation due to suspected vaginal atrophy related to decreased hormone production after menopause induced by complete hysterectomy and dryness associated with Sjogren's syndrome.  Discussed with the patient the option to restart Replens vaginal gel as needed to help with vaginal dryness and irritation and to decrease the urethral discomfort.  We did discuss the option to not use estradiol if this makes her uncomfortable given her family history.  Urinalysis today was negative for any abnormalities. Information provided on vaginal atrophy for patient. Patient instructed to follow-up  if symptoms worsen or fail to improve.  Return if symptoms worsen or fail to improve.   Orma Render, NP

## 2019-08-26 NOTE — Patient Instructions (Signed)
Atrophic Vaginitis  Atrophic vaginitis is a condition in which the tissues that line the vagina become dry and thin. This condition is most common in women who have stopped having regular menstrual periods (are in menopause). This usually starts when a woman is 45-52 years old. That is the time when a woman's estrogen levels begin to drop (decrease). Estrogen is a female hormone. It helps to keep the tissues of the vagina moist. It stimulates the vagina to produce a clear fluid that lubricates the vagina for sexual intercourse. This fluid also protects the vagina from infection. Lack of estrogen can cause the lining of the vagina to get thinner and dryer. The vagina may also shrink in size. It may become less elastic. Atrophic vaginitis tends to get worse over time as a woman's estrogen level drops. What are the causes? This condition is caused by the normal drop in estrogen that happens around the time of menopause. What increases the risk? Certain conditions or situations may lower a woman's estrogen level, leading to a higher risk for atrophic vaginitis. You are more likely to develop this condition if:  You are taking medicines that block estrogen.  You have had your ovaries removed.  You are being treated for cancer with X-ray (radiation) or medicines (chemotherapy).  You have given birth or are breastfeeding.  You are older than age 50.  You smoke. What are the signs or symptoms? Symptoms of this condition include:  Pain, soreness, or bleeding during sexual intercourse (dyspareunia).  Vaginal burning, irritation, or itching.  Pain or bleeding when a speculum is used in a vaginal exam (pelvic exam).  Having burning pain when passing urine.  Vaginal discharge that is brown or yellow. In some cases, there are no symptoms. How is this diagnosed? This condition is diagnosed by taking a medical history and doing a physical exam. This will include a pelvic exam that checks the  vaginal tissues. Though rare, you may also have other tests, including:  A urine test.  A test that checks the acid balance in your vagina (acid balance test). How is this treated? Treatment for this condition depends on how severe your symptoms are. Treatment may include:  Using an over-the-counter vaginal lubricant before sex.  Using a long-acting vaginal moisturizer.  Using low-dose vaginal estrogen for moderate to severe symptoms that do not respond to other treatments. Options include creams, tablets, and inserts (vaginal rings). Before you use a vaginal estrogen, tell your health care provider if you have a history of: ? Breast cancer. ? Endometrial cancer. ? Blood clots. If you are not sexually active and your symptoms are very mild, you may not need treatment. Follow these instructions at home: Medicines  Take over-the-counter and prescription medicines only as told by your health care provider. Do not use herbal or alternative medicines unless your health care provider says that you can.  Use over-the-counter creams, lubricants, or moisturizers for dryness only as directed by your health care provider. General instructions  If your atrophic vaginitis is caused by menopause, discuss all of your menopause symptoms and treatment options with your health care provider.  Do not douche.  Do not use products that can make your vagina dry. These include: ? Scented feminine sprays. ? Scented tampons. ? Scented soaps.  Vaginal intercourse can help to improve blood flow and elasticity of vaginal tissue. If it hurts to have sex, try using a lubricant or moisturizer just before having intercourse. Contact a health care provider if:    Your discharge looks different than normal.  Your vagina has an unusual smell.  You have new symptoms.  Your symptoms do not improve with treatment.  Your symptoms get worse. Summary  Atrophic vaginitis is a condition in which the tissues that  line the vagina become dry and thin. It is most common in women who have stopped having regular menstrual periods (are in menopause).  Treatment options include using vaginal lubricants and low-dose vaginal estrogen.  Contact a health care provider if your vagina has an unusual smell, or if your symptoms get worse or do not improve after treatment. This information is not intended to replace advice given to you by your health care provider. Make sure you discuss any questions you have with your health care provider. Document Revised: 03/02/2017 Document Reviewed: 12/14/2016 Elsevier Patient Education  2020 [employer]  

## 2019-09-15 LAB — HM MAMMOGRAPHY

## 2019-10-09 ENCOUNTER — Encounter: Payer: Self-pay | Admitting: Physician Assistant

## 2019-10-27 NOTE — Progress Notes (Signed)
Office Visit Note  Patient: Heidi Carney             Date of Birth: 12-24-1965           MRN: [id number]             PCP: Lavada Mesi Referring: Donella Stade, PA-C Visit Date: 11/10/2019 Occupation: @GUAROCC @  Subjective:  Sicca symptoms   History of Present Illness: Heidi Carney is a 54 y.o. female with history of Sjogren's syndrome and osteoarthritis. She has been experiencing increased sicca symptoms recently.  She has been using biotene lozenges and eye ointment/gel for symptomatic relief.  She has also noticed dryness in her nose and ears.  She denies any recent rashes.  She denies any recent oral or nasal ulcerations.  She denies any swollen lymph nodes.  She has been experiencing increased fatigue.  She denies any increased joint pain or joint swelling.    Activities of Daily Living:  Patient reports morning stiffness for 5-10 minutes.   Patient Reports nocturnal pain.  Difficulty dressing/grooming: Denies Difficulty climbing stairs: Denies Difficulty getting out of chair: Denies Difficulty using hands for taps, buttons, cutlery, and/or writing: Denies  Review of Systems  Constitutional: Positive for fatigue.  HENT: Positive for mouth dryness and nose dryness. Negative for mouth sores.   Eyes: Positive for dryness. Negative for pain and visual disturbance.  Respiratory: Negative for cough, hemoptysis, shortness of breath and difficulty breathing.   Cardiovascular: Negative for chest pain, palpitations, hypertension and swelling in legs/feet.  Gastrointestinal: Positive for constipation. Negative for blood in stool and diarrhea.  Genitourinary: Positive for difficulty urinating. Negative for painful urination.  Musculoskeletal: Positive for morning stiffness. Negative for arthralgias, joint pain, joint swelling, myalgias, muscle weakness, muscle tenderness and myalgias.  Skin: Positive for rash. Negative for color change, pallor, hair loss, nodules/bumps,  skin tightness, ulcers and sensitivity to sunlight.  Allergic/Immunologic: Negative for susceptible to infections.  Neurological: Negative for dizziness, headaches, memory loss and weakness.  Hematological: Negative for bruising/bleeding tendency and swollen glands.  Psychiatric/Behavioral: Negative for depressed mood, confusion and sleep disturbance. The patient is not nervous/anxious.     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/2018   Angioedema 02/06/2018   Food intolerance 02/06/2018   Chronic rhinitis 02/06/2018   Increased pressure in the eye, left 01/02/2018   Colon polyp 12/18/2017   Grade I internal hemorrhoids 12/18/2017   Bouchard's nodes 12/17/2017   Joint stiffness 12/17/2017   Primary osteoarthritis of both hands 12/17/2017   Surgical 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 replacement therapy 12/17/2017   Multiple food allergies 07/12/2017   Nausea 07/07/2017   Intractable cyclical vomiting with nausea 07/07/2017   Diarrhea 07/07/2017   Abdominal cramping 07/07/2017   Allergic reaction 07/07/2017   Change in stool 02/12/2017   Left lower quadrant pain 01/24/2017   Slow transit constipation 01/24/2017   Right ovarian cyst 01/24/2017   Essential hypertension 01/09/2017   Migraine headache without aura 07/10/2016   Hair loss 11/16/2015   RLS (restless legs syndrome) 11/16/2015   B12 deficiency 11/16/2015   Vitamin D insufficiency 11/16/2015   Perimenopausal symptoms 09/13/2015   Overweight 03/17/2015   Seborrheic dermatitis 03/10/2015   GAD (generalized anxiety disorder) 01/26/2015   Elevated blood pressure reading 01/26/2015   Thyroid activity decreased 01/26/2015  Past Medical History:  Diagnosis Date   Anxiety    Arthritis     H/O total vaginal hysterectomy 05/2017   Hypothyroidism    Sjogren's disease ([place])     Family History  Problem Relation Age of Onset   Diabetes Father    Cancer Father        prostate CA   Stroke Father    Glaucoma Mother    Asthma Sister    Psoriasis Sister        psoriatic arthritis   Arthritis Sister    Scleroderma Sister    Healthy Daughter    Allergic rhinitis Neg Hx    Angioedema Neg Hx    Eczema Neg Hx    Immunodeficiency Neg Hx    Urticaria Neg Hx    Past Surgical History:  Procedure Laterality Date   CESAREAN SECTION  2012   EYE SURGERY     LAPAROSCOPIC TOTAL HYSTERECTOMY  05/2017   Social History   Social History Narrative   Not on file   Immunization History  Administered Date(s) Administered   Influenza Inj Mdck Quad Pf 01/08/2019   Influenza, Quadrivalent, Recombinant, Inj, Pf 12/21/2016, 03/15/2018   Influenza,inj,Quad PF,6+ Mos 03/17/2015, 02/02/2016   PFIZER SARS-COV-2 Vaccination 05/30/2019, 06/27/2019     Objective: Vital Signs: BP (!) 153/89 (BP Location: Left Arm, Patient Position: Sitting, Cuff Size: Normal)    Pulse 90    Resp 14    Ht 5\' 4"  (1.626 m)    Wt 178 lb 3.2 oz (80.8 kg)    LMP 01/21/2015    BMI 30.59 kg/m    Physical Exam Vitals and nursing note reviewed.  Constitutional:      Appearance: She is well-developed.  HENT:     Head: Normocephalic and atraumatic.     Mouth/Throat:     Comments: No parotid swelling or tenderness  Eyes:     Conjunctiva/sclera: Conjunctivae normal.  Cardiovascular:     Rate and Rhythm: Normal rate and regular rhythm.     Pulses: Normal pulses.     Heart sounds: Normal heart sounds.  Pulmonary:     Effort: Pulmonary effort is normal.     Breath sounds: Normal breath sounds.  Abdominal:     General: Bowel sounds are normal.     Palpations: Abdomen is soft.  Musculoskeletal:     Cervical back: Normal range of motion.  Lymphadenopathy:     Cervical: No cervical  adenopathy.  Skin:    General: Skin is warm and dry.     Capillary Refill: Capillary refill takes less than 2 seconds.  Neurological:     Mental Status: She is alert and oriented to person, place, and time.  Psychiatric:        Behavior: Behavior normal.      Musculoskeletal Exam:  C-spine, thoracic spine, and lumbar spine good ROM.  Shoulder joints, elbow joints, and wrist joints good ROM with no discomfort.  No tenderness or synovitis of MCP or PIP joints. Dupuytren's contracture of bilateral 3rd and 4th fingers. Hip joints, knee joints, and ankle joints, MTPs, PIPs, and DIPs good ROM with no synovitis.  No warmth or effusion of knee joints.  No tenderness or swelling of ankle joints.  CDAI Exam: CDAI Score: -- Patient Global: --; Provider Global: -- Swollen: --; Tender: -- Joint Exam 11/10/2019   No joint exam has been documented for this visit   There is currently no information documented on the homunculus. Go to the Rheumatology  activity and complete the homunculus joint exam.  Investigation: No additional findings.  Imaging: No results found.  Recent Labs: Lab Results  Component Value Date   WBC 5.6 05/13/2019   HGB 14.5 05/13/2019   PLT 178 05/13/2019   NA 141 05/13/2019   K 4.2 05/13/2019   CL 103 05/13/2019   CO2 32 05/13/2019   GLUCOSE 98 05/13/2019   BUN 11 05/13/2019   CREATININE 0.91 05/13/2019   BILITOT 0.3 05/13/2019   ALKPHOS 87 02/06/2018   AST 20 05/13/2019   ALT 19 05/13/2019   PROT 7.7 05/13/2019   PROT 7.3 05/13/2019   ALBUMIN 4.4 02/06/2018   CALCIUM 9.5 05/13/2019   GFRAA 85 05/13/2019    Speciality Comments: No specialty comments available.  Procedures:  No procedures performed Allergies: Shrimp [shellfish allergy], Celexa [citalopram hydrobromide], Effexor [venlafaxine], Sulfur, Macrobid [nitrofurantoin], Sulfites, and Synthroid [levothyroxine sodium]   Assessment / Plan:     Visit Diagnoses: Sjogren's syndrome with  keratoconjunctivitis sicca ([place]) - Sicca symptoms with positive ANA and positive Ro: She has been experiencing progressively worsening sicca symptoms.  She has been using Biotene lotions as well as eye gel for symptomatic relief.  She is also tried using a humidifier in the past without much improvement in her symptoms.  She has tried to increase her fluid intake as well.  She has no parotid swelling or tenderness on examination today.  No cervical lymphadenopathy was palpable.  She has not had any increased shortness of breath or palpitations.  Her lungs were clear to auscultation today.  She has no signs of inflammatory arthritis at this time.  She is not a good candidate for pilocarpine due to her history of elevated intraocular pressure.  We discussed starting her on Plaquenil 200 mg 1 tablet by mouth twice daily Monday through Friday.  Indications, contraindications, and potential side effects of Plaquenil were discussed today.  All questions were addressed and consent was obtained.  She will return for lab work in 1 month in 3 months and every 5 months to monitor for drug toxicity.  She was advised to schedule a baseline Plaquenil eye exam.  She was advised to notify us if she cannot tolerate taking Plaquenil as prescribed.  Prescription pending lab results obtained today.  She will follow-up in the office in 8 weeks to assess her response.- Plan: COMPLETE METABOLIC PANEL WITH GFR, CBC with Differential/Platelet, Serum protein electrophoresis with reflex, Rheumatoid factor, CBC with Differential/Platelet, COMPLETE METABOLIC PANEL WITH GFR  Patient was counseled on the purpose, proper use, and adverse effects of hydroxychloroquine including nausea/diarrhea, skin rash, headaches, and sun sensitivity.  Discussed importance of annual eye exams while on hydroxychloroquine to monitor to ocular toxicity and discussed importance of frequent laboratory monitoring.  Provided patient with eye exam form for baseline  ophthalmologic exam.  Provided patient with educational materials on hydroxychloroquine and answered all questions.  Patient consented to hydroxychloroquine.  Will upload consent in the media tab.    Dose will be Plaquenil 200 mg twice daily Monday through Friday.  Prescription pending lab results.  High risk medication use -Plaquenil 200 mg 1 tablet by mouth twice daily Monday through Friday.  CBC, CMP, and G6PD will be checked today.  Prescription pending lab results.  Plan: COMPLETE METABOLIC PANEL WITH GFR, CBC with Differential/Platelet, Glucose 6 phosphate dehydrogenase  Other fatigue -She has been experiencing intermittent bouts of fatigue which have been significant at times.  She requested to have her vitamin D, B12, and  iron panel checked today.  Orders were released.  Plan: VITAMIN D 25 Hydroxy (Vit-D Deficiency, Fractures), Vitamin B12, Iron, TIBC and Ferritin Panel, CBC with Differential/Platelet  Primary osteoarthritis of both hands: She experiences occasional discomfort and stiffness in her hands.  Effusion contractures of bilateral third and fourth fingers noted.  She has incomplete fist formation bilaterally.  Joint protection and muscle strengthening were discussed.  Chronic midline low back pain without sciatica: She is not experiencing any lower back pain at this time.  She has no symptoms of sciatica.  Vitamin D insufficiency -Vitamin D was 103 on 08/06/2019.  Patient requested to have vitamin D level checked today.  Orders were released.  Plan: VITAMIN D 25 Hydroxy (Vit-D Deficiency, Fractures)  Other medical conditions are listed as follows:   Seborrheic dermatitis  Migraine without aura and without status migrainosus, not intractable  Increased pressure in the eye, left  Essential hypertension  GAD (generalized anxiety disorder)  RLS (restless legs syndrome)  Family history of psoriatic arthritis - sister   Family history of morphea - sister    Orders: Orders  Placed This Encounter  Procedures   VITAMIN D 25 Hydroxy (Vit-D Deficiency, Fractures)   Vitamin B12   Iron, TIBC and Ferritin Panel   COMPLETE METABOLIC PANEL WITH GFR   CBC with Differential/Platelet   Serum protein electrophoresis with reflex   Rheumatoid factor   CBC with Differential/Platelet   COMPLETE METABOLIC PANEL WITH GFR   Glucose 6 phosphate dehydrogenase   No orders of the defined types were placed in this encounter.   Face-to-face time spent with patient was 30 minutes. Greater than 50% of time was spent in counseling and coordination of care.  Follow-Up Instructions: Return in about 8 weeks (around 01/05/2020) for Sjogren's syndrome, Osteoarthritis.   Ofilia Neas, PA-C   I examined and evaluated the patient with Hazel Sams PA.  Patient reports worsening of physical symptoms and fatigue.  She had no synovitis on examination.  We are detailed discussion regarding the use of Plaquenil.  Indications side effects contraindications were discussed at length.  She will be placed on Plaquenil after we get labs.  The plan of care was discussed as noted above.  Bo Merino, MD  Note - This record has been created using Editor, commissioning.  Chart creation errors have been sought, but may not always  have been located. Such creation errors do not reflect on  the standard of medical care.

## 2019-11-10 ENCOUNTER — Encounter: Payer: Self-pay | Admitting: Rheumatology

## 2019-11-10 ENCOUNTER — Other Ambulatory Visit: Payer: Self-pay

## 2019-11-10 ENCOUNTER — Ambulatory Visit: Payer: BC Managed Care – PPO | Admitting: Rheumatology

## 2019-11-10 VITALS — BP 153/89 | HR 90 | Resp 14 | Ht 64.0 in | Wt 178.2 lb

## 2019-11-10 DIAGNOSIS — G43009 Migraine without aura, not intractable, without status migrainosus: Secondary | ICD-10-CM

## 2019-11-10 DIAGNOSIS — Z8261 Family history of arthritis: Secondary | ICD-10-CM

## 2019-11-10 DIAGNOSIS — F411 Generalized anxiety disorder: Secondary | ICD-10-CM

## 2019-11-10 DIAGNOSIS — M19041 Primary osteoarthritis, right hand: Secondary | ICD-10-CM

## 2019-11-10 DIAGNOSIS — I10 Essential (primary) hypertension: Secondary | ICD-10-CM

## 2019-11-10 DIAGNOSIS — M19042 Primary osteoarthritis, left hand: Secondary | ICD-10-CM

## 2019-11-10 DIAGNOSIS — M545 Low back pain, unspecified: Secondary | ICD-10-CM

## 2019-11-10 DIAGNOSIS — R5383 Other fatigue: Secondary | ICD-10-CM

## 2019-11-10 DIAGNOSIS — L219 Seborrheic dermatitis, unspecified: Secondary | ICD-10-CM

## 2019-11-10 DIAGNOSIS — E559 Vitamin D deficiency, unspecified: Secondary | ICD-10-CM

## 2019-11-10 DIAGNOSIS — Z840 Family history of diseases of the skin and subcutaneous tissue: Secondary | ICD-10-CM

## 2019-11-10 DIAGNOSIS — M3501 Sicca syndrome with keratoconjunctivitis: Secondary | ICD-10-CM | POA: Diagnosis not present

## 2019-11-10 DIAGNOSIS — H40052 Ocular hypertension, left eye: Secondary | ICD-10-CM

## 2019-11-10 DIAGNOSIS — G2581 Restless legs syndrome: Secondary | ICD-10-CM

## 2019-11-10 DIAGNOSIS — G8929 Other chronic pain: Secondary | ICD-10-CM

## 2019-11-10 DIAGNOSIS — Z79899 Other long term (current) drug therapy: Secondary | ICD-10-CM

## 2019-11-10 NOTE — Patient Instructions (Addendum)
Standing Labs We placed an order today for your standing lab work.   Please have your standing labs drawn in 1 month, 3 months, then every 5 months   If possible, please have your labs drawn 2 weeks prior to your appointment so that the provider can discuss your results at your appointment.  We have open lab daily Monday through Thursday from 8:30-12:30 PM and 1:30-4:30 PM and Friday from 8:30-12:30 PM and 1:30-4:00 PM at the office of Dr. Shaili Deveshwar, [facility].   Please be advised, patients with office appointments requiring lab work will take precedents over walk-in lab work.  If possible, please come for your lab work on Monday and Friday afternoons, as you may experience shorter wait times. The office is located at [address], [address], [place], [place] [address] No appointment is necessary.   Labs are drawn by [facility]. Please bring your co-pay at the time of your lab draw.  You may receive a bill from [facility] for your lab work.  If you wish to have your labs drawn at another location, please call the office 24 hours in advance to send orders.  If you have any questions regarding directions or hours of operation,  please call [phone].   As a reminder, please drink plenty of water prior to coming for your lab work. Thanks!   Hydroxychloroquine tablets What is this medicine? HYDROXYCHLOROQUINE (hye drox ee KLOR oh kwin) is used to treat rheumatoid arthritis and systemic lupus erythematosus. It is also used to treat malaria. This medicine may be used for other purposes; ask your health care provider or pharmacist if you have questions. COMMON BRAND NAME(S): Plaquenil, Quineprox What should I tell my health care provider before I take this medicine? They need to know if you have any of these conditions:  diabetes  eye disease, vision problems  G6PD deficiency  heart disease  history of irregular heartbeat  if you often drink alcohol  kidney  disease  liver disease  porphyria  psoriasis  an unusual or allergic reaction to chloroquine, hydroxychloroquine, other medicines, foods, dyes, or preservatives  pregnant or trying to get pregnant  breast-feeding How should I use this medicine? Take this medicine by mouth with a glass of water. Follow the directions on the prescription label. Do not cut, crush or chew this medicine. Swallow the tablets whole. Take this medicine with food. Avoid taking antacids within 4 hours of taking this medicine. It is best to separate these medicines by at least 4 hours. Take your medicine at regular intervals. Do not take it more often than directed. Take all of your medicine as directed even if you think you are better. Do not skip doses or stop your medicine early. Talk to your pediatrician regarding the use of this medicine in children. While this drug may be prescribed for selected conditions, precautions do apply. Overdosage: If you think you have taken too much of this medicine contact a poison control center or emergency room at once. NOTE: This medicine is only for you. Do not share this medicine with others. What if I miss a dose? If you miss a dose, take it as soon as you can. If it is almost time for your next dose, take only that dose. Do not take double or extra doses. What may interact with this medicine? Do not take this medicine with any of the following medications:  cisapride  dronedarone  pimozide  thioridazine This medicine may also interact with the   following medications:  ampicillin  antacids  cimetidine  cyclosporine  digoxin  kaolin  medicines for diabetes, like insulin, glipizide, glyburide  medicines for seizures like carbamazepine, phenobarbital, phenytoin  mefloquine  methotrexate  other medicines that prolong the QT interval (cause an abnormal heart rhythm)  praziquantel This list may not describe all possible interactions. Give your health care  provider a list of all the medicines, herbs, non-prescription drugs, or dietary supplements you use. Also tell them if you smoke, drink alcohol, or use illegal drugs. Some items may interact with your medicine. What should I watch for while using this medicine? Visit your health care professional for regular checks on your progress. Tell your health care professional if your symptoms do not start to get better or if they get worse. You may need blood work done while you are taking this medicine. If you take other medicines that can affect heart rhythm, you may need more testing. Talk to your health care professional if you have questions. Your vision may be tested before and during use of this medicine. Tell your health care professional right away if you have any change in your eyesight. What side effects may I notice from receiving this medicine? Side effects that you should report to your doctor or health care professional as soon as possible:  allergic reactions like skin rash, itching or hives, swelling of the face, lips, or tongue  changes in vision  decreased hearing or ringing of the ears  muscle weakness  redness, blistering, peeling or loosening of the skin, including inside the mouth  sensitivity to light  signs and symptoms of a dangerous change in heartbeat or heart rhythm like chest pain; dizziness; fast or irregular heartbeat; palpitations; feeling faint or lightheaded, falls; breathing problems  signs and symptoms of liver injury like dark yellow or brown urine; general ill feeling or flu-like symptoms; light-colored stools; loss of appetite; nausea; right upper belly pain; unusually weak or tired; yellowing of the eyes or skin  signs and symptoms of low blood sugar such as feeling anxious; confusion; dizziness; increased hunger; unusually weak or tired; sweating; shakiness; cold; irritable; headache; blurred vision; fast heartbeat; loss of consciousness  suicidal  thoughts  uncontrollable head, mouth, neck, arm, or leg movements Side effects that usually do not require medical attention (report to your doctor or health care professional if they continue or are bothersome):  diarrhea  dizziness  hair loss  headache  irritable  loss of appetite  nausea, vomiting  stomach pain This list may not describe all possible side effects. Call your doctor for medical advice about side effects. You may report side effects to FDA at 1-800-FDA-1088. Where should I keep my medicine? Keep out of the reach of children. Store at room temperature between 15 and 30 degrees C (59 and 86 degrees F). Protect from moisture and light. Throw away any unused medicine after the expiration date. NOTE: This sheet is a summary. It may not cover all possible information. If you have questions about this medicine, talk to your doctor, pharmacist, or health care provider.  2020 Elsevier/Gold Standard (2018-07-29 12:56:32)  

## 2019-11-10 NOTE — Telephone Encounter (Signed)
Pending lab results, patient will be starting plaquenil per Hazel Sams, PA-C. Thanks!

## 2019-11-11 NOTE — Progress Notes (Signed)
Vitamin D is normal. She may continue to take vitamin D 1000 units a day now. B12 is normal. Iron studies are normal. CBC and CMP are normal. Rheumatoid factor is negative. G6PD is normal. Okay to send prescription for Plaquenil 200 mg p.o. twice daily Monday to Friday as discussed in the note.

## 2019-11-12 LAB — COMPLETE METABOLIC PANEL WITH GFR
AG Ratio: 1.3 (calc) (ref 1.0–2.5)
ALT: 22 U/L (ref 6–29)
AST: 19 U/L (ref 10–35)
Albumin: 4.3 g/dL (ref 3.6–5.1)
Alkaline phosphatase (APISO): 83 U/L (ref 37–153)
BUN: 10 mg/dL (ref 7–25)
CO2: 30 mmol/L (ref 20–32)
Calcium: 9 mg/dL (ref 8.6–10.4)
Chloride: 104 mmol/L (ref 98–110)
Creat: 0.85 mg/dL (ref 0.50–1.05)
GFR, Est African American: 91 mL/min/{1.73_m2} (ref >=60)
GFR, Est Non African American: 79 mL/min/{1.73_m2} (ref >=60)
Globulin: 3.2 g/dL (calc) (ref 1.9–3.7)
Glucose, Bld: 81 mg/dL (ref 65–99)
Potassium: 4 mmol/L (ref 3.5–5.3)
Sodium: 141 mmol/L (ref 135–146)
Total Bilirubin: 0.4 mg/dL (ref 0.2–1.2)
Total Protein: 7.5 g/dL (ref 6.1–8.1)

## 2019-11-12 LAB — IRON,TIBC AND FERRITIN PANEL
%SAT: 30 % (calc) (ref 16–45)
Ferritin: 62 ng/mL (ref 16–232)
Iron: 105 ug/dL (ref 45–160)
TIBC: 352 mcg/dL (calc) (ref 250–450)

## 2019-11-12 LAB — PROTEIN ELECTROPHORESIS, SERUM, WITH REFLEX
Albumin ELP: 4.3 g/dL (ref 3.8–4.8)
Alpha 1: 0.3 g/dL (ref 0.2–0.3)
Alpha 2: 0.7 g/dL (ref 0.5–0.9)
Beta 2: 0.5 g/dL (ref 0.2–0.5)
Beta Globulin: 0.5 g/dL (ref 0.4–0.6)
Gamma Globulin: 1.4 g/dL (ref 0.8–1.7)
Total Protein: 7.6 g/dL (ref 6.1–8.1)

## 2019-11-12 LAB — CBC WITH DIFFERENTIAL/PLATELET
Absolute Monocytes: 475 cells/uL (ref 200–950)
Basophils Absolute: 30 cells/uL (ref 0–200)
Basophils Relative: 0.6 %
Eosinophils Absolute: 150 cells/uL (ref 15–500)
Eosinophils Relative: 3 %
HCT: 40.3 % (ref 35.0–45.0)
Hemoglobin: 13.6 g/dL (ref 11.7–15.5)
Lymphs Abs: 1780 cells/uL (ref 850–3900)
MCH: 35.6 pg — ABNORMAL HIGH (ref 27.0–33.0)
MCHC: 33.7 g/dL (ref 32.0–36.0)
MCV: 105.5 fL — ABNORMAL HIGH (ref 80.0–100.0)
MPV: 10.1 fL (ref 7.5–12.5)
Monocytes Relative: 9.5 %
Neutro Abs: 2565 cells/uL (ref 1500–7800)
Neutrophils Relative %: 51.3 %
Platelets: 177 10*3/uL (ref 140–400)
RBC: 3.82 10*6/uL (ref 3.80–5.10)
RDW: 12.1 % (ref 11.0–15.0)
Total Lymphocyte: 35.6 %
WBC: 5 10*3/uL (ref 3.8–10.8)

## 2019-11-12 LAB — RHEUMATOID FACTOR: Rheumatoid fact SerPl-aCnc: <14 IU/mL (ref <14)

## 2019-11-12 LAB — VITAMIN D 25 HYDROXY (VIT D DEFICIENCY, FRACTURES): Vit D, 25-Hydroxy: 42 ng/mL (ref 30–100)

## 2019-11-12 LAB — VITAMIN B12: Vitamin B-12: 322 pg/mL (ref 200–1100)

## 2019-11-12 LAB — GLUCOSE 6 PHOSPHATE DEHYDROGENASE: G-6PDH: 12.7 U/g Hgb (ref 7.0–20.5)

## 2019-11-12 MED ORDER — HYDROXYCHLOROQUINE SULFATE 200 MG PO TABS
ORAL_TABLET | ORAL | 0 refills | Status: DC
Start: 2019-11-12 — End: 2020-02-02

## 2019-11-12 NOTE — Telephone Encounter (Signed)
Okay to send prescription for Plaquenil 200 mg p.o. twice daily Monday to Friday as discussed in the note.

## 2019-12-04 ENCOUNTER — Encounter: Payer: Self-pay | Admitting: Rheumatology

## 2019-12-05 NOTE — Telephone Encounter (Signed)
I agree with Heidi Carney's suggestions.  Please notify the patient to hold Plaquenil until she has been evaluated by her ophthalmologist.

## 2019-12-05 NOTE — Telephone Encounter (Signed)
Patient started Plaquenil in August 2021. It is very rare that Plaquenil causes rapid onset of retinopathy and is more associated with higher dosing ([phone] mg daily) for cancer treatment.    It also sounds like she is not taking the medication as prescribed 200 mg twice daily M-F.    There is no baseline eye exam on file.  Recommend patient hold Plaquenil and obtain baseline eye exam. We can revisit resuming Plaquenil pending eye exam results.   Mariella Saa, PharmD, [place], CPP Clinical Specialty Pharmacist (Rheumatology and Pulmonology)  12/05/2019 8:59 AM

## 2019-12-14 ENCOUNTER — Other Ambulatory Visit: Payer: Self-pay | Admitting: Physician Assistant

## 2019-12-14 DIAGNOSIS — F439 Reaction to severe stress, unspecified: Secondary | ICD-10-CM

## 2019-12-14 DIAGNOSIS — F411 Generalized anxiety disorder: Secondary | ICD-10-CM

## 2019-12-16 ENCOUNTER — Other Ambulatory Visit: Payer: Self-pay | Admitting: *Deleted

## 2019-12-16 DIAGNOSIS — M3501 Sicca syndrome with keratoconjunctivitis: Secondary | ICD-10-CM

## 2019-12-19 LAB — CBC WITH DIFFERENTIAL/PLATELET
Absolute Monocytes: 593 cells/uL (ref 200–950)
Basophils Absolute: 42 cells/uL (ref 0–200)
Basophils Relative: 0.8 %
Eosinophils Absolute: 244 cells/uL (ref 15–500)
Eosinophils Relative: 4.7 %
HCT: 38.6 % (ref 35.0–45.0)
Hemoglobin: 13.3 g/dL (ref 11.7–15.5)
Lymphs Abs: 1804 cells/uL (ref 850–3900)
MCH: 35.8 pg — ABNORMAL HIGH (ref 27.0–33.0)
MCHC: 34.5 g/dL (ref 32.0–36.0)
MCV: 103.8 fL — ABNORMAL HIGH (ref 80.0–100.0)
MPV: 10.4 fL (ref 7.5–12.5)
Monocytes Relative: 11.4 %
Neutro Abs: 2517 cells/uL (ref 1500–7800)
Neutrophils Relative %: 48.4 %
Platelets: 179 10*3/uL (ref 140–400)
RBC: 3.72 10*6/uL — ABNORMAL LOW (ref 3.80–5.10)
RDW: 11.7 % (ref 11.0–15.0)
Total Lymphocyte: 34.7 %
WBC: 5.2 10*3/uL (ref 3.8–10.8)

## 2019-12-19 LAB — COMPLETE METABOLIC PANEL WITH GFR
AG Ratio: 1.4 (calc) (ref 1.0–2.5)
ALT: 16 U/L (ref 6–29)
AST: 18 U/L (ref 10–35)
Albumin: 4.2 g/dL (ref 3.6–5.1)
Alkaline phosphatase (APISO): 71 U/L (ref 37–153)
BUN: 11 mg/dL (ref 7–25)
CO2: 29 mmol/L (ref 20–32)
Calcium: 8.9 mg/dL (ref 8.6–10.4)
Chloride: 103 mmol/L (ref 98–110)
Creat: 0.95 mg/dL (ref 0.50–1.05)
GFR, Est African American: 80 mL/min/{1.73_m2} (ref >=60)
GFR, Est Non African American: 69 mL/min/{1.73_m2} (ref >=60)
Globulin: 2.9 g/dL (calc) (ref 1.9–3.7)
Glucose, Bld: 74 mg/dL (ref 65–139)
Potassium: 4.2 mmol/L (ref 3.5–5.3)
Sodium: 139 mmol/L (ref 135–146)
Total Bilirubin: 0.5 mg/dL (ref 0.2–1.2)
Total Protein: 7.1 g/dL (ref 6.1–8.1)

## 2019-12-22 ENCOUNTER — Encounter: Payer: Self-pay | Admitting: Physician Assistant

## 2019-12-22 NOTE — Progress Notes (Signed)
CMP WNL.  RBC count is borderline low. MCV and MCH are elevated. Hgb and Hct are WNL. We will continue to monitor.

## 2019-12-23 NOTE — Progress Notes (Signed)
Office Visit Note  Patient: Heidi Carney             Date of Birth: 28-Oct-1965           MRN: [id number]             PCP: Lavada Mesi Referring: Donella Stade, PA-C Visit Date: 01/06/2020 Occupation: @GUAROCC @  Subjective:  Medication Management .   History of Present Illness: Heidi Carney is a 54 y.o. female with history of Sjogren's and osteoarthritis.  She states she has been doing well since she has been taking Plaquenil.  She has noticed improvement in her fatigue.  She continues to have some sicca symptoms which are manageable with over-the-counter products.  She denies any increased joint pain.  Activities of Daily Living:  Patient reports morning stiffness for 5-10 minutes.   Patient Denies nocturnal pain.  Difficulty dressing/grooming: Denies Difficulty climbing stairs: Denies Difficulty getting out of chair: Denies Difficulty using hands for taps, buttons, cutlery, and/or writing: Denies  Review of Systems  Constitutional: Positive for fatigue.       Greatly improved since starting PLQ, worse with activity  HENT: Positive for mouth dryness and nose dryness. Negative for mouth sores.   Eyes: Positive for dryness. Negative for pain, itching and visual disturbance.  Respiratory: Negative for cough, hemoptysis, shortness of breath and difficulty breathing.   Cardiovascular: Negative for chest pain, palpitations and swelling in legs/feet.  Gastrointestinal: Negative for abdominal pain, blood in stool, constipation and diarrhea.  Endocrine: Negative for increased urination.  Genitourinary: Negative for difficulty urinating and painful urination.  Musculoskeletal: Positive for arthralgias, joint pain, myalgias, morning stiffness and myalgias. Negative for joint swelling, muscle weakness and muscle tenderness.  Skin: Negative for color change, rash and redness.  Allergic/Immunologic: Negative for susceptible to infections.  Neurological: Positive for  headaches. Negative for dizziness, memory loss and weakness.  Hematological: Negative for swollen glands.  Psychiatric/Behavioral: Negative for depressed mood, confusion and sleep disturbance. The patient is not nervous/anxious.     PMFS History:  Patient Active Problem List   Diagnosis Date Noted  . Sjogren's syndrome with keratoconjunctivitis sicca ([place]) 04/30/2018  . Family history of psoriatic arthritis 04/08/2018  . Family history of morphea 04/08/2018  . Allergic urticaria 02/06/2018  . Angioedema 02/06/2018  . Food intolerance 02/06/2018  . Chronic rhinitis 02/06/2018  . Increased pressure in the eye, left 01/02/2018  . Colon polyp 12/18/2017  . Grade I internal hemorrhoids 12/18/2017  . Bouchard's nodes 12/17/2017  . Joint stiffness 12/17/2017  . Primary osteoarthritis of both hands 12/17/2017  . 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 Multiple food allergies 07/12/2017  . Nausea 07/07/2017  . Intractable cyclical vomiting with nausea 07/07/2017  . Diarrhea 07/07/2017  . Abdominal cramping 07/07/2017  . Allergic reaction 07/07/2017  . Change in stool 02/12/2017  . Left lower quadrant pain 01/24/2017  . Slow transit constipation 01/24/2017  . Right ovarian cyst 01/24/2017  . Essential hypertension 01/09/2017  . Migraine headache without aura 07/10/2016  . Hair loss 11/16/2015  . RLS (restless legs syndrome) 11/16/2015  . B12 deficiency 11/16/2015  . Vitamin D insufficiency 11/16/2015  . Perimenopausal symptoms 09/13/2015  . Overweight 03/17/2015  . Seborrheic dermatitis 03/10/2015  . GAD (generalized anxiety disorder) 01/26/2015  . Elevated blood pressure reading 01/26/2015  . Thyroid activity decreased 01/26/2015    Past Medical History:  Diagnosis Date  . Anxiety   . Arthritis   . H/O total vaginal  hysterectomy 05/2017  . Hypothyroidism   . Sjogren's disease ([place])     Family History  Problem Relation Age of Onset  . Diabetes  Father   . Cancer Father        prostate CA  . Stroke Father   . Glaucoma Mother   . Asthma Sister   . Psoriasis Sister        psoriatic arthritis  . Arthritis Sister   . Scleroderma Sister   . Healthy Daughter   . Allergic rhinitis Neg Hx   . Angioedema Neg Hx   . Eczema Neg Hx   . Immunodeficiency Neg Hx   . Urticaria Neg Hx    Past Surgical History:  Procedure Laterality Date  . CESAREAN SECTION  2012  . EYE SURGERY    . LAPAROSCOPIC TOTAL HYSTERECTOMY  05/2017   Social History   Social History Narrative  . Not on file   Immunization History  Administered Date(s) Administered  . Influenza Inj Mdck Quad Pf 01/08/2019  . Influenza, Quadrivalent, Recombinant, Inj, Pf 12/21/2016, 03/15/2018  . Influenza,inj,Quad PF,6+ Mos 03/17/2015, 02/02/2016  . PFIZER SARS-COV-2 Vaccination 05/30/2019, 06/27/2019     Objective: Vital Signs: BP (!) 150/87 (BP Location: Left Arm, Patient Position: Sitting, Cuff Size: Small)   Pulse 66   Ht 5\' 4"  (1.626 m)   Wt 178 lb (80.7 kg)   LMP 01/21/2015   BMI 30.55 kg/m    Physical Exam Vitals and nursing note reviewed.  Constitutional:      Appearance: She is well-developed.  HENT:     Head: Normocephalic and atraumatic.  Eyes:     Conjunctiva/sclera: Conjunctivae normal.  Cardiovascular:     Rate and Rhythm: Normal rate and regular rhythm.     Heart sounds: Normal heart sounds.  Pulmonary:     Effort: Pulmonary effort is normal.     Breath sounds: Normal breath sounds.  Abdominal:     General: Bowel sounds are normal.     Palpations: Abdomen is soft.  Musculoskeletal:     Cervical back: Normal range of motion.  Lymphadenopathy:     Cervical: No cervical adenopathy.  Skin:    General: Skin is warm and dry.     Capillary Refill: Capillary refill takes less than 2 seconds.  Neurological:     Mental Status: She is alert and oriented to person, place, and time.  Psychiatric:        Behavior: Behavior normal.       Musculoskeletal Exam: C-spine thoracic and lumbar spine with good range of motion.  Shoulder joints, elbow joints, wrist joints with good range of motion.  She has bilateral PIP and DIP thickening.  Hip joints, knee joints, ankles, MTPs and PIPs with good range of motion with no synovitis.  CDAI Exam: CDAI Score: -- Patient Global: --; Provider Global: -- Swollen: --; Tender: -- Joint Exam 01/06/2020   No joint exam has been documented for this visit   There is currently no information documented on the homunculus. Go to the Rheumatology activity and complete the homunculus joint exam.  Investigation: No additional findings.  Imaging: No results found.  Recent Labs: Lab Results  Component Value Date   WBC 5.2 12/19/2019   HGB 13.3 12/19/2019   PLT 179 12/19/2019   NA 139 12/19/2019   K 4.2 12/19/2019   CL 103 12/19/2019   CO2 29 12/19/2019   GLUCOSE 74 12/19/2019   BUN 11 12/19/2019   CREATININE 0.95 12/19/2019  BILITOT 0.5 12/19/2019   ALKPHOS 87 02/06/2018   AST 18 12/19/2019   ALT 16 12/19/2019   PROT 7.1 12/19/2019   ALBUMIN 4.4 02/06/2018   CALCIUM 8.9 12/19/2019   GFRAA 80 12/19/2019    Speciality Comments: PLQ Eye Exam 01/01/2020, Normal @ [facility]   Procedures:  No procedures performed Allergies: Shrimp [shellfish allergy], Celexa [citalopram hydrobromide], Effexor [venlafaxine], Sulfur, Macrobid [nitrofurantoin], Sulfites, and Synthroid [levothyroxine sodium]   Assessment / Plan:     Visit Diagnoses: Sjogren's syndrome with keratoconjunctivitis sicca ([facility]) - Sicca symptoms with positive ANA and positive Ro: Her symptoms have improved since she has been taking Plaquenil.  She has noticed decreased sicca symptoms and also improvement in her fatigue.  She has been tolerating medication well.  High risk medication use - PLQ 200 mg BID M-F added at last visit on 11/10/19.  Her labs are stable.  Her eye examination was normal.  We  will get labs in 5 months.  Primary osteoarthritis of both hands-joint protection muscle strengthening was discussed.  Chronic midline low back pain without sciatica-she is currently not having much discomfort.  She had good range of motion of lumbar spine.  Vitamin D insufficiency-she is on vitamin D supplement.  Seborrheic dermatitis-topical moisturizers was discussed.  Essential hypertension-her blood pressure is elevated.  I have advised her to monitor blood pressure closely and follow-up with her PCP.  Migraine without aura and without status migrainosus, not intractable-she gets frequent headaches.  Increased pressure in the eye, left  GAD (generalized anxiety disorder)  RLS (restless legs syndrome)  Family history of psoriatic arthritis  Family history of morphea  Other fatigue  Allergic urticaria  Hx of migraines  Vitamin B12 deficiency  History of hypothyroidism  Osteoporosis screening -she is postmenopausal.  We will schedule DEXA scan to get osteoporosis screening.  Plan: DG BONE DENSITY (DXA)  Postmenopausal - Plan: DG BONE DENSITY (DXA)  Educated about COVID-19 virus infection-she has been fully vaccinated against COVID-19.  She was advised to get a booster.  Use of mask, social distancing and hand hygiene was discussed.  I also discussed use of monoclonal antibody infusion in case she develops COVID-19 infection.  Orders: Orders Placed This Encounter  Procedures  . DG BONE DENSITY (DXA)   No orders of the defined types were placed in this encounter.    Follow-Up Instructions: Return in about 3 months (around 04/07/2020) for Sjogren's, Osteoarthritis.   Bo Merino, MD  Note - This record has been created using Editor, commissioning.  Chart creation errors have been sought, but may not always  have been located. Such creation errors do not reflect on  the standard of medical care.

## 2020-01-01 ENCOUNTER — Encounter: Payer: Self-pay | Admitting: Rheumatology

## 2020-01-06 ENCOUNTER — Other Ambulatory Visit: Payer: Self-pay

## 2020-01-06 ENCOUNTER — Ambulatory Visit: Payer: BC Managed Care – PPO | Admitting: Rheumatology

## 2020-01-06 ENCOUNTER — Encounter: Payer: Self-pay | Admitting: Rheumatology

## 2020-01-06 VITALS — BP 150/87 | HR 66 | Ht 64.0 in | Wt 178.0 lb

## 2020-01-06 DIAGNOSIS — Z780 Asymptomatic menopausal state: Secondary | ICD-10-CM

## 2020-01-06 DIAGNOSIS — M545 Low back pain, unspecified: Secondary | ICD-10-CM | POA: Diagnosis not present

## 2020-01-06 DIAGNOSIS — Z13820 Encounter for screening for osteoporosis: Secondary | ICD-10-CM

## 2020-01-06 DIAGNOSIS — M3501 Sicca syndrome with keratoconjunctivitis: Secondary | ICD-10-CM | POA: Diagnosis not present

## 2020-01-06 DIAGNOSIS — G43009 Migraine without aura, not intractable, without status migrainosus: Secondary | ICD-10-CM

## 2020-01-06 DIAGNOSIS — G2581 Restless legs syndrome: Secondary | ICD-10-CM

## 2020-01-06 DIAGNOSIS — M19042 Primary osteoarthritis, left hand: Secondary | ICD-10-CM

## 2020-01-06 DIAGNOSIS — Z7189 Other specified counseling: Secondary | ICD-10-CM

## 2020-01-06 DIAGNOSIS — M19041 Primary osteoarthritis, right hand: Secondary | ICD-10-CM | POA: Diagnosis not present

## 2020-01-06 DIAGNOSIS — Z840 Family history of diseases of the skin and subcutaneous tissue: Secondary | ICD-10-CM

## 2020-01-06 DIAGNOSIS — L219 Seborrheic dermatitis, unspecified: Secondary | ICD-10-CM

## 2020-01-06 DIAGNOSIS — E559 Vitamin D deficiency, unspecified: Secondary | ICD-10-CM

## 2020-01-06 DIAGNOSIS — G8929 Other chronic pain: Secondary | ICD-10-CM

## 2020-01-06 DIAGNOSIS — Z8261 Family history of arthritis: Secondary | ICD-10-CM

## 2020-01-06 DIAGNOSIS — F411 Generalized anxiety disorder: Secondary | ICD-10-CM

## 2020-01-06 DIAGNOSIS — Z8639 Personal history of other endocrine, nutritional and metabolic disease: Secondary | ICD-10-CM

## 2020-01-06 DIAGNOSIS — Z79899 Other long term (current) drug therapy: Secondary | ICD-10-CM | POA: Diagnosis not present

## 2020-01-06 DIAGNOSIS — I10 Essential (primary) hypertension: Secondary | ICD-10-CM

## 2020-01-06 DIAGNOSIS — H40052 Ocular hypertension, left eye: Secondary | ICD-10-CM

## 2020-01-06 DIAGNOSIS — R5383 Other fatigue: Secondary | ICD-10-CM

## 2020-01-06 DIAGNOSIS — L500 Allergic urticaria: Secondary | ICD-10-CM

## 2020-01-06 DIAGNOSIS — E538 Deficiency of other specified B group vitamins: Secondary | ICD-10-CM

## 2020-01-06 DIAGNOSIS — Z8669 Personal history of other diseases of the nervous system and sense organs: Secondary | ICD-10-CM

## 2020-01-06 NOTE — Patient Instructions (Addendum)
COVID-19 vaccine recommendations:   COVID-19 vaccine is recommended for everyone (unless you are allergic to a vaccine component), even if you are on a medication that suppresses your immune system.     Do not take Tylenol or any anti-inflammatory medications (NSAIDs) 24 hours prior to the COVID-19 vaccination.   There is no direct evidence about the efficacy of the COVID-19 vaccine in individuals who are on medications that suppress the immune system.   Even if you are fully vaccinated, and you are on any medications that suppress your immune system, please continue to wear a mask, maintain at least six feet social distance and practice hand hygiene.   If you develop a COVID-19 infection, please contact your PCP or our office to determine if you need antibody infusion.  The booster vaccine is now available for immunocompromised patients. It is advised that if you had Pfizer vaccine you should get [employer] booster.  If you had a Moderna vaccine then you should get a Moderna booster. Johnson and Wynetta Emery does not have a booster vaccine at this time.  Please see the following web sites for updated information.   [email or web address]-[email or web address]  Standing Labs We placed an order today for your standing lab work.   Please have your standing labs drawn in February  If possible, please have your labs drawn 2 weeks prior to your appointment so that the provider can discuss your results at your appointment.  We have open lab daily Monday through Thursday from 8:30-12:30 PM and 1:30-4:30 PM and Friday from 8:30-12:30 PM and 1:30-4:00 PM at the office of Dr. Bo Merino, [facility].   Please be advised, patients with office appointments requiring lab work will take precedents over walk-in lab work.  If possible, please come for your lab work on Monday and Friday afternoons, as you may experience shorter wait times. The office  is located at [address], [place], [place], [place] [address] No appointment is necessary.   Labs are drawn by [facility]. Please bring your co-pay at the time of your lab draw.  You may receive a bill from [facility] for your lab work.  If you wish to have your labs drawn at another location, please call the office 24 hours in advance to send orders.  If you have any questions regarding directions or hours of operation,  please call [phone].   As a reminder, please drink plenty of water prior to coming for your lab work. Thanks!

## 2020-02-01 ENCOUNTER — Encounter: Payer: Self-pay | Admitting: Rheumatology

## 2020-02-02 MED ORDER — PLAQUENIL 200 MG PO TABS: ORAL_TABLET | ORAL | 0 refills | Status: DC

## 2020-02-02 NOTE — Telephone Encounter (Signed)
Last Visit: 01/06/2020 Next Visit: 04/06/2020 Labs: 12/19/2019 CMP WNL. RBC count is borderline low. MCV and MCH are elevated. Hgb and Hct are WNL. Eye exam: 01/01/2020, Normal  Current Dose per office note 01/06/2020: PLQ 200 mg BID M-F DX: Sjogren's syndrome with keratoconjunctivitis sicca   Okay to refill per Dr. Estanislado Pandy

## 2020-02-02 NOTE — Telephone Encounter (Signed)
Please call in a prescription for brand name Plaquenil.

## 2020-02-04 ENCOUNTER — Ambulatory Visit (INDEPENDENT_AMBULATORY_CARE_PROVIDER_SITE_OTHER): Payer: BC Managed Care – PPO | Admitting: Physician Assistant

## 2020-02-04 ENCOUNTER — Other Ambulatory Visit: Payer: Self-pay

## 2020-02-04 VITALS — BP 132/80 | HR 96 | Ht 64.0 in | Wt 173.0 lb

## 2020-02-04 DIAGNOSIS — Z1159 Encounter for screening for other viral diseases: Secondary | ICD-10-CM

## 2020-02-04 DIAGNOSIS — M9262 Juvenile osteochondrosis of tarsus, left ankle: Secondary | ICD-10-CM

## 2020-02-04 DIAGNOSIS — Z131 Encounter for screening for diabetes mellitus: Secondary | ICD-10-CM

## 2020-02-04 DIAGNOSIS — L299 Pruritus, unspecified: Secondary | ICD-10-CM

## 2020-02-04 DIAGNOSIS — F439 Reaction to severe stress, unspecified: Secondary | ICD-10-CM | POA: Diagnosis not present

## 2020-02-04 DIAGNOSIS — T783XXD Angioneurotic edema, subsequent encounter: Secondary | ICD-10-CM

## 2020-02-04 DIAGNOSIS — Z13220 Encounter for screening for lipoid disorders: Secondary | ICD-10-CM

## 2020-02-04 DIAGNOSIS — F411 Generalized anxiety disorder: Secondary | ICD-10-CM | POA: Diagnosis not present

## 2020-02-04 DIAGNOSIS — E039 Hypothyroidism, unspecified: Secondary | ICD-10-CM

## 2020-02-04 DIAGNOSIS — Z91018 Allergy to other foods: Secondary | ICD-10-CM

## 2020-02-04 MED ORDER — VENLAFAXINE HCL ER 150 MG PO CP24: 150.0000 mg | ORAL_CAPSULE | Freq: Every day | ORAL | 3 refills | Status: DC

## 2020-02-04 NOTE — Progress Notes (Signed)
Subjective:    Patient ID: Heidi Carney, female    DOB: May 14, 1965, 54 y.o.   MRN: [id number]  HPI  Pt is a 54 yo female with hypothyroidism, Sjogrens, multiple food allergies with angioedema/analphayaxis who presents to the clinic with some allergy concerns.   She had her covid booster with pfizer on 10/8. Ever since she is having allergy symptoms after taking all her medications. She is on generic plaquenil and that is different from when getting first 2 covid vaccines. She has been taking medications with banana nut muffins, clobber, fruits. She has noticed lip tinging,chest tightness, tongue swelling but resolved with benadryl. Not had to use epipen. She is concerned if new food allergies or if something with medication and covid booster that caused allergies to be out of control.   Pt c/o left heel bump. Irritates her from time to time. No trauma.   Mood is great and controlled.   .. Active Ambulatory Problems    Diagnosis Date Noted  . GAD (generalized anxiety disorder) 01/26/2015  . Elevated blood pressure reading 01/26/2015  . Thyroid activity decreased 01/26/2015  . Seborrheic dermatitis 03/10/2015  . Overweight 03/17/2015  . Perimenopausal symptoms 09/13/2015  . Hair loss 11/16/2015  . RLS (restless legs syndrome) 11/16/2015  . B12 deficiency 11/16/2015  . Vitamin D insufficiency 11/16/2015  . Migraine headache without aura 07/10/2016  . Essential hypertension 01/09/2017  . Left lower quadrant pain 01/24/2017  . Slow transit constipation 01/24/2017  . Right ovarian cyst 01/24/2017  . Change in stool 02/12/2017  . Nausea 07/07/2017  . Intractable cyclical vomiting with nausea 07/07/2017  . Diarrhea 07/07/2017  . Abdominal cramping 07/07/2017  . Allergic reaction 07/07/2017  . Multiple food allergies 07/12/2017  . Bouchard's nodes 12/17/2017  . Joint stiffness 12/17/2017  . Primary osteoarthritis of both hands 12/17/2017  . Surgical 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  replacement therapy 12/17/2017  . Colon polyp 12/18/2017  . Grade I internal hemorrhoids 12/18/2017  . Increased pressure in the eye, left 01/02/2018  . Allergic urticaria 02/06/2018  . Angioedema 02/06/2018  . Food intolerance 02/06/2018  . Chronic rhinitis 02/06/2018  . Family history of psoriatic arthritis 04/08/2018  . Family history of morphea 04/08/2018  . Sjogren's syndrome with keratoconjunctivitis sicca ([place]) 04/30/2018  . Haglund's deformity of left heel 02/06/2020  . Stress 02/06/2020  . Itching 02/06/2020   Resolved Ambulatory Problems    Diagnosis Date Noted  . Atypical chest pain 01/26/2015  . Benign paroxysmal positional vertigo 01/09/2017   Past Medical History:  Diagnosis Date  . Anxiety   . Arthritis   . H/O total vaginal hysterectomy 05/2017  . Hypothyroidism   . Sjogren's disease ([place])       Review of Systems  All other systems reviewed and are negative.      Objective:   Physical Exam Vitals reviewed.  Constitutional:      Appearance: Normal appearance. She is obese.  HENT:     Head: Normocephalic.     Right Ear: Tympanic membrane normal.     Left Ear: Tympanic membrane normal.     Nose: Nose normal.     Mouth/Throat:     Mouth: Mucous membranes are moist.  Eyes:     Conjunctiva/sclera: Conjunctivae normal.  Neck:     Vascular: No carotid bruit.  Cardiovascular:     Rate and Rhythm: Normal rate and regular rhythm.     Pulses: Normal pulses.  Pulmonary:     Effort: Pulmonary effort is  normal.     Breath sounds: Normal breath sounds.  Musculoskeletal:     Comments: Left heel non tender and not mobile firm mass of left posterior heel just below insertion of achilles tendon.  No calf pain. No redness, warmth or swelling.  NROM of left foot.   Lymphadenopathy:     Cervical: No cervical adenopathy.  Neurological:     General: No focal deficit present.     Mental Status: She is alert and oriented to person, place, and time.   Psychiatric:        Mood and Affect: Mood normal.    .. Depression screen [facility] 2/9 10/30/2018 03/15/2018 12/14/2017 07/05/2017 01/09/2017  Decreased Interest 0 1 0 0 1  Down, Depressed, Hopeless 0 2 1 0 0  PHQ - 2 Score 0 3 1 0 1  Altered sleeping 0 2 1 - 0  Tired, decreased energy 0 2 1 - 1  Change in appetite 0 2 1 - 0  Feeling bad or failure about yourself  1 1 - - 1  Trouble concentrating 0 3 0 - 0  Moving slowly or fidgety/restless 0 0 0 - 0  Suicidal thoughts 0 0 0 - 0  PHQ-9 Score $RemoveBef'1 13 4 'pEKXYhAcvn$ - 3  Difficult doing work/chores Not difficult at all Somewhat difficult Somewhat difficult - Not difficult at all   .Marland Kitchen GAD 7 : Generalized Anxiety Score 10/30/2018 03/15/2018 12/14/2017 09/13/2015  Nervous, Anxious, on Edge $Remov'1 3 2 1  'JBMLNp$ Control/stop worrying 0 0 0 0  Worry too much - different things 0 $Remove'3 2 1  'itJObkV$ Trouble relaxing 0 2 0 0  Restless 0 2 1 0  Easily annoyed or irritable $RemoveBefo'1 3 2 1  'aoOekHlSHGg$ Afraid - awful might happen 0 0 0 0  Total GAD 7 Score $Remov'2 13 7 3  'DYGDDK$ Anxiety Difficulty Not difficult at all Somewhat difficult Somewhat difficult Not difficult at all           Assessment & Plan:  Marland KitchenMarland KitchenBrittnae was seen today for allergic reaction.  Diagnoses and all orders for this visit:  Multiple food allergies -     CBC with Differential/Platelet -     Sed Rate (ESR)  Stress -     venlafaxine XR (EFFEXOR-XR) 150 MG 24 hr capsule; Take 1 capsule (150 mg total) by mouth daily with breakfast.  GAD (generalized anxiety disorder) -     venlafaxine XR (EFFEXOR-XR) 150 MG 24 hr capsule; Take 1 capsule (150 mg total) by mouth daily with breakfast.  Itching -     CBC with Differential/Platelet -     Sed Rate (ESR)  Angioedema, subsequent encounter -     CBC with Differential/Platelet -     Sed Rate (ESR)  Screening for lipid disorders -     Lipid Panel w/reflex Direct LDL  Screening for diabetes mellitus -     COMPLETE METABOLIC PANEL WITH GFR  Hypothyroidism, unspecified type -      TSH  Encounter for hepatitis C screening test for low risk patient -     Hepatitis C Antibody  Haglund's deformity of left heel   Needs labs to evaluate itching/angioedema. Ordered screening labs as well.  Will refer for more allergy testing. Concerning that this all happened close to booster covid vaccine but she was not on plaquenil with other shots.  She has been taking medications with nuts and fruits. These could be new food allergies presenting.  Start taking medications with safer allergy options  such as crackers.  Keep epipen on her. Benadryl at onset of any allergic symptoms. Keep diary of symptoms food/medications.  For now stay on medications.    Mood is doing well. Refilled effexor.    Reassurance given about heel mass. Follow up as needed or if becomes more pain send to podiatry for intervention.

## 2020-02-05 LAB — COMPLETE METABOLIC PANEL WITH GFR
AG Ratio: 1.3 (calc) (ref 1.0–2.5)
ALT: 24 U/L (ref 6–29)
AST: 21 U/L (ref 10–35)
Albumin: 4.3 g/dL (ref 3.6–5.1)
Alkaline phosphatase (APISO): 75 U/L (ref 37–153)
BUN: 10 mg/dL (ref 7–25)
CO2: 27 mmol/L (ref 20–32)
Calcium: 9.3 mg/dL (ref 8.6–10.4)
Chloride: 103 mmol/L (ref 98–110)
Creat: 0.97 mg/dL (ref 0.50–1.05)
GFR, Est African American: 78 mL/min/{1.73_m2} (ref >=60)
GFR, Est Non African American: 67 mL/min/{1.73_m2} (ref >=60)
Globulin: 3.2 g/dL (calc) (ref 1.9–3.7)
Glucose, Bld: 82 mg/dL (ref 65–99)
Potassium: 3.9 mmol/L (ref 3.5–5.3)
Sodium: 140 mmol/L (ref 135–146)
Total Bilirubin: 0.4 mg/dL (ref 0.2–1.2)
Total Protein: 7.5 g/dL (ref 6.1–8.1)

## 2020-02-05 LAB — CBC WITH DIFFERENTIAL/PLATELET
Absolute Monocytes: 451 cells/uL (ref 200–950)
Basophils Absolute: 41 cells/uL (ref 0–200)
Basophils Relative: 0.9 %
Eosinophils Absolute: 170 cells/uL (ref 15–500)
Eosinophils Relative: 3.7 %
HCT: 41 % (ref 35.0–45.0)
Hemoglobin: 14.1 g/dL (ref 11.7–15.5)
Lymphs Abs: 1587 cells/uL (ref 850–3900)
MCH: 34.9 pg — ABNORMAL HIGH (ref 27.0–33.0)
MCHC: 34.4 g/dL (ref 32.0–36.0)
MCV: 101.5 fL — ABNORMAL HIGH (ref 80.0–100.0)
MPV: 10.7 fL (ref 7.5–12.5)
Monocytes Relative: 9.8 %
Neutro Abs: 2351 cells/uL (ref 1500–7800)
Neutrophils Relative %: 51.1 %
Platelets: 183 10*3/uL (ref 140–400)
RBC: 4.04 10*6/uL (ref 3.80–5.10)
RDW: 11.6 % (ref 11.0–15.0)
Total Lymphocyte: 34.5 %
WBC: 4.6 10*3/uL (ref 3.8–10.8)

## 2020-02-05 LAB — SEDIMENTATION RATE: Sed Rate: 29 mm/h (ref 0–30)

## 2020-02-05 LAB — LIPID PANEL W/REFLEX DIRECT LDL
Cholesterol: 176 mg/dL (ref <200)
HDL: 58 mg/dL (ref >=50)
LDL Cholesterol (Calc): 95 mg/dL (calc)
Non-HDL Cholesterol (Calc): 118 mg/dL (calc) (ref <130)
Total CHOL/HDL Ratio: 3 (calc) (ref <5.0)
Triglycerides: 134 mg/dL (ref <150)

## 2020-02-05 LAB — TSH: TSH: 3.19 mIU/L

## 2020-02-06 ENCOUNTER — Encounter: Payer: Self-pay | Admitting: Physician Assistant

## 2020-02-06 DIAGNOSIS — L299 Pruritus, unspecified: Secondary | ICD-10-CM | POA: Insufficient documentation

## 2020-02-06 DIAGNOSIS — M9262 Juvenile osteochondrosis of tarsus, left ankle: Secondary | ICD-10-CM | POA: Insufficient documentation

## 2020-02-06 DIAGNOSIS — F439 Reaction to severe stress, unspecified: Secondary | ICD-10-CM | POA: Insufficient documentation

## 2020-02-06 NOTE — Progress Notes (Signed)
Pam,   Thyroid stable.  Hemoglobin normal but the size of them are big. I would like to add b12/folate/methylmalonic Acid testing, Ilan Kahrs.   Kidney, liver, glucose look great.  Cholesterol looks great.  Inflammation rate in body low.  Eosinophils normal.

## 2020-02-08 ENCOUNTER — Encounter: Payer: Self-pay | Admitting: Physician Assistant

## 2020-02-10 ENCOUNTER — Other Ambulatory Visit: Payer: Self-pay | Admitting: Physician Assistant

## 2020-02-10 DIAGNOSIS — E538 Deficiency of other specified B group vitamins: Secondary | ICD-10-CM

## 2020-02-11 ENCOUNTER — Telehealth: Payer: Self-pay | Admitting: *Deleted

## 2020-02-11 DIAGNOSIS — D7589 Other specified diseases of blood and blood-forming organs: Secondary | ICD-10-CM

## 2020-02-16 NOTE — Telephone Encounter (Signed)
Heidi Carney,   B12 looks good. Folate good.

## 2020-02-18 LAB — FOLATE: Folate: 9.9 ng/mL

## 2020-02-18 LAB — METHYLMALONIC ACID, SERUM: Methylmalonic Acid, Quant: 200 nmol/L (ref 87–318)

## 2020-02-18 LAB — VITAMIN B12: Vitamin B-12: 469 pg/mL (ref 200–1100)

## 2020-03-01 ENCOUNTER — Encounter: Payer: Self-pay | Admitting: Physician Assistant

## 2020-03-01 DIAGNOSIS — E039 Hypothyroidism, unspecified: Secondary | ICD-10-CM

## 2020-03-02 ENCOUNTER — Ambulatory Visit: Payer: BC Managed Care – PPO | Admitting: Allergy and Immunology

## 2020-03-02 MED ORDER — LEVOTHYROXINE SODIUM 75 MCG PO CAPS: ORAL_CAPSULE | ORAL | 3 refills | Status: DC

## 2020-03-03 NOTE — Progress Notes (Deleted)
New Patient Note  RE: Heidi Carney MRN: [id number] DOB: 1966/01/05 Date of Office Visit: 03/04/2020  Referring provider: Lavada Mesi Primary care provider: Lavada Mesi  Chief Complaint: No chief complaint on file.  History of Present Illness: I had the pleasure of seeing Amberly Livas for initial evaluation at the [facility] [place] [facility]  on 03/03/2020. She is a 54 y.o. female, who is referred here by Donella Stade, PA-C for the evaluation of food allergies.  She reports food allergy to ***. The reaction occurred at the age of ***, after she ate *** amount of ***. Symptoms started within *** and was in the form of *** hives, swelling, wheezing, abdominal pain, diarrhea, vomiting. ***Denies any associated cofactors such as exertion, infection, NSAID use, or alcohol consumption. The symptoms lasted for ***. She was evaluated in ED and received ***. Since this episode, she does *** not report other accidental exposures to ***. She does *** not have access to epinephrine autoinjector and *** needed to use it.   Past work up includes: immunocap which showed *** and skin prick testing which showed ***.  Dietary History: patient has been eating other foods including ***milk, ***eggs, ***peanut, ***treenuts, ***sesame, ***shellfish, ***fish, ***soy, ***wheat, ***meats, ***fruits and ***vegetables.  She reports reading labels and avoiding *** in diet completely. She tolerates ***baked egg and baked milk products.   02/04/2020 PCP visit: "Pt is a 54 yo female with hypothyroidism, Sjogrens, multiple food allergies with angioedema/analphayaxis who presents to the clinic with some allergy concerns.   She had her covid booster with pfizer on 10/8. Ever since she is having allergy symptoms after taking all her medications. She is on generic plaquenil and that is different from when getting first 2 covid vaccines. She has been taking medications with banana nut muffins,  clobber, fruits. She has noticed lip tinging,chest tightness, tongue swelling but resolved with benadryl. Not had to use epipen. She is concerned if new food allergies or if something with medication and covid booster that caused allergies to be out of control."  Assessment and Plan: Ernestine is a 54 y.o. female with: No problem-specific Assessment & Plan notes found for this encounter.  No follow-ups on file.  No orders of the defined types were placed in this encounter.  Lab Orders  No laboratory test(s) ordered today    Other allergy screening: Asthma: {Blank single:19197::"yes","no"} Rhino conjunctivitis: {Blank single:19197::"yes","no"} Food allergy: {Blank single:19197::"yes","no"} Medication allergy: {Blank single:19197::"yes","no"} Hymenoptera allergy: {Blank single:19197::"yes","no"} Urticaria: {Blank single:19197::"yes","no"} Eczema:{Blank single:19197::"yes","no"} History of recurrent infections suggestive of immunodeficency: {Blank single:19197::"yes","no"}  Diagnostics: Spirometry:  Tracings reviewed. Her effort: {Blank single:19197::"Good reproducible efforts.","It was hard to get consistent efforts and there is a question as to whether this reflects a maximal maneuver.","Poor effort, data can not be interpreted."} FVC: ***L FEV1: ***L, ***% predicted FEV1/FVC ratio: ***% Interpretation: {Blank single:19197::"Spirometry consistent with mild obstructive disease","Spirometry consistent with moderate obstructive disease","Spirometry consistent with severe obstructive disease","Spirometry consistent with possible restrictive disease","Spirometry consistent with mixed obstructive and restrictive disease","Spirometry uninterpretable due to technique","Spirometry consistent with normal pattern","No overt abnormalities noted given today's efforts"}.  Please see scanned spirometry results for details.  Skin Testing: {Blank single:19197::"Select foods","Environmental allergy  panel","Environmental allergy panel and select foods","Food allergy panel","None","Deferred due to recent antihistamines use"}. Positive test to: ***. Negative test to: ***.  Results discussed with patient/family.   Past Medical History: Patient Active Problem List   Diagnosis Date Noted  . Haglund's deformity of left heel 02/06/2020  .  Stress 02/06/2020  . Itching 02/06/2020  . Sjogren's syndrome with keratoconjunctivitis sicca ([place]) 04/30/2018  . Family history of psoriatic arthritis 04/08/2018  . Family history of morphea 04/08/2018  . Allergic urticaria 02/06/2018  . Angioedema 02/06/2018  . Food intolerance 02/06/2018  . Chronic rhinitis 02/06/2018  . Increased pressure in the eye, left 01/02/2018  . Colon polyp 12/18/2017  . Grade I internal hemorrhoids 12/18/2017  . Bouchard's nodes 12/17/2017  . Joint stiffness 12/17/2017  . Primary osteoarthritis of both hands 12/17/2017  . 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 Multiple food allergies 07/12/2017  . Nausea 07/07/2017  . Intractable cyclical vomiting with nausea 07/07/2017  . Diarrhea 07/07/2017  . Abdominal cramping 07/07/2017  . Allergic reaction 07/07/2017  . Change in stool 02/12/2017  . Left lower quadrant pain 01/24/2017  . Slow transit constipation 01/24/2017  . Right ovarian cyst 01/24/2017  . Essential hypertension 01/09/2017  . Migraine headache without aura 07/10/2016  . Hair loss 11/16/2015  . RLS (restless legs syndrome) 11/16/2015  . B12 deficiency 11/16/2015  . Vitamin D insufficiency 11/16/2015  . Perimenopausal symptoms 09/13/2015  . Overweight 03/17/2015  . Seborrheic dermatitis 03/10/2015  . GAD (generalized anxiety disorder) 01/26/2015  . Elevated blood pressure reading 01/26/2015  . Thyroid activity decreased 01/26/2015   Past Medical History:  Diagnosis Date  . Anxiety   . Arthritis   . H/O total vaginal hysterectomy 05/2017  . Hypothyroidism   . Sjogren's  disease [facility])    Past Surgical History: Past Surgical History:  Procedure Laterality Date  . CESAREAN SECTION  2012  . EYE SURGERY    . LAPAROSCOPIC TOTAL HYSTERECTOMY  05/2017   Medication List:  Current Outpatient Medications  Medication Sig Dispense Refill  . cyanocobalamin (,VITAMIN B-12,) 1000 MCG/ML injection TAKE 1ML ONCE A MONTH. 3 mL 1  . diclofenac sodium (VOLTAREN) 1 % GEL Apply 4 g topically 4 (four) times daily. To affected joint. 100 g 1  . EPINEPHrine (AUVI-Q) 0.3 mg/0.3 mL IJ SOAJ injection Use as directed for severe allergic reactions. 4 Device 1  . levocetirizine (XYZAL) 5 MG tablet Take 1 tablet (5 mg total) by mouth at bedtime. 90 tablet 3  . Levothyroxine Sodium (TIROSINT) 75 MCG CAPS TAKE 1 CAPSULE (75 MCG TOTAL) BY MOUTH DAILY BEFORE BREAKFAST. 90 capsule 3  . lidocaine (XYLOCAINE) 2 % jelly lidocaine HCl 2 % mucosal jelly  Take 1 mg every 4 hours by mucous route as needed.    . nystatin-triamcinolone (MYCOLOG II) cream Apply 1 application topically as needed.     Marland Kitchen PLAQUENIL 200 MG tablet Take 1 tablet 200 mg BID Monday-Friday 120 tablet 0  . triamcinolone ointment (KENALOG) 0.5 % APPLY TOPICALLY AS NEEDED    . venlafaxine XR (EFFEXOR-XR) 150 MG 24 hr capsule Take 1 capsule (150 mg total) by mouth daily with breakfast. 90 capsule 3  . VITAMIN D PO Take 1,000 mg by mouth daily.     No current facility-administered medications for this visit.   Allergies: Allergies  Allergen Reactions  . Shrimp [Shellfish Allergy]     Lip and tongue swelling.   Sheliah Hatch [Citalopram Hydrobromide]     Made feel anxiety worse  . Effexor [Venlafaxine]     Blurry vision  . Sulfur     Possible reaction with lip tingling/SOB/itching  . Macrobid [Nitrofurantoin]     Bloated/itchy like a sulfite reaction  . Sulfites   . Synthroid [Levothyroxine Sodium]  Facial itching   Social History: Social History   Socioeconomic History  . Marital status: Married    Spouse name:  Not on file  . Number of children: Not on file  . Years of education: Not on file  . Highest education level: Not on file  Occupational History  . Not on file  Tobacco Use  . Smoking status: Never Smoker  . Smokeless tobacco: Never Used  Vaping Use  . Vaping Use: Never used  Substance and Sexual Activity  . Alcohol use: Not Currently  . Drug use: Never  . Sexual activity: Yes  Other Topics Concern  . Not on file  Social History Narrative  . Not on file   Social Determinants of Health   Financial Resource Strain:   . Difficulty of Paying Living Expenses: Not on file  Food Insecurity:   . Worried About Charity fundraiser in the Last Year: Not on file  . Ran Out of Food in the Last Year: Not on file  Transportation Needs:   . Lack of Transportation (Medical): Not on file  . Lack of Transportation (Non-Medical): Not on file  Physical Activity:   . Days of Exercise per Week: Not on file  . Minutes of Exercise per Session: Not on file  Stress:   . Feeling of Stress : Not on file  Social Connections:   . Frequency of Communication with Friends and Family: Not on file  . Frequency of Social Gatherings with Friends and Family: Not on file  . Attends Religious Services: Not on file  . Active Member of Clubs or Organizations: Not on file  . Attends Archivist Meetings: Not on file  . Marital Status: Not on file   Lives in a ***. Smoking: *** Occupation: ***  Environmental HistoryFreight forwarder in the house: Estate agent in the family room: {Blank single:19197::"yes","no"} Carpet in the bedroom: {Blank single:19197::"yes","no"} Heating: {Blank single:19197::"electric","gas"} Cooling: {Blank single:19197::"central","window"} Pet: {Blank single:19197::"yes ***","no"}  Family History: Family History  Problem Relation Age of Onset  . Diabetes Father   . Cancer Father        prostate CA  . Stroke Father   . Glaucoma Mother   .  Asthma Sister   . Psoriasis Sister        psoriatic arthritis  . Arthritis Sister   . Scleroderma Sister   . Healthy Daughter   . Allergic rhinitis Neg Hx   . Angioedema Neg Hx   . Eczema Neg Hx   . Immunodeficiency Neg Hx   . Urticaria Neg Hx    Problem                               Relation Asthma                                   *** Eczema                                *** Food allergy                          *** Allergic rhino conjunctivitis     ***  Review of Systems  Constitutional: Negative for appetite change, chills, fever and unexpected weight change.  HENT: Negative for congestion and rhinorrhea.   Eyes: Negative for itching.  Respiratory: Negative for cough, chest tightness, shortness of breath and wheezing.   Cardiovascular: Negative for chest pain.  Gastrointestinal: Negative for abdominal pain.  Genitourinary: Negative for difficulty urinating.  Skin: Negative for rash.  Neurological: Negative for headaches.   Objective: LMP 01/21/2015  There is no height or weight on file to calculate BMI. Physical Exam Vitals and nursing note reviewed.  Constitutional:      Appearance: Normal appearance. She is well-developed.  HENT:     Head: Normocephalic and atraumatic.     Right Ear: External ear normal.     Left Ear: External ear normal.     Nose: Nose normal.     Mouth/Throat:     Mouth: Mucous membranes are moist.     Pharynx: Oropharynx is clear.  Eyes:     Conjunctiva/sclera: Conjunctivae normal.  Cardiovascular:     Rate and Rhythm: Normal rate and regular rhythm.     Heart sounds: Normal heart sounds. No murmur heard.  No friction rub. No gallop.   Pulmonary:     Effort: Pulmonary effort is normal.     Breath sounds: Normal breath sounds. No wheezing, rhonchi or rales.  Abdominal:     Palpations: Abdomen is soft.  Musculoskeletal:     Cervical back: Neck supple.  Skin:    General: Skin is warm.     Findings: No rash.  Neurological:     Mental  Status: She is alert and oriented to person, place, and time.  Psychiatric:        Behavior: Behavior normal.    The plan was reviewed with the patient/family, and all questions/concerned were addressed.  It was my pleasure to see Najia today and participate in her care. Please feel free to contact me with any questions or concerns.  Sincerely,  Rexene Alberts, DO Allergy & Immunology  [facility] office: [place] office: [phone]

## 2020-03-04 ENCOUNTER — Ambulatory Visit: Payer: BC Managed Care – PPO | Admitting: Allergy

## 2020-03-10 ENCOUNTER — Encounter: Payer: Self-pay | Admitting: Rheumatology

## 2020-03-10 ENCOUNTER — Encounter: Payer: Self-pay | Admitting: Physician Assistant

## 2020-03-23 NOTE — Progress Notes (Signed)
Office Visit Note  Patient: Heidi Carney             Date of Birth: May 29, 1965           MRN: [id number]             PCP: Lavada Mesi Referring: Lavada Mesi Visit Date: 04/06/2020 Occupation: @GUAROCC @  Subjective:  Pain in both hands   History of Present Illness: Heidi Carney is a 54 y.o. female with history of Sjogren's syndrome and osteoarthritis.  Patient is currently taking Plaquenil 200 mg 1 tablet by mouth daily.  She was initially started on the generic hydroxychloroquine 200 mg 1 tablet by mouth twice daily Monday through Friday but she developed generalized skin itching and irritation.  She had a short gap in therapy and her symptoms improved.  She has noticed improvement since switching from the generic to brand of Plaquenil.  She plans on further increasing the dose to twice daily Monday through Friday as tolerated.  Overall she has noted significant clinical improvement since starting on Plaquenil.  She continues to experience intermittent pain and stiffness in both hands but has not noticed any joint swelling.  She reports that she has noticed a nodule on the posterior aspect of both heels which have progressively been worsening over time.  She has noticed an increase tenderness with certain shoes she wears.  She states that overall her sicca symptoms have been tolerable.  She continues to use lubricating gel eyedrops as well as Biotene products for symptomatic relief.  She continues to see the dentist every 6 months and denies any recent dental caries.  She is also followed closely by her ophthalmologist.    Activities of Daily Living:  Patient reports morning stiffness for a few minutes.   Patient Reports nocturnal pain.  Difficulty dressing/grooming: Denies Difficulty climbing stairs: Denies Difficulty getting out of chair: Denies Difficulty using hands for taps, buttons, cutlery, and/or writing: Denies  Review of Systems  Constitutional:  Positive for fatigue.  HENT: Positive for mouth dryness. Negative for mouth sores and nose dryness.   Eyes: Positive for dryness. Negative for pain and itching.  Respiratory: Negative for shortness of breath and difficulty breathing.   Cardiovascular: Negative for chest pain and palpitations.  Gastrointestinal: Positive for constipation. Negative for blood in stool and diarrhea.  Endocrine: Negative for increased urination.  Genitourinary: Negative for difficulty urinating.  Musculoskeletal: Positive for arthralgias, joint pain, myalgias, morning stiffness and myalgias. Negative for joint swelling and muscle tenderness.  Skin: Negative for color change, rash and redness.  Allergic/Immunologic: Negative for susceptible to infections.  Neurological: Negative for dizziness, numbness, headaches, memory loss and weakness.  Hematological: Negative for bruising/bleeding tendency.  Psychiatric/Behavioral: Negative for confusion.    PMFS History:  Patient Active Problem List   Diagnosis Date Noted  . Adverse reaction to food, subsequent encounter 03/30/2020  . Haglund's deformity of left heel 02/06/2020  . Stress 02/06/2020  . Itching 02/06/2020  . Sjogren's syndrome with keratoconjunctivitis sicca ([place]) 04/30/2018  . Family history of psoriatic arthritis 04/08/2018  . Family history of morphea 04/08/2018  . Allergic urticaria 02/06/2018  . Angioedema 02/06/2018  . Food intolerance 02/06/2018  . Chronic rhinitis 02/06/2018  . Increased pressure in the eye, left 01/02/2018  . Colon polyp 12/18/2017  . Grade I internal hemorrhoids 12/18/2017  . Bouchard's nodes 12/17/2017  . Joint stiffness 12/17/2017  . Primary osteoarthritis of both hands 12/17/2017  . Surgical 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 replacement  therapy 12/17/2017  . Multiple food allergies 07/12/2017  . Nausea 07/07/2017  . Intractable cyclical vomiting with nausea 07/07/2017  . Diarrhea 07/07/2017  . Abdominal cramping 07/07/2017  .  Allergic reaction 07/07/2017  . Change in stool 02/12/2017  . Left lower quadrant pain 01/24/2017  . Slow transit constipation 01/24/2017  . Right ovarian cyst 01/24/2017  . Essential hypertension 01/09/2017  . Migraine headache without aura 07/10/2016  . Hair loss 11/16/2015  . RLS (restless legs syndrome) 11/16/2015  . B12 deficiency 11/16/2015  . Vitamin D insufficiency 11/16/2015  . Perimenopausal symptoms 09/13/2015  . Overweight 03/17/2015  . Seborrheic dermatitis 03/10/2015  . GAD (generalized anxiety disorder) 01/26/2015  . Elevated blood pressure reading 01/26/2015  . Thyroid activity decreased 01/26/2015    Past Medical History:  Diagnosis Date  . Anxiety   . Arthritis   . H/O total vaginal hysterectomy 05/2017  . Hypothyroidism   . Sjogren's disease ([place])     Family History  Problem Relation Age of Onset  . Diabetes Father   . Cancer Father        prostate CA  . Stroke Father   . Glaucoma Mother   . Asthma Sister   . Psoriasis Sister        psoriatic arthritis  . Arthritis Sister   . Scleroderma Sister   . Eczema Sister   . Asthma Sister   . Healthy Daughter   . Angioedema Neg Hx   . Immunodeficiency Neg Hx   . Urticaria Neg Hx    Past Surgical History:  Procedure Laterality Date  . CESAREAN SECTION  2012  . EYE SURGERY    . LAPAROSCOPIC TOTAL HYSTERECTOMY  05/2017   Social History   Social History Narrative  . Not on file   Immunization History  Administered Date(s) Administered  . Influenza Inj Mdck Quad Pf 01/08/2019  . Influenza, Quadrivalent, Recombinant, Inj, Pf 12/21/2016, 03/15/2018  . Influenza,inj,Quad PF,6+ Mos 03/17/2015, 02/02/2016  . PFIZER SARS-COV-2 Vaccination 05/30/2019, 06/27/2019, 01/09/2020     Objective: Vital Signs: BP (!) 143/84 (BP Location: Left Arm, Patient Position: Sitting, Cuff Size: Normal)   Pulse 92   Resp 15   Ht 5\' 4"  (1.626 m)   Wt 169 lb 9.6 oz (76.9 kg)   LMP 01/21/2015   BMI 29.11 kg/m     Physical Exam Vitals and nursing note reviewed.  Constitutional:      Appearance: She is well-developed and well-nourished.  HENT:     Head: Normocephalic and atraumatic.  Eyes:     Extraocular Movements: EOM normal.     Conjunctiva/sclera: Conjunctivae normal.  Cardiovascular:     Pulses: Intact distal pulses.  Pulmonary:     Effort: Pulmonary effort is normal.  Abdominal:     Palpations: Abdomen is soft.  Musculoskeletal:     Cervical back: Normal range of motion.  Skin:    General: Skin is warm and dry.     Capillary Refill: Capillary refill takes less than 2 seconds.  Neurological:     Mental Status: She is alert and oriented to person, place, and time.  Psychiatric:        Mood and Affect: Mood and affect normal.        Behavior: Behavior normal.      Musculoskeletal Exam: C-spine, thoracic spine, and lumbar spine good ROM.  Shoulder joints, elbow joints, wrist joints, MCPs, PIPs, and DIPs good ROM with no synovitis.  PIP and DIP thickening consistent with osteoarthritis of  both hands.  Right middle trigger finger.  Complete fist formation bilaterally.  Hip joints, knee joints, and ankle joints good ROM with no discomfort.  No warmth or effusion of knee joints.  No tenderness or swelling of ankle joints.  No tenderness over trochanteric bursa bilaterally.  Posterior calcaneal spurring palpable bilaterally.  No tenderness of MTPs. CDAI Exam: CDAI Score: -- Patient Global: --; Provider Global: -- Swollen: --; Tender: -- Joint Exam 04/06/2020   No joint exam has been documented for this visit   There is currently no information documented on the homunculus. Go to the Rheumatology activity and complete the homunculus joint exam.  Investigation: No additional findings.  Imaging: XR Foot 2 Views Left  Result Date: 04/06/2020 First MTP narrowing, PIP and DIP narrowing was noted.  No intertarsal, tibiotalar or subtalar joint space narrowing was noted.  Small inferior and  posterior calcaneal spurs were noted.  No erosive changes were noted. Impression: These findings are consistent with osteoarthritis of the foot.  XR Foot 2 Views Right  Result Date: 04/06/2020 First MTP narrowing, PIP and DIP narrowing was noted.  No intertarsal, tibiotalar or subtalar joint space narrowing was noted.  Inferior and posterior calcaneal spurs were noted.  No erosive changes were noted. Impression: These findings are consistent with osteoarthritis of the foot.   Recent Labs: Lab Results  Component Value Date   WBC 4.6 02/04/2020   HGB 14.1 02/04/2020   PLT 183 02/04/2020   NA 140 02/04/2020   K 3.9 02/04/2020   CL 103 02/04/2020   CO2 27 02/04/2020   GLUCOSE 82 02/04/2020   BUN 10 02/04/2020   CREATININE 0.97 02/04/2020   BILITOT 0.4 02/04/2020   ALKPHOS 87 02/06/2018   AST 21 02/04/2020   ALT 24 02/04/2020   PROT 7.5 02/04/2020   ALBUMIN 4.4 02/06/2018   CALCIUM 9.3 02/04/2020   GFRAA 78 02/04/2020    Speciality Comments: PLQ Eye Exam 01/01/2020, Normal @ [facility]   Procedures:  No procedures performed Allergies: Shrimp [shellfish allergy], Celexa [citalopram hydrobromide], Effexor [venlafaxine], Sulfur, Macrobid [nitrofurantoin], Sulfites, and Synthroid [levothyroxine sodium]   Assessment / Plan:     Visit Diagnoses: Sjogren's syndrome with keratoconjunctivitis sicca (HCC) - Sicca symptoms with positive ANA and positive Ro: Overall she has noticed clinical improvement since starting on Plaquenil.  She was initially started on hydroxycholoquine 200 mg 1 tablet by mouth twice daily Monday through Friday after her office visit on 11/10/2019.  She was experiencing skin itching and irritation and discontinued hydroxychloroquine (generic).  She has since restarted Plaquenil (brand name) and has been increasing the dose as tolerated and is currently taking 200 mg 1 tablet by mouth daily without any side effects.  She has noticed an improvement  in her hand pain and inflammation since starting on Plaquenil.  Her sicca symptoms have been tolerable overall.  We discussed the importance of regular dental exams every 6 months as well as following closely with her ophthalmologist.  We discussed the use of over-the-counter products.  She plans on continuing to use Biotene products as well as gel lubricating eyedrops at night.  She will continue taking Plaquenil as tolerated until she is able to reach the dose of 200 mg 1 tablet by mouth twice daily Monday through Friday.  She was advised to notify us if she develops any new or worsening symptoms.  She will follow-up in the office in 3 months.   High risk medication use - She is currently  taking plaquenil 200 mg 1 tablet by mouth daily due to experiencing skin itching and irritation.  PLQ started on 11/10/19.  PLQ Eye Exam 01/01/2020, Normal @ [place].  CBC and CMP were drawn on 02/04/2020 and were reviewed with the patient today in the office.  She will continue to require lab work every 5 months to monitor for drug toxicity.  Primary osteoarthritis of both hands: She has PIP and DIP thickening consistent with osteoarthritis of both hands.  No tenderness or synovitis was noted on exam.  She continues to experience stiffness and discomfort in both hands intermittently but overall her discomfort has started to improve since starting on Plaquenil.  She uses Voltaren gel topically as needed for pain relief.  We discussed the importance of joint protection and muscle strengthening.  We also discussed the use of arthritis compression gloves.  She was also given a list of natural anti-inflammatories which she can start to take as tolerated.   Vitamin D insufficiency: Vitamin D 43 on 11/10/2019.  Pain in both feet - She has been experiencing increased discomfort on the posterior aspect of both heels which has progressively been worsening over the past several months.  Posterior  calcaneal spurs palpable bilaterally.  X-rays of both feet were obtained today which were consistent with osteoarthritic changes in both feel and posterior calcaneal spurs noted, R>L.  Her discomfort has been progressively worsening and is aggravated by wearing shoes with a heel.  [facility] today for further evaluation and management.  Plan: XR Foot 2 Views Right, XR Foot 2 Views Left  Other medical conditions are listed as follows:   Seborrheic dermatitis  Essential hypertension  Migraine without aura and without status migrainosus, not intractable  Increased pressure in the eye, left  GAD (generalized anxiety disorder)  RLS (restless legs syndrome)  Family history of psoriatic arthritis  Family history of morphea  Other fatigue  Allergic urticaria  Hx of migraines  Vitamin B12 deficiency  History of hypothyroidism    Orders: Orders Placed This Encounter  Procedures  . XR Foot 2 Views Right  . XR Foot 2 Views Left  . Ambulatory referral to Podiatry   No orders of the defined types were placed in this encounter.     Follow-Up Instructions: Return in 3 months (on 07/05/2020) for Sjogren's syndrome.   Ofilia Neas, PA-C  Note - This record has been created using Dragon software.  Chart creation errors have been sought, but may not always  have been located. Such creation errors do not reflect on  the standard of medical care.

## 2020-03-30 ENCOUNTER — Other Ambulatory Visit: Payer: Self-pay

## 2020-03-30 ENCOUNTER — Encounter: Payer: Self-pay | Admitting: Allergy

## 2020-03-30 ENCOUNTER — Ambulatory Visit (INDEPENDENT_AMBULATORY_CARE_PROVIDER_SITE_OTHER): Payer: BC Managed Care – PPO | Admitting: Allergy

## 2020-03-30 VITALS — BP 152/80 | HR 80 | Temp 97.2°F | Resp 16 | Ht 65.2 in | Wt 166.0 lb

## 2020-03-30 DIAGNOSIS — T781XXD Other adverse food reactions, not elsewhere classified, subsequent encounter: Secondary | ICD-10-CM

## 2020-03-30 DIAGNOSIS — T7840XD Allergy, unspecified, subsequent encounter: Secondary | ICD-10-CM

## 2020-03-30 MED ORDER — EPINEPHRINE 0.3 MG/0.3ML IJ SOAJ
INTRAMUSCULAR | 1 refills | Status: DC
Start: 2020-03-30 — End: 2024-01-30

## 2020-03-30 NOTE — Progress Notes (Signed)
Follow Up Note  RE: Heidi Carney MRN: [id number] DOB: 1965-12-06 Date of Office Visit: 03/30/2020  Referring provider: Lavada Mesi Primary care provider: Donella Stade, PA-C  Chief Complaint: Allergic Rhinitis  (Sulfate preservative allergy maybe. )  History of Present Illness: I had the pleasure of seeing Heidi Carney for a follow up visit at the [facility] on 03/30/2020. She is a 54 y.o. female, who is being followed for allergic reaction. Her previous allergy office visit was on 03/12/2018 with Dr. Verlin Carney. Today is a regular follow up visit. She is accompanied today by her sister who provided/contributed to the history.   Patient started taking generic Plaquenil 3 months ago which caused some intense itching.  During this time she also had her COVID-19 vaccine booster which caused chest tightness, anxiety and a sense of doom. She took benadryl which helped. No issues with her first 2 COVID-19 vaccines.  Also since the booster, she noticed increased symptoms and concerned about food allergies.  She thinks she is sensitive sulfite and has been trying to avoid it.  She seems to wake up around 2AM with facial puffiness, some erythema of the arms, "vein popping out", migraines/headaches and restless legs.   She had a banana nut muffin and took her Plaquenil which caused some chest tightness and took some benadryl which helped. The last 2-3 nights patient had "reactions". She had shredded wheat, bananas, roast.  Yesterday she had oatmeal, [place], gravy, rice, homemade apple pie and at night noted some hand swelling, vein popping.  The night before she had mac & cheese, [place], fruit, homemade apples and had similar reactions as above.  Usually these symptoms occur about 2-4 hours after her meals.  No specific food triggers - concerned about possibly flour allergy but not sure.   Past work up includes: 2019 skin prick testing which showed  negative to food panel.   Dietary History: patient has been eating other foods including milk, baked eggs, peanut, treenuts, sesame, shellfish, fish, soy, wheat, meats, fruits and vegetables.  She reports reading labels and avoiding sulfites in diet completely. She tolerates baked egg and baked milk products.   Patient initially had more of vomiting and diarrhea episodes with the sulfite exposures.   Patient was diagnosed Sjogren's and is taking Plaquenil for this.  Assessment and Plan: [place] is a 54 y.o. female with: Adverse reaction to food, subsequent encounter Patient concerned if she developed any new allergies. 3 months ago after she started on generic Plaquenil and got her COVID-19 booster she noticed symptoms of facial puffiness, erythema of the arms, vein popping out, migraines and restless legs around 2AM. This is usually about 2-4 hours after her last meal. No specific food triggers noted but concerned about wheat possibly. She has been avoiding sulfite containing foods which has helped.   Discussed with patient at length that some of her above symptoms do not seem to be IgE mediated and I'm not convinced it has anything to do with what she is eating given her timeline of events.   There is no skin testing or bloodwork available for sulfites. She brought sulfite in powder form and offered a drug challenge in the office with it if interested but patient knows sulfite containing foods cause symptoms so she is just going to avoid them.  There is no indication to do any additional testing today. 2019 skin testing was negative to large food panel.  It seems like her symptoms  flared after stopping benadryl.  Try taking a longer acting antihistamine daily other than benadryl - try taking 1/2 the dose due to Sjorgen's as antihistamines can cause drying of the mucosa.   May use over the counter antihistamines such as Zyrtec (cetirizine), Claritin (loratadine), Allegra (fexofenadine), or  Xyzal (levocetirizine) daily as needed.  Continue to avoid foods that seem to flare symptoms including the sulfite containing foods.  I have prescribed epinephrine injectable and demonstrated proper use. For mild symptoms you can take over the counter antihistamines such as Benadryl and monitor symptoms closely. If symptoms worsen or if you have severe symptoms including breathing issues, throat closure, significant swelling, whole body hives, severe diarrhea and vomiting, lightheadedness then inject epinephrine and seek immediate medical care afterwards.  Emergency action plan given  Get bloodwork - tryptase level within 2-3 hours of an allergic reaction to confirm it was indeed an allergic reaction.  Keep track of symptoms and what you had come across that day. No testing available for non-IgE mediated food sensitivities/intolerances.  Follow up with rheumatology regarding Plaquenil - as it seems to make her itch.   Return if symptoms worsen or fail to improve.  Meds ordered this encounter  Medications  . EPINEPHrine (AUVI-Q) 0.3 mg/0.3 mL IJ SOAJ injection    Sig: Use as directed for severe allergic reactions.    Dispense:  2 each    Refill:  1    Lab Orders     Tryptase  Diagnostics: None.  Medication List:  Current Outpatient Medications  Medication Sig Dispense Refill  . cyanocobalamin (,VITAMIN B-12,) 1000 MCG/ML injection TAKE 1ML ONCE A MONTH. 3 mL 1  . diclofenac sodium (VOLTAREN) 1 % GEL Apply 4 g topically 4 (four) times daily. To affected joint. 100 g 1  . EPINEPHrine (AUVI-Q) 0.3 mg/0.3 mL IJ SOAJ injection Use as directed for severe allergic reactions. 2 each 1  . levocetirizine (XYZAL) 5 MG tablet Take 1 tablet (5 mg total) by mouth at bedtime. 90 tablet 3  . Levothyroxine Sodium (TIROSINT) 75 MCG CAPS TAKE 1 CAPSULE (75 MCG TOTAL) BY MOUTH DAILY BEFORE BREAKFAST. 90 capsule 3  . venlafaxine XR (EFFEXOR-XR) 150 MG 24 hr capsule Take 1 capsule (150 mg total) by  mouth daily with breakfast. 90 capsule 3  . PLAQUENIL 200 MG tablet Take 1 tablet 200 mg BID Monday-Friday 120 tablet 0   No current facility-administered medications for this visit.   Allergies: Allergies  Allergen Reactions  . Shrimp [Shellfish Allergy]     Lip and tongue swelling.   Sheliah Hatch [Citalopram Hydrobromide]     Made feel anxiety worse  . Effexor [Venlafaxine]     Blurry vision  . Sulfur     Possible reaction with lip tingling/SOB/itching  . Macrobid [Nitrofurantoin]     Bloated/itchy like a sulfite reaction  . Sulfites   . Synthroid [Levothyroxine Sodium]     Facial itching   I reviewed her past medical history, social history, family history, and environmental history and no significant changes have been reported from her previous visit.  Review of Systems  Constitutional: Negative for appetite change, chills, fever and unexpected weight change.  HENT: Negative for congestion and rhinorrhea.   Eyes: Negative for itching.  Respiratory: Negative for cough, chest tightness, shortness of breath and wheezing.   Gastrointestinal: Positive for constipation. Negative for abdominal pain.  Skin: Negative for rash.  Neurological: Positive for headaches.   Objective: BP (!) 152/80 (BP Location: Right Arm, Patient  Position: Sitting, Cuff Size: Normal)   Pulse 80   Temp (!) 97.2 F (36.2 C) (Temporal)   Resp 16   Ht 5' 5.2" (1.656 m)   Wt 166 lb (75.3 kg)   LMP 01/21/2015   SpO2 96%   BMI 27.46 kg/m  Body mass index is 27.46 kg/m. Physical Exam Vitals and nursing note reviewed.  Constitutional:      Appearance: Normal appearance. She is well-developed.  HENT:     Head: Normocephalic and atraumatic.     Right Ear: External ear normal.     Left Ear: External ear normal.     Nose: Nose normal.     Mouth/Throat:     Mouth: Mucous membranes are moist.     Pharynx: Oropharynx is clear.  Eyes:     Conjunctiva/sclera: Conjunctivae normal.  Cardiovascular:      Rate and Rhythm: Normal rate and regular rhythm.     Heart sounds: Normal heart sounds. No murmur heard.   Pulmonary:     Effort: Pulmonary effort is normal.     Breath sounds: Normal breath sounds. No wheezing, rhonchi or rales.  Musculoskeletal:     Cervical back: Neck supple.  Skin:    General: Skin is warm.     Findings: No rash.  Neurological:     Mental Status: She is alert and oriented to person, place, and time.  Psychiatric:        Behavior: Behavior normal.    Previous notes and tests were reviewed. The plan was reviewed with the patient/family, and all questions/concerned were addressed.  It was my pleasure to see Keshonna today and participate in her care. Please feel free to contact me with any questions or concerns.  Sincerely,  Rexene Alberts, DO Allergy & Immunology  [facility] office: [place] office: [phone]

## 2020-03-30 NOTE — Patient Instructions (Addendum)
Allergic reactions?  Some of your symptoms do not seem to be allergic in nature.   Try taking a longer acting antihistamine daily other than benadryl - try taking 1/2 the dose due to your Sjorgen's.   May use over the counter antihistamines such as Zyrtec (cetirizine), Claritin (loratadine), Allegra (fexofenadine), or Xyzal (levocetirizine) daily as needed.   Continue to avoid foods that seem to make your symptoms worse including the sulfite containing foods.  I have prescribed epinephrine injectable and demonstrated proper use. For mild symptoms you can take over the counter antihistamines such as Benadryl and monitor symptoms closely. If symptoms worsen or if you have severe symptoms including breathing issues, throat closure, significant swelling, whole body hives, severe diarrhea and vomiting, lightheadedness then inject epinephrine and seek immediate medical care afterwards.  Emergency action plan given  Get bloodwork - tryptase level within 2-3 hours of an allergic reaction to confirm it was indeed an allergic reaction.  Keep track of symptoms and what you had come across that day. No testing available for food sensitivities/intolerances.   Hypertension  Follow up with PCP.  Sjogren's  Follow up with rheumatologist as scheduled.  Follow up as needed.

## 2020-03-30 NOTE — Assessment & Plan Note (Signed)
Patient concerned if she developed any new allergies. 3 months ago after she started on generic Plaquenil and got her COVID-19 booster she noticed symptoms of facial puffiness, erythema of the arms, vein popping out, migraines and restless legs around 2AM. This is usually about 2-4 hours after her last meal. No specific food triggers noted but concerned about wheat possibly. She has been avoiding sulfite containing foods which has helped.   Discussed with patient at length that some of her above symptoms do not seem to be IgE mediated and I'm not convinced it has anything to do with what she is eating given her timeline of events.   There is no skin testing or bloodwork available for sulfites. She brought sulfite in powder form and offered a drug challenge in the office with it if interested but patient knows sulfite containing foods cause symptoms so she is just going to avoid them.  There is no indication to do any additional testing today. 2019 skin testing was negative to large food panel.  It seems like her symptoms flared after stopping benadryl.  Try taking a longer acting antihistamine daily other than benadryl - try taking 1/2 the dose due to Sjorgen's as antihistamines can cause drying of the mucosa.   May use over the counter antihistamines such as Zyrtec (cetirizine), Claritin (loratadine), Allegra (fexofenadine), or Xyzal (levocetirizine) daily as needed.  Continue to avoid foods that seem to flare symptoms including the sulfite containing foods.  I have prescribed epinephrine injectable and demonstrated proper use. For mild symptoms you can take over the counter antihistamines such as Benadryl and monitor symptoms closely. If symptoms worsen or if you have severe symptoms including breathing issues, throat closure, significant swelling, whole body hives, severe diarrhea and vomiting, lightheadedness then inject epinephrine and seek immediate medical care afterwards.  Emergency action  plan given  Get bloodwork - tryptase level within 2-3 hours of an allergic reaction to confirm it was indeed an allergic reaction.  Keep track of symptoms and what you had come across that day. No testing available for non-IgE mediated food sensitivities/intolerances.  Follow up with rheumatology regarding Plaquenil - as it seems to make her itch.

## 2020-04-06 ENCOUNTER — Encounter: Payer: Self-pay | Admitting: Physician Assistant

## 2020-04-06 ENCOUNTER — Other Ambulatory Visit: Payer: Self-pay

## 2020-04-06 ENCOUNTER — Ambulatory Visit: Payer: Self-pay

## 2020-04-06 ENCOUNTER — Ambulatory Visit: Payer: BC Managed Care – PPO | Admitting: Physician Assistant

## 2020-04-06 VITALS — BP 143/84 | HR 92 | Resp 15 | Ht 64.0 in | Wt 169.6 lb

## 2020-04-06 DIAGNOSIS — F411 Generalized anxiety disorder: Secondary | ICD-10-CM

## 2020-04-06 DIAGNOSIS — Z8261 Family history of arthritis: Secondary | ICD-10-CM

## 2020-04-06 DIAGNOSIS — E559 Vitamin D deficiency, unspecified: Secondary | ICD-10-CM

## 2020-04-06 DIAGNOSIS — E538 Deficiency of other specified B group vitamins: Secondary | ICD-10-CM

## 2020-04-06 DIAGNOSIS — Z79899 Other long term (current) drug therapy: Secondary | ICD-10-CM | POA: Diagnosis not present

## 2020-04-06 DIAGNOSIS — M79672 Pain in left foot: Secondary | ICD-10-CM | POA: Diagnosis not present

## 2020-04-06 DIAGNOSIS — M19041 Primary osteoarthritis, right hand: Secondary | ICD-10-CM | POA: Diagnosis not present

## 2020-04-06 DIAGNOSIS — G43009 Migraine without aura, not intractable, without status migrainosus: Secondary | ICD-10-CM

## 2020-04-06 DIAGNOSIS — M7731 Calcaneal spur, right foot: Secondary | ICD-10-CM

## 2020-04-06 DIAGNOSIS — M79671 Pain in right foot: Secondary | ICD-10-CM

## 2020-04-06 DIAGNOSIS — Z8669 Personal history of other diseases of the nervous system and sense organs: Secondary | ICD-10-CM

## 2020-04-06 DIAGNOSIS — Z840 Family history of diseases of the skin and subcutaneous tissue: Secondary | ICD-10-CM

## 2020-04-06 DIAGNOSIS — H40052 Ocular hypertension, left eye: Secondary | ICD-10-CM

## 2020-04-06 DIAGNOSIS — M19042 Primary osteoarthritis, left hand: Secondary | ICD-10-CM

## 2020-04-06 DIAGNOSIS — Z8639 Personal history of other endocrine, nutritional and metabolic disease: Secondary | ICD-10-CM

## 2020-04-06 DIAGNOSIS — I10 Essential (primary) hypertension: Secondary | ICD-10-CM

## 2020-04-06 DIAGNOSIS — L500 Allergic urticaria: Secondary | ICD-10-CM

## 2020-04-06 DIAGNOSIS — L219 Seborrheic dermatitis, unspecified: Secondary | ICD-10-CM

## 2020-04-06 DIAGNOSIS — M3501 Sicca syndrome with keratoconjunctivitis: Secondary | ICD-10-CM

## 2020-04-06 DIAGNOSIS — R5383 Other fatigue: Secondary | ICD-10-CM

## 2020-04-06 DIAGNOSIS — G2581 Restless legs syndrome: Secondary | ICD-10-CM

## 2020-04-26 ENCOUNTER — Ambulatory Visit: Payer: BC Managed Care – PPO | Admitting: Podiatry

## 2020-04-26 ENCOUNTER — Other Ambulatory Visit: Payer: Self-pay

## 2020-04-26 DIAGNOSIS — M7730 Calcaneal spur, unspecified foot: Secondary | ICD-10-CM | POA: Diagnosis not present

## 2020-04-26 DIAGNOSIS — M7662 Achilles tendinitis, left leg: Secondary | ICD-10-CM

## 2020-04-26 DIAGNOSIS — M7661 Achilles tendinitis, right leg: Secondary | ICD-10-CM | POA: Diagnosis not present

## 2020-04-26 DIAGNOSIS — M79671 Pain in right foot: Secondary | ICD-10-CM | POA: Diagnosis not present

## 2020-04-26 DIAGNOSIS — M79672 Pain in left foot: Secondary | ICD-10-CM

## 2020-04-26 NOTE — Patient Instructions (Signed)
For instructions on how to put on your Night Splint, please visit [email or web address] ° °Achilles Tendinitis  °with Rehab °Achilles tendinitis is a disorder of the Achilles tendon. The Achilles tendon connects the large calf muscles (Gastrocnemius and Soleus) to the heel bone (calcaneus). This tendon is sometimes called the heel cord. It is important for pushing-off and standing on your toes and is important for walking, running, or jumping. Tendinitis is often caused by overuse and repetitive microtrauma. °SYMPTOMS °Pain, tenderness, swelling, warmth, and redness may occur over the Achilles tendon even at rest. °Pain with pushing off, or flexing or extending the ankle. °Pain that is worsened after or during activity. °CAUSES  °Overuse sometimes seen with rapid increase in exercise programs or in sports requiring running and jumping. °Poor physical conditioning (strength and flexibility or endurance). °Running sports, especially training running down hills. °Inadequate warm-up before practice or play or failure to stretch before participation. °Injury to the tendon. °PREVENTION  °Warm up and stretch before practice or competition. °Allow time for adequate rest and recovery between practices and competition. °Keep up conditioning. °Keep up ankle and leg flexibility. °Improve or keep muscle strength and endurance. °Improve cardiovascular fitness. °Use proper technique. °Use proper equipment (shoes, skates). °To help prevent recurrence, taping, protective strapping, or an adhesive bandage may be recommended for several weeks after healing is complete. °PROGNOSIS  °Recovery may take weeks to several months to heal. °Longer recovery is expected if symptoms have been prolonged. °Recovery is usually quicker if the inflammation is due to a direct blow as compared with overuse or sudden strain. °RELATED COMPLICATIONS  °Healing time will be prolonged if the condition is not correctly treated. The injury must be given  plenty of time to heal. °Symptoms can reoccur if activity is resumed too soon. °Untreated, tendinitis may increase the risk of tendon rupture requiring additional time for recovery and possibly surgery. °TREATMENT  °The first treatment consists of rest anti-inflammatory medication, and ice to relieve the pain. °Stretching and strengthening exercises after resolution of pain will likely help reduce the risk of recurrence. Referral to a physical therapist or athletic trainer for further evaluation and treatment may be helpful. °A walking boot or cast may be recommended to rest the Achilles tendon. This can help break the cycle of inflammation and microtrauma. °Arch supports (orthotics) may be prescribed or recommended by your caregiver as an adjunct to therapy and rest. °Surgery to remove the inflamed tendon lining or degenerated tendon tissue is rarely necessary and has shown less than predictable results. °MEDICATION  °Nonsteroidal anti-inflammatory medications, such as aspirin and ibuprofen, may be used for pain and inflammation relief. Do not take within 7 days before surgery. Take these as directed by your caregiver. Contact your caregiver immediately if any bleeding, stomach upset, or signs of allergic reaction occur. Other minor pain relievers, such as acetaminophen, may also be used. °Pain relievers may be prescribed as necessary by your caregiver. Do not take prescription pain medication for longer than 4 to 7 days. Use only as directed and only as much as you need. °Cortisone injections are rarely indicated. Cortisone injections may weaken tendons and predispose to rupture. It is better to give the condition more time to heal than to use them. °HEAT AND COLD °Cold is used to relieve pain and reduce inflammation for acute and chronic Achilles tendinitis. Cold should be applied for 10 to 15 minutes every 2 to 3 hours for inflammation and pain and immediately after any activity that aggravates your symptoms.    Use ice packs or an ice massage. °Heat may be used before performing stretching and strengthening activities prescribed by your caregiver. Use a heat pack or a warm soak. °SEEK MEDICAL CARE IF: °Symptoms get worse or do not improve in 2 weeks despite treatment. °New, unexplained symptoms develop. Drugs used in treatment may produce side effects. ° °EXERCISES: ° °RANGE OF MOTION (ROM) AND STRETCHING EXERCISES - Achilles Tendinitis  °These exercises may help you when beginning to rehabilitate your injury. Your symptoms may resolve with or without further involvement from your physician, physical therapist or athletic trainer. While completing these exercises, remember:  °Restoring tissue flexibility helps normal motion to return to the joints. This allows healthier, less painful movement and activity. °An effective stretch should be held for at least 30 seconds. °A stretch should never be painful. You should only feel a gentle lengthening or release in the stretched tissue. ° °STRETCH  Gastroc, Standing  °Place hands on wall. °Extend right / left leg, keeping the front knee somewhat bent. °Slightly point your toes inward on your back foot. °Keeping your right / left heel on the floor and your knee straight, shift your weight toward the wall, not allowing your back to arch. °You should feel a gentle stretch in the right / left calf. Hold this position for 10 seconds. °Repeat 3 times. Complete this stretch 2 times per day. ° °STRETCH  Soleus, Standing  °Place hands on wall. °Extend right / left leg, keeping the other knee somewhat bent. °Slightly point your toes inward on your back foot. °Keep your right / left heel on the floor, bend your back knee, and slightly shift your weight over the back leg so that you feel a gentle stretch deep in your back calf. °Hold this position for 10 seconds. °Repeat 3 times. Complete this stretch 2 times per day. ° °STRETCH  Gastrocsoleus, Standing  °Note: This exercise can place a lot  of stress on your foot and ankle. Please complete this exercise only if specifically instructed by your caregiver.  °Place the ball of your right / left foot on a step, keeping your other foot firmly on the same step. °Hold on to the wall or a rail for balance. °Slowly lift your other foot, allowing your body weight to press your heel down over the edge of the step. °You should feel a stretch in your right / left calf. °Hold this position for 10 seconds. °Repeat this exercise with a slight bend in your knee. °Repeat 3 times. Complete this stretch 2 times per day.  ° °STRENGTHENING EXERCISES - Achilles Tendinitis °These exercises may help you when beginning to rehabilitate your injury. They may resolve your symptoms with or without further involvement from your physician, physical therapist or athletic trainer. While completing these exercises, remember:  °Muscles can gain both the endurance and the strength needed for everyday activities through controlled exercises. °Complete these exercises as instructed by your physician, physical therapist or athletic trainer. Progress the resistance and repetitions only as guided. °You may experience muscle soreness or fatigue, but the pain or discomfort you are trying to eliminate should never worsen during these exercises. If this pain does worsen, stop and make certain you are following the directions exactly. If the pain is still present after adjustments, discontinue the exercise until you can discuss the trouble with your clinician. ° °STRENGTH - Plantar-flexors  °Sit with your right / left leg extended. Holding onto both ends of a rubber exercise band/tubing, loop it around   the ball of your foot. Keep a slight tension in the band. °Slowly push your toes away from you, pointing them downward. °Hold this position for 10 seconds. Return slowly, controlling the tension in the band/tubing. °Repeat 3 times. Complete this exercise 2 times per day.  ° °STRENGTH - Plantar-flexors   °Stand with your feet shoulder width apart. Steady yourself with a wall or table using as little support as needed. °Keeping your weight evenly spread over the width of your feet, rise up on your toes.* °Hold this position for 10 seconds. °Repeat 3 times. Complete this exercise 2 times per day.  °*If this is too easy, shift your weight toward your right / left leg until you feel challenged. Ultimately, you may be asked to do this exercise with your right / left foot only. ° °STRENGTH  Plantar-flexors, Eccentric  °Note: This exercise can place a lot of stress on your foot and ankle. Please complete this exercise only if specifically instructed by your caregiver.  °Place the balls of your feet on a step. With your hands, use only enough support from a wall or rail to keep your balance. °Keep your knees straight and rise up on your toes. °Slowly shift your weight entirely to your right / left toes and pick up your opposite foot. Gently and with controlled movement, lower your weight through your right / left foot so that your heel drops below the level of the step. You will feel a slight stretch in the back of your calf at the end position. °Use the healthy leg to help rise up onto the balls of both feet, then lower weight only on the right / left leg again. Build up to 15 repetitions. Then progress to 3 consecutive sets of 15 repetitions.* °After completing the above exercise, complete the same exercise with a slight knee bend (about 30 degrees). Again, build up to 15 repetitions. Then progress to 3 consecutive sets of 15 repetitions.* °Perform this exercise 2 times per day.  °*When you easily complete 3 sets of 15, your physician, physical therapist or athletic trainer may advise you to add resistance by wearing a backpack filled with additional weight. ° °STRENGTH - Plantar Flexors, Seated  °Sit on a chair that allows your feet to rest flat on the ground. If necessary, sit at the edge of the chair. °Keeping your  toes firmly on the ground, lift your right / left heel as far as you can without increasing any discomfort in your ankle. °Repeat 3 times. Complete this exercise 2 times a day. ° ° °

## 2020-04-28 NOTE — Progress Notes (Signed)
Subjective:   Patient ID: Heidi Carney, female   DOB: 55 y.o.   MRN: [id number]   HPI 55 year old female presents the office today for concerns of discomfort the back of both of her heels.  The right has been almost about 1 year the left side started out 2 months ago.  She states that gets a burning pain to the back of the heel particular when going up steps.  She states he gets pain in the morning tightness mostly.  She said that she walks it feels better.  She denies recent injury or trauma.  No recent treatment.  She has no other concerns today.   Review of Systems  All other systems reviewed and are negative.  Past Medical History:  Diagnosis Date  . Anxiety   . Arthritis   . H/O total vaginal hysterectomy 05/2017  . Hypothyroidism   . Sjogren's disease [facility])     Past Surgical History:  Procedure Laterality Date  . CESAREAN SECTION  2012  . EYE SURGERY    . LAPAROSCOPIC TOTAL HYSTERECTOMY  05/2017     Current Outpatient Medications:  .  cyanocobalamin (,VITAMIN B-12,) 1000 MCG/ML injection, TAKE 1ML ONCE A MONTH., Disp: 3 mL, Rfl: 1 .  diclofenac sodium (VOLTAREN) 1 % GEL, Apply 4 g topically 4 (four) times daily. To affected joint., Disp: 100 g, Rfl: 1 .  EPINEPHrine (AUVI-Q) 0.3 mg/0.3 mL IJ SOAJ injection, Use as directed for severe allergic reactions., Disp: 2 each, Rfl: 1 .  Levothyroxine Sodium (TIROSINT) 75 MCG CAPS, TAKE 1 CAPSULE (75 MCG TOTAL) BY MOUTH DAILY BEFORE BREAKFAST., Disp: 90 capsule, Rfl: 3 .  PLAQUENIL 200 MG tablet, Take 1 tablet 200 mg BID Monday-Friday (Patient taking differently: Take 200 mg by mouth daily. Take 1 tablet 200 mg BID Monday-Friday), Disp: 120 tablet, Rfl: 0 .  venlafaxine XR (EFFEXOR-XR) 150 MG 24 hr capsule, Take 1 capsule (150 mg total) by mouth daily with breakfast., Disp: 90 capsule, Rfl: 3  Allergies  Allergen Reactions  . Shrimp [Shellfish Allergy]     Lip and tongue swelling.   Sheliah Hatch [Citalopram Hydrobromide]     Made  feel anxiety worse  . Effexor [Venlafaxine]     Blurry vision  . Elemental Sulfur     Possible reaction with lip tingling/SOB/itching  . Macrobid [Nitrofurantoin]     Bloated/itchy like a sulfite reaction  . Sulfites   . Synthroid [Levothyroxine Sodium]     Facial itching          Objective:  Physical Exam  General: AAO x3, NAD  Dermatological: Skin is warm, dry and supple bilateral. There are no open sores, no preulcerative lesions, no rash or signs of infection present.  Vascular: Dorsalis Pedis artery and Posterior Tibial artery pedal pulses are 2/4 bilateral with immedate capillary fill time. There is no pain with calf compression, swelling, warmth, erythema.   Neruologic: Grossly intact via light touch bilateral.  Negative Tinel sign.  Musculoskeletal: There is spurring present of the posterior calcaneus bilaterally with the right side worse than left.  There is discomfort mostly to this area.  There is no pain without compression of calcaneus.  No other areas of discomfort.  MMT 5/5.  Muscular strength 5/5 in all groups tested bilateral. Pes cavus present.   Gait: Unassisted, Nonantalgic.       Assessment:   Posterior calcaneal spur with insertional Achilles tendinitis     Plan:  -Treatment options discussed including all alternatives, risks, and  complications -Etiology of symptoms were discussed -Independently reviewed the x-rays that she had done previously.  Posterior calcaneal spurring is evident. -Discussed stretching, icing exercises daily.  Dispensed heel lifts.  Night splint dispensed.  Discussed shoes with arch supports.  Trula Slade DPM

## 2020-05-21 ENCOUNTER — Ambulatory Visit (INDEPENDENT_AMBULATORY_CARE_PROVIDER_SITE_OTHER): Payer: BC Managed Care – PPO | Admitting: Physician Assistant

## 2020-05-21 ENCOUNTER — Encounter: Payer: Self-pay | Admitting: Physician Assistant

## 2020-05-21 ENCOUNTER — Other Ambulatory Visit: Payer: Self-pay

## 2020-05-21 VITALS — BP 137/59 | HR 95 | Ht 64.0 in | Wt 161.0 lb

## 2020-05-21 DIAGNOSIS — Z9102 Food additives allergy status: Secondary | ICD-10-CM | POA: Diagnosis not present

## 2020-05-21 DIAGNOSIS — M3501 Sicca syndrome with keratoconjunctivitis: Secondary | ICD-10-CM | POA: Diagnosis not present

## 2020-05-21 NOTE — Patient Instructions (Signed)
No more than 2g of molybdenum a day take with meals.

## 2020-05-21 NOTE — Progress Notes (Signed)
Is in a smart patient group for sulfite allergy Has been taking molybdenum which is supposed to help break down sulfites, wants to know if there is a medication that can do the same?

## 2020-05-25 ENCOUNTER — Encounter: Payer: Self-pay | Admitting: Physician Assistant

## 2020-05-25 NOTE — Progress Notes (Signed)
Subjective:    Patient ID: Heidi Carney, female    DOB: 1965-08-14, 55 y.o.   MRN: [id number]  HPI  Patient is a 55 year old female with hypertension, chronic rhinitis, sulfa allergy, migraines,Sjogrens, GAD, vitamin D insufficiency who presents to the clinic to discuss sulfite allergy.  Patient was recently seen by allergy at home.  No real help was given.  It was just encouraged to have food, substance avoidance.  She has done some research and found a supplement called molybdenum which seems to be helping some. She wonders if this is it and how much to take.     Plaquenil has helped Sjogrens a lot. Managed by rheumatology.   Hypothyroidism-on Triosint.   .. Family History  Problem Relation Age of Onset  . Diabetes Father   . Cancer Father        prostate CA  . Stroke Father   . Glaucoma Mother   . Asthma Sister   . Psoriasis Sister        psoriatic arthritis  . Arthritis Sister   . Scleroderma Sister   . Eczema Sister   . Asthma Sister   . Healthy Daughter   . Angioedema Neg Hx   . Immunodeficiency Neg Hx   . Urticaria Neg Hx    .Marland Kitchen Social History   Socioeconomic History  . Marital status: Married    Spouse name: Not on file  . Number of children: Not on file  . Years of education: Not on file  . Highest education level: Not on file  Occupational History  . Not on file  Tobacco Use  . Smoking status: Never Smoker  . Smokeless tobacco: Never Used  Vaping Use  . Vaping Use: Never used  Substance and Sexual Activity  . Alcohol use: Not Currently  . Drug use: Never  . Sexual activity: Yes  Other Topics Concern  . Not on file  Social History Narrative  . Not on file   Social Determinants of Health   Financial Resource Strain: Not on file  Food Insecurity: Not on file  Transportation Needs: Not on file  Physical Activity: Not on file  Stress: Not on file  Social Connections: Not on file  Intimate Partner Violence: Not on file   .Marland Kitchen Active  Ambulatory Problems    Diagnosis Date Noted  . GAD (generalized anxiety disorder) 01/26/2015  . Elevated blood pressure reading 01/26/2015  . Thyroid activity decreased 01/26/2015  . Seborrheic dermatitis 03/10/2015  . Overweight 03/17/2015  . Perimenopausal symptoms 09/13/2015  . Hair loss 11/16/2015  . RLS (restless legs syndrome) 11/16/2015  . B12 deficiency 11/16/2015  . Vitamin D insufficiency 11/16/2015  . Migraine headache without aura 07/10/2016  . Essential hypertension 01/09/2017  . Left lower quadrant pain 01/24/2017  . Slow transit constipation 01/24/2017  . Right ovarian cyst 01/24/2017  . Change in stool 02/12/2017  . Nausea 07/07/2017  . Intractable cyclical vomiting with nausea 07/07/2017  . Diarrhea 07/07/2017  . Abdominal cramping 07/07/2017  . Allergic reaction 07/07/2017  . Multiple food allergies 07/12/2017  . Bouchard's nodes 12/17/2017  . Joint stiffness 12/17/2017  . Primary osteoarthritis of both hands 12/17/2017  . 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 Colon polyp 12/18/2017  . Grade I internal hemorrhoids 12/18/2017  . Increased pressure in the eye, left 01/02/2018  . Allergic urticaria 02/06/2018  . Angioedema 02/06/2018  . Food intolerance 02/06/2018  . Chronic rhinitis 02/06/2018  . Family history of psoriatic arthritis  04/08/2018  . Family history of morphea 04/08/2018  . Sjogren's syndrome with keratoconjunctivitis sicca ([place]) 04/30/2018  . Haglund's deformity of left heel 02/06/2020  . Stress 02/06/2020  . Itching 02/06/2020  . Adverse reaction to food, subsequent encounter 03/30/2020  . 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 Atypical chest pain 01/26/2015  . Benign paroxysmal positional vertigo 01/09/2017   Past Medical History:  Diagnosis Date  . Anxiety   . Arthritis   . H/O total vaginal hysterectomy 05/2017  . Hypothyroidism   . Sjogren's disease ([place])         Review of Systems  All other systems reviewed and are negative.      Objective:   Physical Exam Vitals reviewed.  Constitutional:      Appearance: Normal appearance. She is obese.  HENT:     Head: Normocephalic.  Cardiovascular:     Rate and Rhythm: Normal rate and regular rhythm.     Pulses: Normal pulses.  Pulmonary:     Effort: Pulmonary effort is normal.  Musculoskeletal:        General: Normal range of motion.  Neurological:     General: No focal deficit present.     Mental Status: She is alert and oriented to person, place, and time.  Psychiatric:        Mood and Affect: Mood normal.           Assessment & Plan:  Marland KitchenMarland KitchenElfreda was seen today for food intolerance.  Diagnoses and all orders for this visit:  Sulfite allergy  Sjogren's syndrome with keratoconjunctivitis sicca ([place])   Continue to take molybdenum 20 minutes before each meal. Do not take over 2g. Continue to follow up with rheumatology. Discussed no other known medications to help with this that are prescription. She could consider holistic provider for other supplement knowledge. Has epi pen if needed. Most of approach is food avoidance currently.

## 2020-05-27 ENCOUNTER — Telehealth: Payer: Self-pay | Admitting: Neurology

## 2020-05-27 NOTE — Telephone Encounter (Signed)
Your information has been submitted to [employer]. To check for an updated outcome later, reopen this PA request from your dashboard. If [employer] has not responded to your request within 24 hours, contact [employer] at [phone]. If you think there may be a problem with your PA request, use our live chat feature at the bottom right.    

## 2020-05-27 NOTE — Telephone Encounter (Signed)
Prior Authorization for Tirosint submitted via covermymeds. Awaiting response.

## 2020-05-28 ENCOUNTER — Encounter: Payer: Self-pay | Admitting: Physician Assistant

## 2020-06-25 NOTE — Progress Notes (Deleted)
Office Visit Note  Patient: Heidi Carney             Date of Birth: 08-27-1965           MRN: [id number]             PCP: Lavada Mesi Referring: Lavada Mesi Visit Date: 07/08/2020 Occupation: @GUAROCC @  Subjective:  No chief complaint on file.   History of Present Illness: Heidi Carney is a 55 y.o. female ***   Activities of Daily Living:  Patient reports morning stiffness for *** {minute/hour:19697}.   Patient {ACTIONS;DENIES/REPORTS:21021675::"Denies"} nocturnal pain.  Difficulty dressing/grooming: {ACTIONS;DENIES/REPORTS:21021675::"Denies"} Difficulty climbing stairs: {ACTIONS;DENIES/REPORTS:21021675::"Denies"} Difficulty getting out of chair: {ACTIONS;DENIES/REPORTS:21021675::"Denies"} Difficulty using hands for taps, buttons, cutlery, and/or writing: {ACTIONS;DENIES/REPORTS:21021675::"Denies"}  No Rheumatology ROS completed.   PMFS History:  Patient Active Problem List   Diagnosis Date Noted   Sulfite allergy 05/21/2020   Adverse reaction to food, subsequent encounter 03/30/2020   Haglund's deformity of left heel 02/06/2020   Stress 02/06/2020   Itching 02/06/2020   Sjogren's syndrome with keratoconjunctivitis sicca ([place]) 04/30/2018   Family history of psoriatic arthritis 04/08/2018   Family history of morphea 04/08/2018   Allergic urticaria 02/06/2018   Angioedema 02/06/2018   Food intolerance 02/06/2018   Chronic rhinitis 02/06/2018   Increased pressure in the eye, left 01/02/2018   Colon polyp 12/18/2017   Grade I internal hemorrhoids 12/18/2017   Bouchard's nodes 12/17/2017   Joint stiffness 12/17/2017   Primary osteoarthritis of both hands 12/17/2017   Surgical 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 replacement therapy 12/17/2017   Multiple food allergies 07/12/2017   Nausea 07/07/2017   Intractable cyclical vomiting with nausea 07/07/2017   Diarrhea 07/07/2017   Abdominal cramping 07/07/2017   Allergic reaction  07/07/2017   Change in stool 02/12/2017   Left lower quadrant pain 01/24/2017   Slow transit constipation 01/24/2017   Right ovarian cyst 01/24/2017   Essential hypertension 01/09/2017   Migraine headache without aura 07/10/2016   Hair loss 11/16/2015   RLS (restless legs syndrome) 11/16/2015   B12 deficiency 11/16/2015   Vitamin D insufficiency 11/16/2015   Perimenopausal symptoms 09/13/2015   Overweight 03/17/2015   Seborrheic dermatitis 03/10/2015   GAD (generalized anxiety disorder) 01/26/2015   Elevated blood pressure reading 01/26/2015   Thyroid activity decreased 01/26/2015    Past Medical History:  Diagnosis Date   Anxiety    Arthritis    H/O total vaginal hysterectomy 05/2017   Hypothyroidism    Sjogren's disease ([place])     Family History  Problem Relation Age of Onset   Diabetes Father    Cancer Father        prostate CA   Stroke Father    Glaucoma Mother    Asthma Sister    Psoriasis Sister        psoriatic arthritis   Arthritis Sister    Scleroderma Sister    Eczema Sister    Asthma Sister    Healthy Daughter    Angioedema Neg Hx    Immunodeficiency Neg Hx    Urticaria Neg Hx    Past Surgical History:  Procedure Laterality Date   CESAREAN SECTION  2012   [place] TOTAL HYSTERECTOMY  05/2017   Social History   Social History Narrative   Not on file   Immunization History  Administered Date(s) Administered   Influenza Inj Mdck Quad Pf 01/08/2019   Influenza, Quadrivalent, Recombinant, Inj, Pf 12/21/2016, 03/15/2018   Influenza,inj,Quad  PF,6+ Mos 03/17/2015, 02/02/2016   PFIZER(Purple Top)SARS-COV-2 Vaccination 05/30/2019, 06/27/2019, 01/09/2020     Objective: Vital Signs: LMP 01/21/2015    Physical Exam   Musculoskeletal Exam: ***  CDAI Exam: CDAI Score: -- Patient Global: --; Provider Global: -- Swollen: --; Tender: -- Joint Exam 07/08/2020   No joint exam has been  documented for this visit   There is currently no information documented on the homunculus. Go to the Rheumatology activity and complete the homunculus joint exam.  Investigation: No additional findings.  Imaging: No results found.  Recent Labs: Lab Results  Component Value Date   WBC 4.6 02/04/2020   HGB 14.1 02/04/2020   PLT 183 02/04/2020   NA 140 02/04/2020   K 3.9 02/04/2020   CL 103 02/04/2020   CO2 27 02/04/2020   GLUCOSE 82 02/04/2020   BUN 10 02/04/2020   CREATININE 0.97 02/04/2020   BILITOT 0.4 02/04/2020   ALKPHOS 87 02/06/2018   AST 21 02/04/2020   ALT 24 02/04/2020   PROT 7.5 02/04/2020   ALBUMIN 4.4 02/06/2018   CALCIUM 9.3 02/04/2020   GFRAA 78 02/04/2020    Speciality Comments: PLQ Eye Exam 01/01/2020, Normal @ [facility]   Procedures:  No procedures performed Allergies: Shrimp [shellfish allergy], Celexa [citalopram hydrobromide], Effexor [venlafaxine], Elemental sulfur, Macrobid [nitrofurantoin], Sulfites, and Synthroid [levothyroxine sodium]   Assessment / Plan:     Visit Diagnoses: No diagnosis found.  Orders: No orders of the defined types were placed in this encounter.  No orders of the defined types were placed in this encounter.   Face-to-face time spent with patient was *** minutes. Greater than 50% of time was spent in counseling and coordination of care.  Follow-Up Instructions: No follow-ups on file.   Earnestine Mealing, CMA  Note - This record has been created using Editor, commissioning.  Chart creation errors have been sought, but may not always  have been located. Such creation errors do not reflect on  the standard of medical care.

## 2020-07-02 ENCOUNTER — Encounter: Payer: Self-pay | Admitting: Rheumatology

## 2020-07-02 NOTE — Progress Notes (Signed)
Office Visit Note  Patient: Heidi Carney             Date of Birth: Jan 17, 1966           MRN: [id number]             PCP: Lavada Mesi Referring: Donella Stade, PA-C Visit Date: 07/08/2020 Occupation: @GUAROCC @  Subjective:  Medication monitoring     History of Present Illness: Heidi Carney is a 55 y.o. female with history of sjogren's syndrome and osteoarthritis.  She is taking plaquenil 200 mg 1 tablet by mouth twice daily Monday through Friday only.  She is tolerating Plaquenil without any side effects.  She has noticed significant clinical improvement since starting on Plaquenil.  Her fatigue and brain fog have improved.  She continues to have occasional fatigue secondary to insomnia.  She states that overall her sicca symptoms have been tolerable.  She uses xylimelt prior to choir practice and tries to drink water throughout the day which alleviates her symptoms.  She uses a gel lubricating drop at night to help with eye dryness.  She continues to see her ophthalmologist on a yearly basis.  She is also been seeing her dentist every 6 months and denies any dental caries.  She denies any swollen lymph nodes.  She continues to have some discomfort and stiffness in both hands.  She denies any joint swelling.  She uses voltaren gel topically as needed for pain relief.       Activities of Daily Living:  Patient reports morning stiffness for 5 minutes.   Patient Denies nocturnal pain.  Difficulty dressing/grooming: Denies Difficulty climbing stairs: Denies Difficulty getting out of chair: Denies Difficulty using hands for taps, buttons, cutlery, and/or writing: Denies  Review of Systems  Constitutional: Positive for fatigue.  HENT: Positive for mouth dryness and nose dryness. Negative for mouth sores.   Eyes: Positive for dryness. Negative for pain and itching.  Respiratory: Negative for shortness of breath and difficulty breathing.   Cardiovascular: Negative for  chest pain and palpitations.  Gastrointestinal: Positive for constipation. Negative for blood in stool and diarrhea.  Endocrine: Negative for increased urination.  Genitourinary: Positive for difficulty urinating.  Musculoskeletal: Positive for myalgias, morning stiffness and myalgias. Negative for arthralgias, joint pain, joint swelling and muscle tenderness.  Skin: Negative for color change, rash and redness.  Allergic/Immunologic: Negative for susceptible to infections.  Neurological: Negative for dizziness, numbness, headaches, memory loss and weakness.  Hematological: Negative for bruising/bleeding tendency.  Psychiatric/Behavioral: Negative for confusion.    PMFS History:  Patient Active Problem List   Diagnosis Date Noted  . Sulfite allergy 05/21/2020  . Adverse reaction to food, subsequent encounter 03/30/2020  . Haglund's deformity of left heel 02/06/2020  . Stress 02/06/2020  . Itching 02/06/2020  . Sjogren's syndrome with keratoconjunctivitis sicca ([place]) 04/30/2018  . Family history of psoriatic arthritis 04/08/2018  . Family history of morphea 04/08/2018  . Allergic urticaria 02/06/2018  . Angioedema 02/06/2018  . Food intolerance 02/06/2018  . Chronic rhinitis 02/06/2018  . Increased pressure in the eye, left 01/02/2018  . Colon polyp 12/18/2017  . Grade I internal hemorrhoids 12/18/2017  . Bouchard's nodes 12/17/2017  . Joint stiffness 12/17/2017  . Primary osteoarthritis of both hands 12/17/2017  . 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 Multiple food allergies 07/12/2017  . Nausea 07/07/2017  . Intractable cyclical vomiting with nausea 07/07/2017  . Diarrhea 07/07/2017  . Abdominal cramping 07/07/2017  .  Allergic reaction 07/07/2017  . Change in stool 02/12/2017  . Left lower quadrant pain 01/24/2017  . Slow transit constipation 01/24/2017  . Right ovarian cyst 01/24/2017  . Essential hypertension 01/09/2017  . Migraine headache  without aura 07/10/2016  . Hair loss 11/16/2015  . RLS (restless legs syndrome) 11/16/2015  . B12 deficiency 11/16/2015  . Vitamin D insufficiency 11/16/2015  . Perimenopausal symptoms 09/13/2015  . Overweight 03/17/2015  . Seborrheic dermatitis 03/10/2015  . GAD (generalized anxiety disorder) 01/26/2015  . Elevated blood pressure reading 01/26/2015  . Thyroid activity decreased 01/26/2015    Past Medical History:  Diagnosis Date  . Anxiety   . Arthritis   . H/O total vaginal hysterectomy 05/2017  . Hypothyroidism   . Sjogren's disease ([place])     Family History  Problem Relation Age of Onset  . Diabetes Father   . Cancer Father        prostate CA  . Stroke Father   . Glaucoma Mother   . Asthma Sister   . Psoriasis Sister        psoriatic arthritis  . Arthritis Sister   . Scleroderma Sister   . Eczema Sister   . Asthma Sister   . Healthy Daughter   . Angioedema Neg Hx   . Immunodeficiency Neg Hx   . Urticaria Neg Hx    Past Surgical History:  Procedure Laterality Date  . CESAREAN SECTION  2012  . EYE SURGERY    . LAPAROSCOPIC TOTAL HYSTERECTOMY  05/2017   Social History   Social History Narrative  . Not on file   Immunization History  Administered Date(s) Administered  . Influenza Inj Mdck Quad Pf 01/08/2019  . Influenza, Quadrivalent, Recombinant, Inj, Pf 12/21/2016, 03/15/2018  . Influenza,inj,Quad PF,6+ Mos 03/17/2015, 02/02/2016  . PFIZER(Purple Top)SARS-COV-2 Vaccination 05/30/2019, 06/27/2019, 01/09/2020     Objective: Vital Signs: BP 134/85 (BP Location: Left Arm, Patient Position: Sitting, Cuff Size: Normal)   Pulse 83   Resp 14   Ht 5\' 4"  (1.626 m)   Wt 161 lb 3.2 oz (73.1 kg)   LMP 01/21/2015   BMI 27.67 kg/m    Physical Exam Vitals and nursing note reviewed.  Constitutional:      Appearance: She is well-developed.  HENT:     Head: Normocephalic and atraumatic.  Eyes:     Conjunctiva/sclera: Conjunctivae normal.  Pulmonary:      Effort: Pulmonary effort is normal.  Abdominal:     Palpations: Abdomen is soft.  Musculoskeletal:     Cervical back: Normal range of motion.  Skin:    General: Skin is warm and dry.     Capillary Refill: Capillary refill takes less than 2 seconds.  Neurological:     Mental Status: She is alert and oriented to person, place, and time.  Psychiatric:        Behavior: Behavior normal.      Musculoskeletal Exam: C-spine, thoracic spine, and lumbar spine good ROM. Shoulder joints, elbow joints, wrist joints, MCPs, PIPs, and DIPs good ROM with no synovitis.  PIP and DIP thickening consistent with OA of both hands. Complete fist formation bilaterally.  Hip joints, knee joints, and ankle joints good ROM with no discomfort.  No warmth or effusion of knee joints.  No tendernesss or swelling of ankle joints. No evidence of achilles tendonitis or plantar fasciitis.   CDAI Exam: CDAI Score: -- Patient Global: --; Provider Global: -- Swollen: --; Tender: -- Joint Exam 07/08/2020   No joint  exam has been documented for this visit   There is currently no information documented on the homunculus. Go to the Rheumatology activity and complete the homunculus joint exam.  Investigation: No additional findings.  Imaging: No results found.  Recent Labs: Lab Results  Component Value Date   WBC 5.4 07/06/2020   HGB 12.8 07/06/2020   PLT 164 07/06/2020   NA 139 07/06/2020   K 4.5 07/06/2020   CL 103 07/06/2020   CO2 29 07/06/2020   GLUCOSE 73 07/06/2020   BUN 10 07/06/2020   CREATININE 0.89 07/06/2020   BILITOT 0.4 07/06/2020   ALKPHOS 87 02/06/2018   AST 22 07/06/2020   ALT 21 07/06/2020   PROT 7.1 07/06/2020   ALBUMIN 4.4 02/06/2018   CALCIUM 9.1 07/06/2020   GFRAA 86 07/06/2020    Speciality Comments: PLQ Eye Exam 01/01/2020, Normal @ [facility]   Procedures:  No procedures performed Allergies: Shrimp [shellfish allergy], Celexa [citalopram  hydrobromide], Effexor [venlafaxine], Elemental sulfur, Macrobid [nitrofurantoin], Sulfites, and Synthroid [levothyroxine sodium]    Assessment / Plan:     Visit Diagnoses: Sjogren's syndrome with keratoconjunctivitis sicca ([place]) - Sicca symptoms with positive ANA and positive Ro: She has not developed any new or worsening symptoms since her last office visit.  She is clinically doing well on Plaquenil 200 mg 1 tablet by mouth twice daily Monday through Friday.  She has noted significant clinical improvement since starting on Plaquenil.  She has had less frequent brain fog and fatigue.  She is also noticed improvement in her joint pain and inflammation.  She continues to have some discomfort and stiffness in both hands for about 5 minutes daily.  She uses Voltaren gel topically as needed for pain relief.  No synovitis was noted on examination today.  She continues to have chronic sicca symptoms which have been tolerable overall.  She has been using gel lubricating drops as well as XyliMelts over-the-counter for symptomatic relief.  She continues to see her ophthalmologist every year and her dentist every 6 months as recommended.  She will continue on Plaquenil as prescribed.  A refill was sent to the pharmacy today.  She was advised to notify us if she develops any new or worsening symptoms. She will follow-up in the office in 6 months.  [place] medication use - Plaquenil 200 mg 1 tablet by mouth twice daily Monday through Friday.  CBC and CMP updated on 07/06/20.  Lab work was reviewed with the patient today in the office and all questions were addressed.   PLQ Eye Exam 01/01/2020, Normal @ [facility].  She has not had any recent infections.  Primary osteoarthritis of both hands: She has PIP and DIP thickening consistent with osteoarthritis of both hands.  She continues to experience discomfort and stiffness in both hands for about 5 minutes every morning.  She uses Voltaren  gel topically as needed for pain relief.  We discussed the importance of joint protection and muscle strengthening.  Vitamin D insufficiency  Pain in both feet -evaluated by Dr. Jacqualyn Posey.  Diagnosed with posterior calcaneal spur per with insertional Achilles tendinitis.  She was given a night splint which has alleviated her discomfort significantly.  Other medical conditions are listed as follows:   Seborrheic dermatitis  Essential hypertension: BP 134/85 today.   Increased pressure in the eye, left  Migraine without aura and without status migrainosus, not intractable  GAD (generalized anxiety disorder)  RLS (restless legs syndrome)  Family history of  psoriatic arthritis  Family history of morphea  Allergic urticaria  Other fatigue: Stable.   History of hypothyroidism  Vitamin B12 deficiency: She received monthly B12 injections.   Orders: No orders of the defined types were placed in this encounter.  Meds ordered this encounter  Medications  . PLAQUENIL 200 MG tablet    Sig: Take 1 tablet 200 mg BID Monday-Friday    Dispense:  120 tablet    Refill:  0     Follow-Up Instructions: Return in about 5 months (around 12/08/2020) for Sjogren's syndrome, Osteoarthritis.   Ofilia Neas, PA-C  Note - This record has been created using Dragon software.  Chart creation errors have been sought, but may not always  have been located. Such creation errors do not reflect on  the standard of medical care.

## 2020-07-05 ENCOUNTER — Telehealth: Payer: Self-pay

## 2020-07-05 ENCOUNTER — Other Ambulatory Visit: Payer: Self-pay | Admitting: *Deleted

## 2020-07-05 DIAGNOSIS — M3501 Sicca syndrome with keratoconjunctivitis: Secondary | ICD-10-CM

## 2020-07-05 NOTE — Telephone Encounter (Signed)
Patient left a voicemail requesting her labwork orders be faxed to [facility] in [place].  Patient states she has appointment scheduled for Thursday, 07/08/20.  Fax [phone]

## 2020-07-05 NOTE — Telephone Encounter (Signed)
Faxed

## 2020-07-06 NOTE — Progress Notes (Signed)
RBC count is borderline low-3.66.  MCV and MCH are borderline elevated but stable.  Rest of CBC WNL.

## 2020-07-07 LAB — CBC WITH DIFFERENTIAL/PLATELET
Absolute Monocytes: 589 cells/uL (ref 200–950)
Basophils Absolute: 38 cells/uL (ref 0–200)
Basophils Relative: 0.7 %
Eosinophils Absolute: 162 cells/uL (ref 15–500)
Eosinophils Relative: 3 %
HCT: 37.2 % (ref 35.0–45.0)
Hemoglobin: 12.8 g/dL (ref 11.7–15.5)
Lymphs Abs: 1712 cells/uL (ref 850–3900)
MCH: 35 pg — ABNORMAL HIGH (ref 27.0–33.0)
MCHC: 34.4 g/dL (ref 32.0–36.0)
MCV: 101.6 fL — ABNORMAL HIGH (ref 80.0–100.0)
MPV: 10.5 fL (ref 7.5–12.5)
Monocytes Relative: 10.9 %
Neutro Abs: 2900 cells/uL (ref 1500–7800)
Neutrophils Relative %: 53.7 %
Platelets: 164 10*3/uL (ref 140–400)
RBC: 3.66 10*6/uL — ABNORMAL LOW (ref 3.80–5.10)
RDW: 12.1 % (ref 11.0–15.0)
Total Lymphocyte: 31.7 %
WBC: 5.4 10*3/uL (ref 3.8–10.8)

## 2020-07-07 LAB — COMPLETE METABOLIC PANEL WITH GFR
AG Ratio: 1.4 (calc) (ref 1.0–2.5)
ALT: 21 U/L (ref 6–29)
AST: 22 U/L (ref 10–35)
Albumin: 4.1 g/dL (ref 3.6–5.1)
Alkaline phosphatase (APISO): 74 U/L (ref 37–153)
BUN: 10 mg/dL (ref 7–25)
CO2: 29 mmol/L (ref 20–32)
Calcium: 9.1 mg/dL (ref 8.6–10.4)
Chloride: 103 mmol/L (ref 98–110)
Creat: 0.89 mg/dL (ref 0.50–1.05)
GFR, Est African American: 86 mL/min/{1.73_m2} (ref >=60)
GFR, Est Non African American: 75 mL/min/{1.73_m2} (ref >=60)
Globulin: 3 g/dL (calc) (ref 1.9–3.7)
Glucose, Bld: 73 mg/dL (ref 65–139)
Potassium: 4.5 mmol/L (ref 3.5–5.3)
Sodium: 139 mmol/L (ref 135–146)
Total Bilirubin: 0.4 mg/dL (ref 0.2–1.2)
Total Protein: 7.1 g/dL (ref 6.1–8.1)

## 2020-07-07 NOTE — Progress Notes (Signed)
CMP WNL

## 2020-07-08 ENCOUNTER — Encounter: Payer: Self-pay | Admitting: Physician Assistant

## 2020-07-08 ENCOUNTER — Other Ambulatory Visit: Payer: Self-pay

## 2020-07-08 ENCOUNTER — Ambulatory Visit: Payer: BC Managed Care – PPO | Admitting: Rheumatology

## 2020-07-08 ENCOUNTER — Ambulatory Visit: Payer: BC Managed Care – PPO | Admitting: Physician Assistant

## 2020-07-08 VITALS — BP 134/85 | HR 83 | Resp 14 | Ht 64.0 in | Wt 161.2 lb

## 2020-07-08 DIAGNOSIS — Z8261 Family history of arthritis: Secondary | ICD-10-CM

## 2020-07-08 DIAGNOSIS — M19042 Primary osteoarthritis, left hand: Secondary | ICD-10-CM

## 2020-07-08 DIAGNOSIS — Z79899 Other long term (current) drug therapy: Secondary | ICD-10-CM

## 2020-07-08 DIAGNOSIS — M19041 Primary osteoarthritis, right hand: Secondary | ICD-10-CM | POA: Diagnosis not present

## 2020-07-08 DIAGNOSIS — H40052 Ocular hypertension, left eye: Secondary | ICD-10-CM

## 2020-07-08 DIAGNOSIS — E559 Vitamin D deficiency, unspecified: Secondary | ICD-10-CM

## 2020-07-08 DIAGNOSIS — I10 Essential (primary) hypertension: Secondary | ICD-10-CM

## 2020-07-08 DIAGNOSIS — E538 Deficiency of other specified B group vitamins: Secondary | ICD-10-CM

## 2020-07-08 DIAGNOSIS — R5383 Other fatigue: Secondary | ICD-10-CM

## 2020-07-08 DIAGNOSIS — Z8669 Personal history of other diseases of the nervous system and sense organs: Secondary | ICD-10-CM

## 2020-07-08 DIAGNOSIS — G2581 Restless legs syndrome: Secondary | ICD-10-CM

## 2020-07-08 DIAGNOSIS — Z8639 Personal history of other endocrine, nutritional and metabolic disease: Secondary | ICD-10-CM

## 2020-07-08 DIAGNOSIS — M79672 Pain in left foot: Secondary | ICD-10-CM

## 2020-07-08 DIAGNOSIS — G43009 Migraine without aura, not intractable, without status migrainosus: Secondary | ICD-10-CM

## 2020-07-08 DIAGNOSIS — Z840 Family history of diseases of the skin and subcutaneous tissue: Secondary | ICD-10-CM

## 2020-07-08 DIAGNOSIS — F411 Generalized anxiety disorder: Secondary | ICD-10-CM

## 2020-07-08 DIAGNOSIS — M3501 Sicca syndrome with keratoconjunctivitis: Secondary | ICD-10-CM

## 2020-07-08 DIAGNOSIS — M79671 Pain in right foot: Secondary | ICD-10-CM

## 2020-07-08 DIAGNOSIS — L500 Allergic urticaria: Secondary | ICD-10-CM

## 2020-07-08 DIAGNOSIS — L219 Seborrheic dermatitis, unspecified: Secondary | ICD-10-CM

## 2020-07-08 MED ORDER — PLAQUENIL 200 MG PO TABS: ORAL_TABLET | ORAL | 0 refills | Status: DC

## 2020-07-09 ENCOUNTER — Telehealth: Payer: Self-pay | Admitting: *Deleted

## 2020-07-09 DIAGNOSIS — M3501 Sicca syndrome with keratoconjunctivitis: Secondary | ICD-10-CM

## 2020-07-09 NOTE — Telephone Encounter (Signed)
Pt left a vm today wanting you to place a referral for the specific therapist you talked with her about.  She stated that she wants to talk to someone about living with and how to deal with autoimmune disease.

## 2020-07-12 NOTE — Telephone Encounter (Signed)
Can we make referral for her to specifically see Janett Billow? I can't remember her last name. 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

## 2020-07-12 NOTE — Telephone Encounter (Signed)
Referral placed for Myra Gianotti, they should call patient with appt.

## 2020-07-13 NOTE — Telephone Encounter (Signed)
[facility] notifying pt of referral.

## 2020-07-22 NOTE — Telephone Encounter (Signed)
X-rays of both knees and both feet are consistent with osteoarthritis.  She continues to have chronic fatigue secondary to sjogren's syndrome.   Ok to provide temporary handicap placard.

## 2020-08-04 ENCOUNTER — Ambulatory Visit (INDEPENDENT_AMBULATORY_CARE_PROVIDER_SITE_OTHER): Payer: BC Managed Care – PPO | Admitting: Psychology

## 2020-08-04 DIAGNOSIS — F064 Anxiety disorder due to known physiological condition: Secondary | ICD-10-CM | POA: Diagnosis not present

## 2020-09-14 ENCOUNTER — Ambulatory Visit (INDEPENDENT_AMBULATORY_CARE_PROVIDER_SITE_OTHER): Payer: BC Managed Care – PPO | Admitting: Psychology

## 2020-09-14 DIAGNOSIS — F064 Anxiety disorder due to known physiological condition: Secondary | ICD-10-CM

## 2020-09-16 LAB — HM MAMMOGRAPHY

## 2020-09-17 ENCOUNTER — Other Ambulatory Visit: Payer: Self-pay | Admitting: Physician Assistant

## 2020-09-17 NOTE — Telephone Encounter (Signed)
Last Visit: 07/08/2020  Next Visit: 12/09/2020  Labs: 07/06/2020 CMP WNL. RBC count is borderline low-3.66.  MCV and MCH are borderline elevated butstable.  Rest of CBC WNL.   Eye exam: 01/01/2020   Current Dose per office note 07/08/2020: Plaquenil 200 mg 1 tablet by mouth twice daily Monday through Friday  [id number] syndrome with keratoconjunctivitis sicca  Last Fill: 07/08/2020  Okay to refill Plaquenil?

## 2020-09-20 ENCOUNTER — Ambulatory Visit (INDEPENDENT_AMBULATORY_CARE_PROVIDER_SITE_OTHER): Payer: BC Managed Care – PPO | Admitting: Psychology

## 2020-09-20 DIAGNOSIS — F064 Anxiety disorder due to known physiological condition: Secondary | ICD-10-CM | POA: Diagnosis not present

## 2020-09-28 ENCOUNTER — Ambulatory Visit (INDEPENDENT_AMBULATORY_CARE_PROVIDER_SITE_OTHER): Payer: BC Managed Care – PPO | Admitting: Psychology

## 2020-09-28 DIAGNOSIS — F064 Anxiety disorder due to known physiological condition: Secondary | ICD-10-CM | POA: Diagnosis not present

## 2020-10-29 ENCOUNTER — Other Ambulatory Visit: Payer: Self-pay

## 2020-10-29 ENCOUNTER — Ambulatory Visit (INDEPENDENT_AMBULATORY_CARE_PROVIDER_SITE_OTHER): Payer: BC Managed Care – PPO | Admitting: Physician Assistant

## 2020-10-29 VITALS — BP 135/83 | HR 94 | Temp 98.8°F

## 2020-10-29 DIAGNOSIS — R300 Dysuria: Secondary | ICD-10-CM

## 2020-10-29 LAB — POCT URINALYSIS DIP (CLINITEK)
Bilirubin, UA: NEGATIVE
Glucose, UA: NEGATIVE mg/dL
Ketones, POC UA: NEGATIVE mg/dL
Nitrite, UA: NEGATIVE
POC PROTEIN,UA: NEGATIVE
Spec Grav, UA: 1.015 (ref 1.010–1.025)
Urobilinogen, UA: 0.2 E.U./dL
pH, UA: 6.5 (ref 5.0–8.0)

## 2020-10-29 MED ORDER — PHENAZOPYRIDINE HCL 200 MG PO TABS: 200.0000 mg | ORAL_TABLET | Freq: Three times a day (TID) | ORAL | 0 refills | Status: AC

## 2020-10-29 NOTE — Progress Notes (Signed)
..   Results for orders placed or performed in visit on 10/29/20  POCT URINALYSIS DIP (CLINITEK)  Result Value Ref Range   Color, UA yellow yellow   Clarity, UA clear clear   Glucose, UA negative negative mg/dL   Bilirubin, UA negative negative   Ketones, POC UA negative negative mg/dL   Spec Grav, UA 1.015 1.010 - 1.025   Blood, UA small (A) negative   pH, UA 6.5 5.0 - 8.0   POC PROTEIN,UA negative negative, trace   Urobilinogen, UA 0.2 0.2 or 1.0 E.U./dL   Nitrite, UA Negative Negative   Leukocytes, UA Small (1+) (A) Negative   Small blood and small leukocytes. Having dysuria with no other symptoms.  No nitrites, protein.  Start pyridium.  Culture urine.  Call with any worsening symptoms, fever, chills, back pain to consider abx if culture now back.

## 2020-10-29 NOTE — Progress Notes (Signed)
Established Patient Office Visit  Subjective:  Patient ID: Heidi Carney, female    DOB: 1966/01/30  Age: 55 y.o. MRN: [id number]  CC:  Chief Complaint  Patient presents with   Dysuria    HPI Heidi Carney presents for Dysuria  Patient came in today for frequency and burning during urination for 2 days. Patient reports no recent antibiotic use or recent catheterization. Patient has not taken Azo. Denies flank pain, fever, chills or sweats.  Past Medical History:  Diagnosis Date   Anxiety    Arthritis    H/O total vaginal hysterectomy 05/2017   Hypothyroidism    Sjogren's disease ([place])     Past Surgical History:  Procedure Laterality Date   CESAREAN SECTION  2012   EYE SURGERY     LAPAROSCOPIC TOTAL HYSTERECTOMY  05/2017    Family History  Problem Relation Age of Onset   Diabetes Father    Cancer Father        prostate CA   Stroke Father    Glaucoma Mother    Asthma Sister    Psoriasis Sister        psoriatic arthritis   Arthritis Sister    Scleroderma Sister    Eczema Sister    Asthma Sister    Healthy Daughter    Angioedema Neg Hx    Immunodeficiency Neg Hx    Urticaria Neg Hx     Social History   Socioeconomic History   Marital status: Married    Spouse name: Not on file   Number of children: Not on file   Years of education: Not on file   Highest education level: Not on file  Occupational History   Not on file  Tobacco Use   Smoking status: Never   Smokeless tobacco: Never  Vaping Use   Vaping Use: Never used  Substance and Sexual Activity   Alcohol use: Not Currently   Drug use: Never   Sexual activity: Yes  Other Topics Concern   Not on file  Social History Narrative   Not on file   Social Determinants of Health   Financial Resource Strain: Not on file  Food Insecurity: Not on file  Transportation Needs: Not on file  Physical Activity: Not on file  Stress: Not on file  Social Connections: Not on file  Intimate Partner  Violence: Not on file    Outpatient Medications Prior to Visit  Medication Sig Dispense Refill   cyanocobalamin (,VITAMIN B-12,) 1000 MCG/ML injection TAKE 1ML ONCE A MONTH. 3 mL 1   diclofenac sodium (VOLTAREN) 1 % GEL Apply 4 g topically 4 (four) times daily. To affected joint. 100 g 1   EPINEPHrine (AUVI-Q) 0.3 mg/0.3 mL IJ SOAJ injection Use as directed for severe allergic reactions. 2 each 1   Levothyroxine Sodium (TIROSINT) 75 MCG CAPS TAKE 1 CAPSULE (75 MCG TOTAL) BY MOUTH DAILY BEFORE BREAKFAST. 90 capsule 3   MOLYBDENUM PO Take by mouth as needed.     PLAQUENIL 200 MG tablet TAKE 1 TABLET 200 MG TWICE A DAY MONDAY-FRIDAY 120 tablet 0   venlafaxine XR (EFFEXOR-XR) 150 MG 24 hr capsule Take 1 capsule (150 mg total) by mouth daily with breakfast. 90 capsule 3   No facility-administered medications prior to visit.    Allergies  Allergen Reactions   Shrimp [Shellfish Allergy]     Lip and tongue swelling.    Celexa [Citalopram Hydrobromide]     Made feel anxiety worse   Effexor [Venlafaxine]  Blurry vision   Elemental Sulfur     Possible reaction with lip tingling/SOB/itching   Macrobid [Nitrofurantoin]     Bloated/itchy like a sulfite reaction   Sulfites    Synthroid [Levothyroxine Sodium]     Facial itching    ROS Review of Systems    Objective:    Physical Exam  BP 135/83 (BP Location: Left Arm, Patient Position: Sitting, Cuff Size: Small)   Pulse 94   Temp 98.8 F (37.1 C)   LMP 01/21/2015   SpO2 94%  Wt Readings from Last 3 Encounters:  07/08/20 161 lb 3.2 oz (73.1 kg)  05/21/20 161 lb (73 kg)  04/06/20 169 lb 9.6 oz (76.9 kg)     Health Maintenance Due  Topic Date Due   Pneumococcal Vaccine 32-31 Years old (1 - PCV) Never done   HIV Screening  Never done   Hepatitis C Screening  Never done   Zoster Vaccines- Shingrix (1 of 2) Never done   COVID-19 Vaccine (4 - Booster for Pfizer series) 04/10/2020    There are no preventive care reminders to  display for this patient.  Lab Results  Component Value Date   TSH 3.19 02/04/2020   Lab Results  Component Value Date   WBC 5.4 07/06/2020   HGB 12.8 07/06/2020   HCT 37.2 07/06/2020   MCV 101.6 (H) 07/06/2020   PLT 164 07/06/2020   Lab Results  Component Value Date   NA 139 07/06/2020   K 4.5 07/06/2020   CO2 29 07/06/2020   GLUCOSE 73 07/06/2020   BUN 10 07/06/2020   CREATININE 0.89 07/06/2020   BILITOT 0.4 07/06/2020   ALKPHOS 87 02/06/2018   AST 22 07/06/2020   ALT 21 07/06/2020   PROT 7.1 07/06/2020   ALBUMIN 4.4 02/06/2018   CALCIUM 9.1 07/06/2020   Lab Results  Component Value Date   CHOL 176 02/04/2020   Lab Results  Component Value Date   HDL 58 02/04/2020   Lab Results  Component Value Date   LDLCALC 95 02/04/2020   Lab Results  Component Value Date   TRIG 134 02/04/2020   Lab Results  Component Value Date   CHOLHDL 3.0 02/04/2020   No results found for: HGBA1C    Assessment & Plan:  Per Luvenia Starch, sent for urine culture. She will follow up as needed or if symptoms get worse.  Problem List Items Addressed This Visit   None Visit Diagnoses     Dysuria    -  Primary   Relevant Orders   Urine Culture   POCT URINALYSIS DIP (CLINITEK)       No orders of the defined types were placed in this encounter.   Follow-up: Return if symptoms worsen or fail to improve.    Gust Brooms, CMA

## 2020-10-31 LAB — URINE CULTURE
MICRO NUMBER:: 12181909
SPECIMEN QUALITY:: ADEQUATE

## 2020-11-01 ENCOUNTER — Other Ambulatory Visit: Payer: Self-pay | Admitting: Physician Assistant

## 2020-11-01 ENCOUNTER — Encounter: Payer: Self-pay | Admitting: Physician Assistant

## 2020-11-01 ENCOUNTER — Telehealth: Payer: Self-pay

## 2020-11-01 MED ORDER — AMOXICILLIN-POT CLAVULANATE 500-125 MG PO TABS: 1.0000 | ORAL_TABLET | Freq: Two times a day (BID) | ORAL | 0 refills | Status: DC

## 2020-11-01 MED ORDER — CIPROFLOXACIN HCL 500 MG PO TABS: 500.0000 mg | ORAL_TABLET | Freq: Two times a day (BID) | ORAL | 0 refills | Status: AC

## 2020-11-01 NOTE — Progress Notes (Signed)
Your urine culture did grow bacteria. Will send cipro to take for 3 days due to other medication allergies.

## 2020-11-01 NOTE — Telephone Encounter (Signed)
Heidi Carney with [facility] called regarding the Rx for ciprofloxacin that was sent over for pt this morning. He said that this med has an interaction warning when taking with Plaquinel, stating it can increase QTC prolongation and arrhythmias. They are wanting to know if they should change the med or continue as written.

## 2020-11-01 NOTE — Telephone Encounter (Signed)
Pt aware of Rx change via patient message dated 11/01/2020

## 2020-11-01 NOTE — Telephone Encounter (Signed)
I sent augmentin to replace cipro due to interaction.

## 2020-11-09 IMAGING — DX LEFT KNEE - COMPLETE 4+ VIEW
4 series · 4 of 4 positions shown · non-contrast
Comparison: None.

CLINICAL DATA: Pain for approximately 6 months

EXAM:
LEFT KNEE - COMPLETE 4+ VIEW

[knee lat]
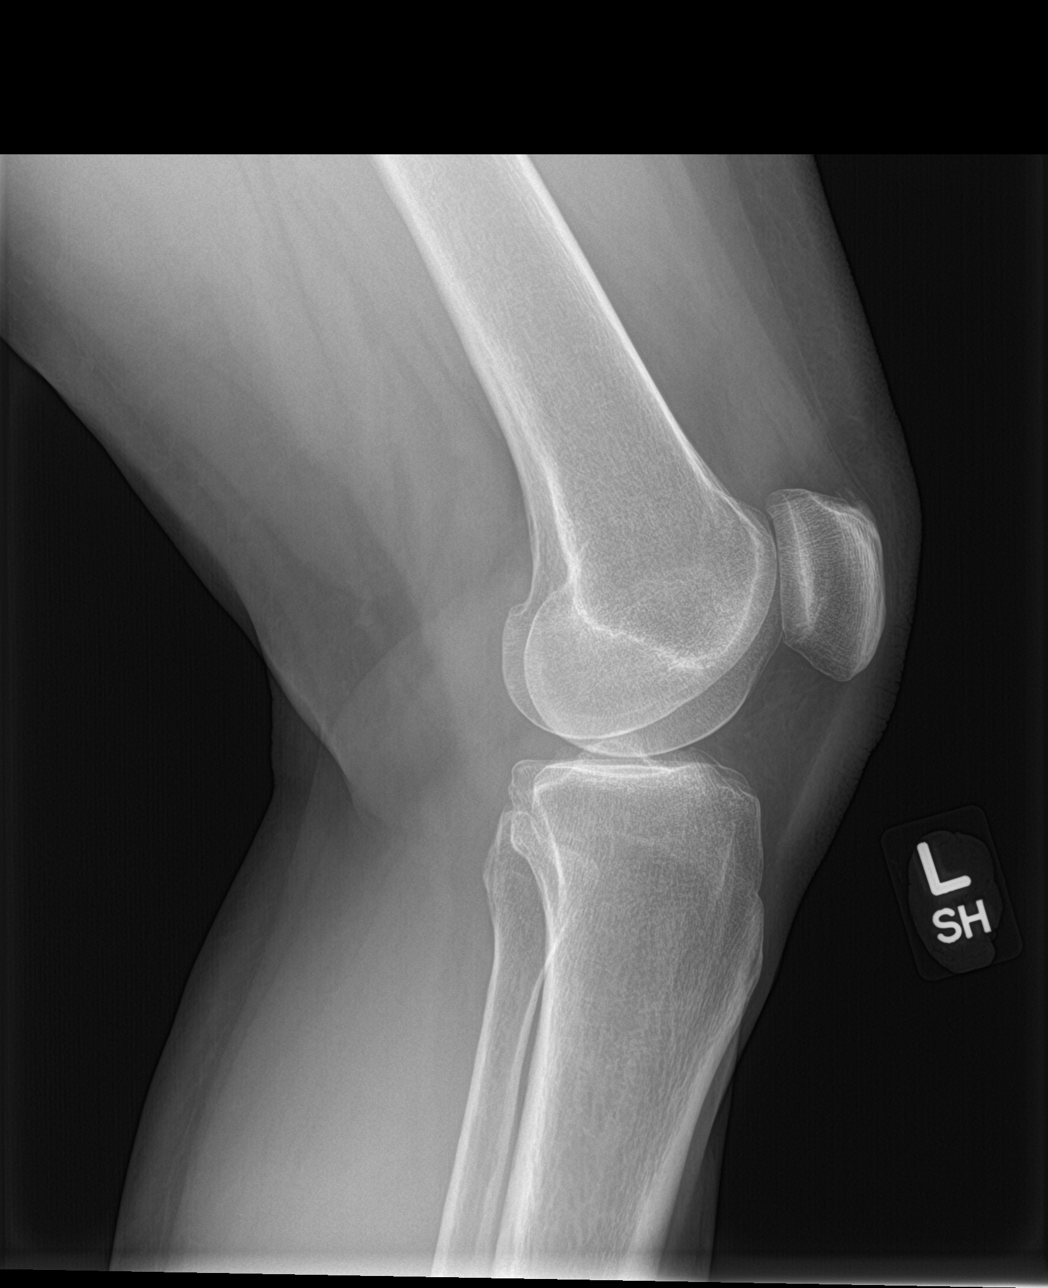

[knee sunrise]
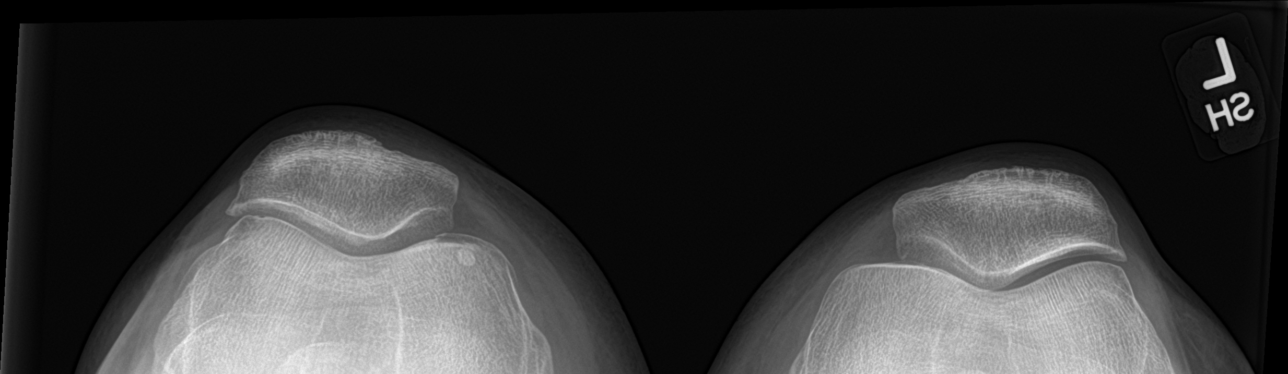

[knee ap bilat standing (1 of 2)]
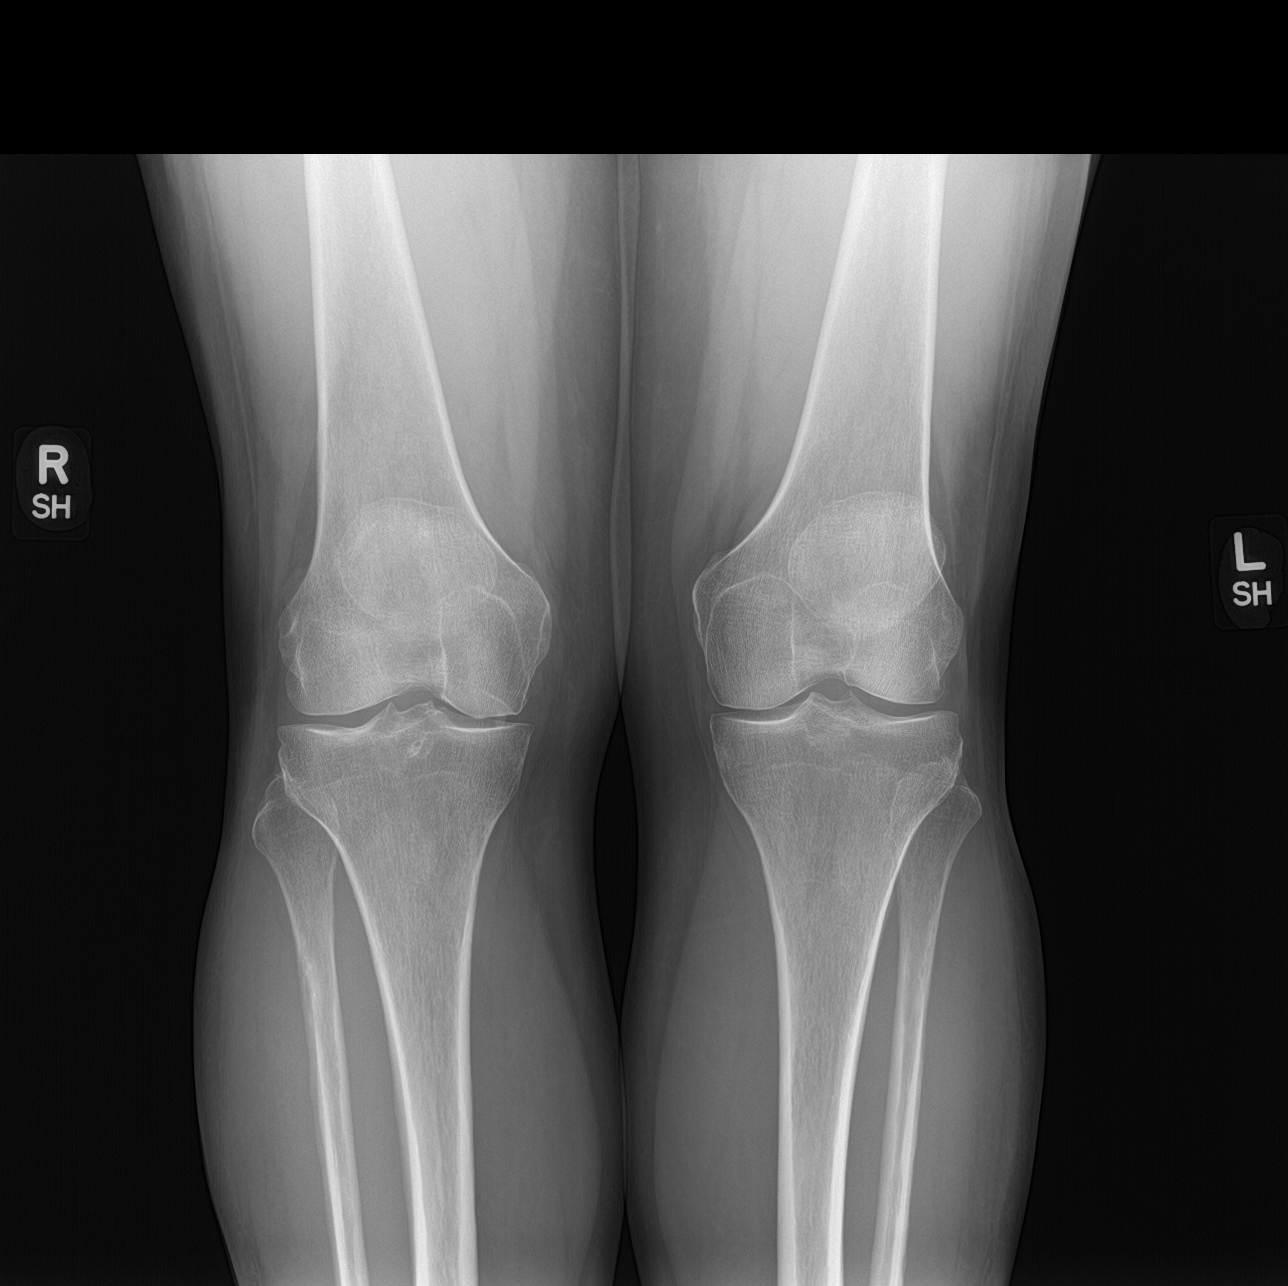

[knee ap bilat standing (2 of 2)]
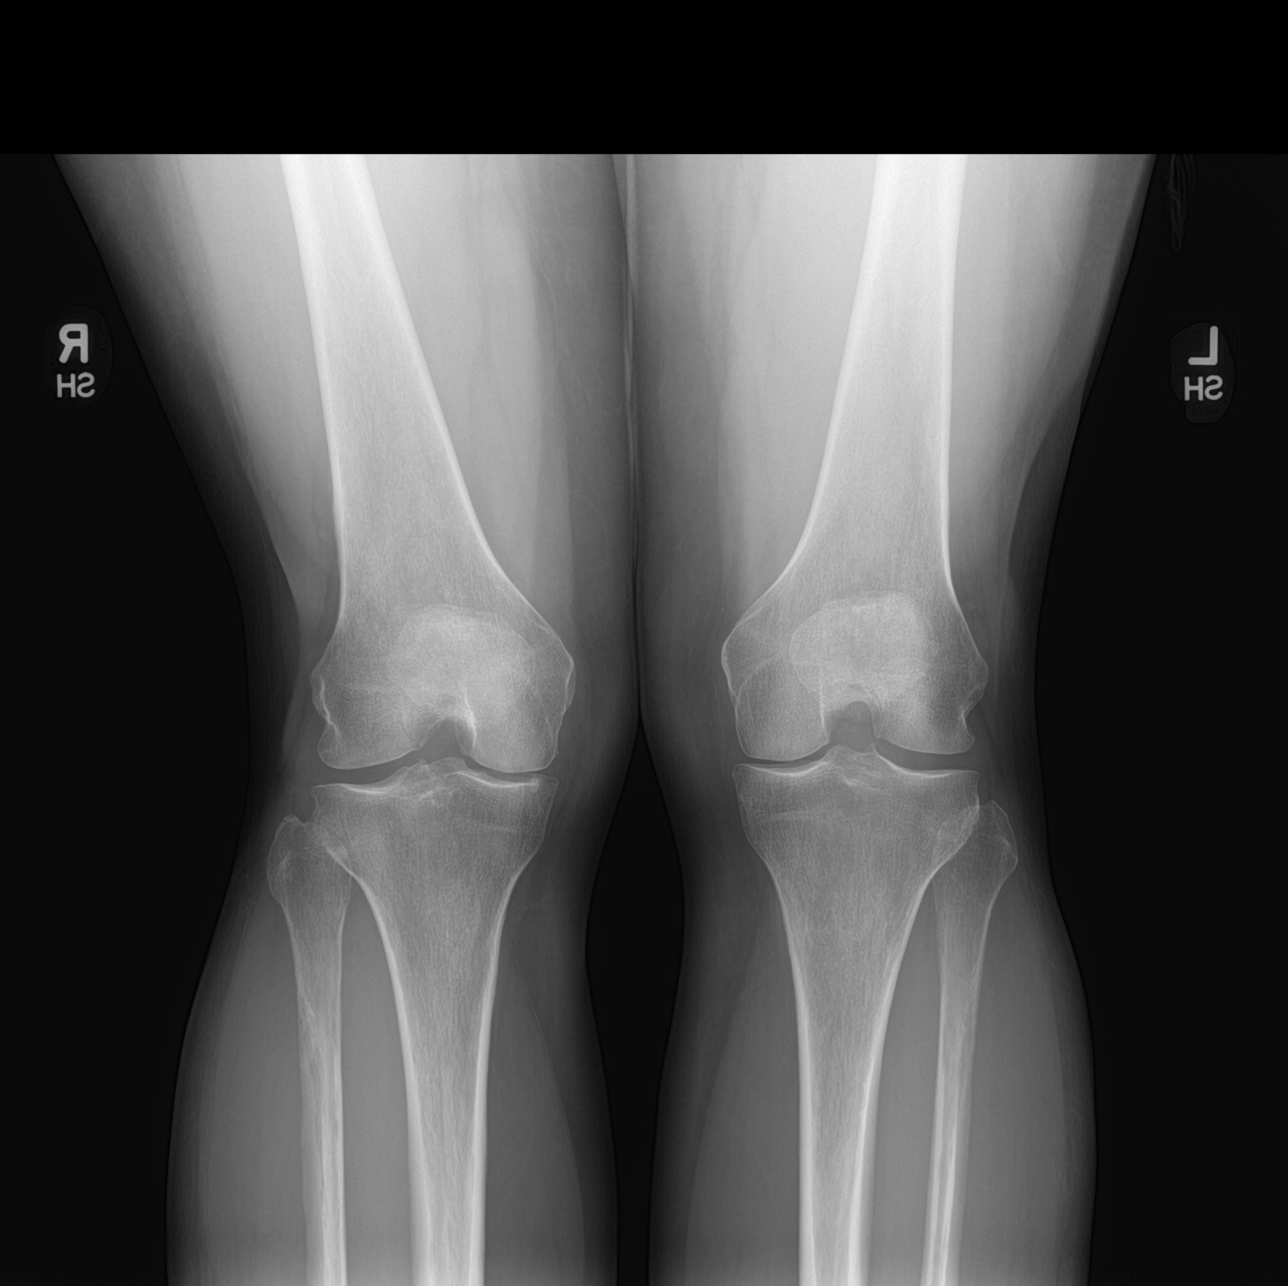

[4 of 4 positions shown; findings below may reference images not displayed]

FINDINGS: Standing frontal, standing tunnel, lateral, and sunrise patellar
images were obtained. No fracture or dislocation. No appreciable
joint effusion. There is moderate narrowing medially. There is
slight narrowing in the patellofemoral joint. No erosive changes.
There is a small spur along the anterior superior patella.
IMPRESSION: There is a degree of osteoarthritic change, primarily medially but
also with slight narrowing of the patellofemoral joint. No fracture
or dislocation. No evident joint effusion. Small spur along the
anterior superior patella may represent a degree of distal
quadriceps tendinosis.

## 2020-11-21 ENCOUNTER — Other Ambulatory Visit: Payer: Self-pay | Admitting: Rheumatology

## 2020-11-22 NOTE — Telephone Encounter (Signed)
Last Visit: 07/08/2020   Next Visit: 12/09/2020   Labs: 07/06/2020 CMP WNL. RBC count is borderline low-3.66.  MCV and MCH are borderline elevated butstable.  Rest of CBC WNL.    Eye exam: 01/01/2020    Current Dose per office note 07/08/2020: Plaquenil 200 mg 1 tablet by mouth twice daily Monday through Friday   [id number] syndrome with keratoconjunctivitis sicca   Last Fill: 09/17/2020  Okay to refill PLQ?

## 2020-11-25 NOTE — Progress Notes (Signed)
Office Visit Note  Patient: Heidi Carney             Date of Birth: 1965-05-02           MRN: [id number]             PCP: Lavada Mesi Referring: Donella Stade, PA-C Visit Date: 12/09/2020 Occupation: '@GUAROCC'$ @  Subjective:  Sicca symptoms   History of Present Illness: Heidi Carney is a 55 y.o. female with history of Sjogren's syndrome and osteoarthritis.  Patient is currently taking Plaquenil 200 mg 1 tablet by mouth twice daily Monday through Friday.  She continues to tolerate Plaquenil without any side effects and has not missed any doses recently.  She has not been experiencing any increased joint pain or joint swelling.  She has found Plaquenil to be very effective at managing her inflammatory arthritis.  She would like a permanent handicap placard to use when she is having increased joint pain.  She continues to have ongoing sicca symptoms.  She has been using ointment in her eyes at night has been helpful as well as Biotene products as needed for symptomatic relief.  She uses lozenges while at choir practice which are helpful.  She continues to see her dentist every 6 months and her eye doctor on a yearly basis. She is currently taking a course of augmentin for the management of sinusitis.     Activities of Daily Living:  Patient reports morning stiffness for 0 minutes.   Patient Reports nocturnal pain.  Difficulty dressing/grooming: Denies Difficulty climbing stairs: Denies Difficulty getting out of chair: Denies Difficulty using hands for taps, buttons, cutlery, and/or writing: Denies  Review of Systems  Constitutional:  Positive for fatigue.  HENT:  Positive for mouth dryness and nose dryness. Negative for mouth sores.   Eyes:  Positive for dryness. Negative for pain and itching.  Respiratory:  Negative for shortness of breath and difficulty breathing.   Cardiovascular:  Negative for chest pain and palpitations.  Gastrointestinal:  Positive for  constipation. Negative for blood in stool and diarrhea.  Endocrine: Negative for increased urination.  Genitourinary:  Negative for difficulty urinating.  Musculoskeletal:  Negative for joint pain, joint pain, joint swelling, myalgias, morning stiffness, muscle tenderness and myalgias.  Skin:  Negative for color change, rash and redness.  Allergic/Immunologic: Negative for susceptible to infections.  Neurological:  Negative for dizziness, numbness, headaches, memory loss and weakness.  Hematological:  Negative for bruising/bleeding tendency.  Psychiatric/Behavioral:  Negative for confusion.    PMFS History:  Patient Active Problem List   Diagnosis Date Noted   Sulfite allergy 05/21/2020   Adverse reaction to food, subsequent encounter 03/30/2020   Haglund's deformity of left heel 02/06/2020   Stress 02/06/2020   Itching 02/06/2020   Sjogren's syndrome with keratoconjunctivitis sicca ([place]) 04/30/2018   Family history of psoriatic arthritis 04/08/2018   Family history of morphea 04/08/2018   Allergic urticaria 02/06/2018   Angioedema 02/06/2018   Food intolerance 02/06/2018   Chronic rhinitis 02/06/2018   Increased pressure in the eye, left 01/02/2018   Colon polyp 12/18/2017   Grade I internal hemorrhoids 12/18/2017   Bouchard's nodes 12/17/2017   Joint stiffness 12/17/2017   Primary osteoarthritis of both hands 12/17/2017   Surgical 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 replacement therapy 12/17/2017   Multiple food allergies 07/12/2017   Nausea 07/07/2017   Intractable cyclical vomiting with nausea 07/07/2017   Diarrhea 07/07/2017   Abdominal cramping 07/07/2017   Allergic reaction 07/07/2017  Change in stool 02/12/2017   Left lower quadrant pain 01/24/2017   Slow transit constipation 01/24/2017   Right ovarian cyst 01/24/2017   Essential hypertension 01/09/2017   Migraine headache without aura 07/10/2016   Hair loss 11/16/2015   RLS (restless legs syndrome) 11/16/2015   B12 deficiency  11/16/2015   Vitamin D insufficiency 11/16/2015   Perimenopausal symptoms 09/13/2015   Overweight 03/17/2015   Seborrheic dermatitis 03/10/2015   GAD (generalized anxiety disorder) 01/26/2015   Elevated blood pressure reading 01/26/2015   Thyroid activity decreased 01/26/2015    Past Medical History:  Diagnosis Date   Anxiety    Arthritis    H/O total vaginal hysterectomy 05/2017   Hypothyroidism    Sjogren's disease ([place])     Family History  Problem Relation Age of Onset   Diabetes Father    Cancer Father        prostate CA   Stroke Father    Glaucoma Mother    Asthma Sister    Psoriasis Sister        psoriatic arthritis   Arthritis Sister    Scleroderma Sister    Eczema Sister    Asthma Sister    Healthy Daughter    Angioedema Neg Hx    Immunodeficiency Neg Hx    Urticaria Neg Hx    Past Surgical History:  Procedure Laterality Date   CESAREAN SECTION  2012   EYE SURGERY     LAPAROSCOPIC TOTAL HYSTERECTOMY  05/2017   Social History   Social History Narrative   Not on file   Immunization History  Administered Date(s) Administered   Influenza Inj Mdck Quad Pf 01/08/2019   Influenza, Quadrivalent, Recombinant, Inj, Pf 12/21/2016, 03/15/2018   Influenza,inj,Quad PF,6+ Mos 03/17/2015, 02/02/2016   PFIZER(Purple Top)SARS-COV-2 Vaccination 05/30/2019, 06/27/2019, 01/09/2020     Objective: Vital Signs: BP (!) 142/82 (BP Location: Left Arm, Patient Position: Sitting, Cuff Size: Normal)   Pulse 93   Ht '5\' 4"'$  (1.626 m)   Wt 161 lb 12.8 oz (73.4 kg)   LMP 01/21/2015   BMI 27.77 kg/m    Physical Exam Vitals and nursing note reviewed.  Constitutional:      Appearance: She is well-developed.  HENT:     Head: Normocephalic and atraumatic.     Mouth/Throat:     Comments: No parotid swelling or tenderness noted Eyes:     Conjunctiva/sclera: Conjunctivae normal.  Cardiovascular:     Rate and Rhythm: Normal rate and regular rhythm.     Heart sounds: Normal  heart sounds.  Pulmonary:     Effort: Pulmonary effort is normal.     Breath sounds: Normal breath sounds.  Abdominal:     General: Bowel sounds are normal.     Palpations: Abdomen is soft.  Musculoskeletal:     Cervical back: Normal range of motion.  Lymphadenopathy:     Cervical: No cervical adenopathy.  Skin:    General: Skin is warm and dry.     Capillary Refill: Capillary refill takes less than 2 seconds.  Neurological:     Mental Status: She is alert and oriented to person, place, and time.  Psychiatric:        Behavior: Behavior normal.     Musculoskeletal Exam: C-spine, thoracic spine, lumbar spine good range of motion with no discomfort.  Shoulder joints, elbow joints, wrist joints, MCPs, PIPs, DIPs good range of motion with no synovitis.  PIP and DIP thickening consistent with osteoarthritis of both hands.  Complete  fist formation bilaterally.  Hip joints have good range of motion with no discomfort.  Knee joints have good range of motion with no warmth or effusion.  Ankle joints have good range of motion with no tenderness or joint swelling.  CDAI Exam: CDAI Score: -- Patient Global: --; Provider Global: -- Swollen: --; Tender: -- Joint Exam 12/09/2020   No joint exam has been documented for this visit   There is currently no information documented on the homunculus. Go to the Rheumatology activity and complete the homunculus joint exam.  Investigation: No additional findings.  Imaging: No results found.  Recent Labs: Lab Results  Component Value Date   WBC 5.4 07/06/2020   HGB 12.8 07/06/2020   PLT 164 07/06/2020   NA 139 07/06/2020   K 4.5 07/06/2020   CL 103 07/06/2020   CO2 29 07/06/2020   GLUCOSE 73 07/06/2020   BUN 10 07/06/2020   CREATININE 0.89 07/06/2020   BILITOT 0.4 07/06/2020   ALKPHOS 87 02/06/2018   AST 22 07/06/2020   ALT 21 07/06/2020   PROT 7.1 07/06/2020   ALBUMIN 4.4 02/06/2018   CALCIUM 9.1 07/06/2020   GFRAA 86 07/06/2020     Speciality Comments: PLQ Eye Exam 01/01/2020, Normal @ [facility]   Procedures:  No procedures performed Allergies: Shrimp [shellfish allergy], Celexa [citalopram hydrobromide], Effexor [venlafaxine], Elemental sulfur, Macrobid [nitrofurantoin], Sulfites, and Synthroid [levothyroxine sodium]  Handicap placard  PLQ eye exam    Assessment / Plan:     Visit Diagnoses: Sjogren's syndrome with keratoconjunctivitis sicca ([facility]) - Sicca symptoms with positive ANA and positive Ro: She is not experiencing any signs or symptoms of a flare currently.  She has clinically been doing well taking Plaquenil 200 mg 1 tablet by mouth twice daily Monday through Friday.  She has no signs of inflammatory arthritis at this time.  No synovitis was noted on examination today.  She continues to have ongoing sicca symptoms and has been using over-the-counter products for symptomatic relief.  We discussed the importance of proper oral hygiene.  She continues to see the dentist every 6 months.  She has been seeing the ophthalmologist on a yearly basis.  She was strongly encouraged to schedule an appointment to update her Plaquenil eye exam.  She has not been experiencing any shortness of breath or pleuritic chest pain.  Her lungs were clear to auscultation on examination today.  No cervical lymphadenopathy was noted.  No parotid swelling or tenderness noted.  She is due to update the following lab work today.  Orders were released.  She was advised to notify us if she develops any new or worsening symptoms.  She will follow-up in the office in 5 months.- Plan: CBC with Differential/Platelet, COMPLETE METABOLIC PANEL WITH GFR, Urinalysis, Routine w reflex microscopic, Serum protein electrophoresis with reflex, Rheumatoid factor, ANA, Sedimentation rate, Sjogrens syndrome-A extractable nuclear antibody, Sjogrens syndrome-B extractable nuclear antibody  High risk medication use - Plaquenil 200 mg 1  tablet by mouth twice daily Monday through Friday.  CBC and CMP drawn on 07/06/2020.  She is due to update lab work.  Orders for CBC and CMP were released.  She will continue to require lab work every 5 months to monitor for drug toxicity.  PLQ Eye Exam 01/01/2020, Normal @ [facility].  She was given a Plaquenil eye exam to take with her to her upcoming appointment.  Plan: CBC with Differential/Platelet, COMPLETE METABOLIC PANEL WITH GFR  Primary osteoarthritis of both hands:  She has PIP and DIP thickening consistent with osteoarthritis of both hands.  No tenderness or inflammation was noted on examination today.  No signs of inflammatory Tritus noted.  Discussed the importance of joint protection and muscle strengthening.  Primary osteoarthritis of both feet - Evaluated by Dr. Jacqualyn Posey.  She is not having any increased discomfort in her feet at this time.  Ankle joints have good range of motion with no tenderness or inflammation.  No tenderness over MTP joints. She has occasional discomfort in her lower back, knee joints, and feet but has no inflammation on examination today.  She requested a renewal for handicap placard to use when needed.  Other medical conditions are listed as follows:   Vitamin D insufficiency  Seborrheic dermatitis  Increased pressure in the eye, left: She is not a good candidate for pilocarpine.  Essential hypertension  GAD (generalized anxiety disorder)  RLS (restless legs syndrome)  Migraine without aura and without status migrainosus, not intractable  Allergic urticaria  Other fatigue  History of hypothyroidism  Vitamin B12 deficiency  Family history of psoriatic arthritis  Family history of morphea  Orders: Orders Placed This Encounter  Procedures   CBC with Differential/Platelet   COMPLETE METABOLIC PANEL WITH GFR   Urinalysis, Routine w reflex microscopic   Serum protein electrophoresis with reflex   Rheumatoid factor    ANA   Sedimentation rate   Sjogrens syndrome-A extractable nuclear antibody   Sjogrens syndrome-B extractable nuclear antibody   No orders of the defined types were placed in this encounter.    Follow-Up Instructions: Return in about 5 months (around 05/11/2021) for Sjogren's syndrome, Osteoarthritis.   Ofilia Neas, PA-C  Note - This record has been created using Dragon software.  Chart creation errors have been sought, but may not always  have been located. Such creation errors do not reflect on  the standard of medical care.

## 2020-12-06 ENCOUNTER — Encounter: Payer: Self-pay | Admitting: Physician Assistant

## 2020-12-08 ENCOUNTER — Telehealth (INDEPENDENT_AMBULATORY_CARE_PROVIDER_SITE_OTHER): Payer: BC Managed Care – PPO | Admitting: Osteopathic Medicine

## 2020-12-08 ENCOUNTER — Encounter: Payer: Self-pay | Admitting: Osteopathic Medicine

## 2020-12-08 VITALS — Temp 98.3°F | Wt 158.0 lb

## 2020-12-08 DIAGNOSIS — J329 Chronic sinusitis, unspecified: Secondary | ICD-10-CM

## 2020-12-08 MED ORDER — AMOXICILLIN-POT CLAVULANATE 500-125 MG PO TABS: 1.0000 | ORAL_TABLET | Freq: Two times a day (BID) | ORAL | 0 refills | Status: DC

## 2020-12-08 MED ORDER — IPRATROPIUM BROMIDE 0.06 % NA SOLN: 2.0000 | Freq: Four times a day (QID) | NASAL | 1 refills | Status: AC

## 2020-12-08 NOTE — Progress Notes (Signed)
Telemedicine Visit via  Video & Audio (App used: [id number])   I connected with Heidi Carney on 12/08/20 at 4:10 PM  by phone or  telemedicine application as noted above  I verified that I am speaking with or regarding  the correct patient using two identifiers.  Participants: Myself, Dr Emeterio Reeve DO Patient: Heidi Carney Patient proxy if applicable: none Other, if applicable: none  Patient is at home I am in office at [facility]    I discussed the limitations of evaluation and management  by telemedicine and the availability of in person appointments.  The participant(s) above expressed understanding and  agreed to proceed with this appointment via telemedicine.       History of Present Illness: Heidi Carney is a 55 y.o. female who would like to discuss sinus concerns. Ongoing almost 2 weeks at this point.        Observations/Objective: Temp 98.3 F (36.8 C)   Wt 158 lb (71.7 kg)   LMP 01/21/2015   BMI 27.12 kg/m  BP Readings from Last 3 Encounters:  12/09/20 (!) 142/82  10/29/20 135/83  07/08/20 134/85   Exam: Normal Speech.    Lab and Radiology Results No results found for this or any previous visit (from the past 72 hour(s)). No results found.     Assessment and Plan: 55 y.o. female with The encounter diagnosis was Sinusitis, unspecified chronicity, unspecified location.   PDMP not reviewed this encounter. No orders of the defined types were placed in this encounter.  Meds ordered this encounter  Medications   amoxicillin-clavulanate (AUGMENTIN) 500-125 MG tablet    Sig: Take 1 tablet (500 mg total) by mouth 2 (two) times daily. For 7 days.    Dispense:  14 tablet    Refill:  0   ipratropium (ATROVENT) 0.06 % nasal spray    Sig: Place 2 sprays into both nostrils 4 (four) times daily. As needed for runny nose / postnasal drip    Dispense:  15 mL    Refill:  1   There are no Patient Instructions on file for this  visit.  Instructions sent via MyChart.   Follow Up Instructions: Return if symptoms worsen or fail to improve.    I discussed the assessment and treatment plan with the patient. The patient was provided an opportunity to ask questions and all were answered. The patient agreed with the plan and demonstrated an understanding of the instructions.   The patient was advised to call back or seek an in-person evaluation if any new concerns, if symptoms worsen or if the condition fails to improve as anticipated.  10 minutes of non-face-to-face time was provided during this encounter.      . . . . . . . . . . . . . Marland Kitchen                   Historical information moved to improve visibility of documentation.  Past Medical History:  Diagnosis Date   Anxiety    Arthritis    H/O total vaginal hysterectomy 05/2017   Hypothyroidism    Sjogren's disease [facility])    Past Surgical History:  Procedure Laterality Date   CESAREAN SECTION  2012   EYE SURGERY     LAPAROSCOPIC TOTAL HYSTERECTOMY  05/2017   Social History   Tobacco Use   Smoking status: Never   Smokeless tobacco: Never  Substance Use Topics   Alcohol use: Not Currently   family history includes Arthritis  in her sister; Asthma in her sister and sister; Cancer in her father; Diabetes in her father; Eczema in her sister; Glaucoma in her mother; Healthy in her daughter; Psoriasis in her sister; Scleroderma in her sister; Stroke in her father.  Medications: Current Outpatient Medications  Medication Sig Dispense Refill   cyanocobalamin (,VITAMIN B-12,) 1000 MCG/ML injection TAKE 1ML ONCE A MONTH. (Patient not taking: Reported on 12/09/2020) 3 mL 1   diclofenac sodium (VOLTAREN) 1 % GEL Apply 4 g topically 4 (four) times daily. To affected joint. 100 g 1   EPINEPHrine (AUVI-Q) 0.3 mg/0.3 mL IJ SOAJ injection Use as directed for severe allergic reactions. 2 each 1   ipratropium (ATROVENT) 0.06 % nasal spray  Place 2 sprays into both nostrils 4 (four) times daily. As needed for runny nose / postnasal drip 15 mL 1   Levothyroxine Sodium (TIROSINT) 75 MCG CAPS TAKE 1 CAPSULE (75 MCG TOTAL) BY MOUTH DAILY BEFORE BREAKFAST. 90 capsule 3   MOLYBDENUM PO Take by mouth as needed.     PLAQUENIL 200 MG tablet TAKE 1 TABLET 200 MG TWICE A DAY MONDAY-FRIDAY 120 tablet 0   venlafaxine XR (EFFEXOR-XR) 150 MG 24 hr capsule Take 1 capsule (150 mg total) by mouth daily with breakfast. 90 capsule 3   amoxicillin-clavulanate (AUGMENTIN) 500-125 MG tablet Take 1 tablet (500 mg total) by mouth 2 (two) times daily. For 7 days. 14 tablet 0   No current facility-administered medications for this visit.   Allergies  Allergen Reactions   Shrimp [Shellfish Allergy]     Lip and tongue swelling.    Celexa [Citalopram Hydrobromide]     Made feel anxiety worse   Effexor [Venlafaxine]     Blurry vision   Elemental Sulfur     Possible reaction with lip tingling/SOB/itching   Macrobid [Nitrofurantoin]     Bloated/itchy like a sulfite reaction   Sulfites    Synthroid [Levothyroxine Sodium]     Facial itching     If phone visit, billing and coding can please add appropriate modifier if needed

## 2020-12-09 ENCOUNTER — Ambulatory Visit: Payer: BC Managed Care – PPO | Admitting: Physician Assistant

## 2020-12-09 ENCOUNTER — Encounter: Payer: Self-pay | Admitting: Physician Assistant

## 2020-12-09 ENCOUNTER — Other Ambulatory Visit: Payer: Self-pay

## 2020-12-09 VITALS — BP 142/82 | HR 93 | Ht 64.0 in | Wt 161.8 lb

## 2020-12-09 DIAGNOSIS — F411 Generalized anxiety disorder: Secondary | ICD-10-CM

## 2020-12-09 DIAGNOSIS — E538 Deficiency of other specified B group vitamins: Secondary | ICD-10-CM

## 2020-12-09 DIAGNOSIS — R5383 Other fatigue: Secondary | ICD-10-CM

## 2020-12-09 DIAGNOSIS — G43009 Migraine without aura, not intractable, without status migrainosus: Secondary | ICD-10-CM

## 2020-12-09 DIAGNOSIS — E559 Vitamin D deficiency, unspecified: Secondary | ICD-10-CM

## 2020-12-09 DIAGNOSIS — Z840 Family history of diseases of the skin and subcutaneous tissue: Secondary | ICD-10-CM

## 2020-12-09 DIAGNOSIS — M19041 Primary osteoarthritis, right hand: Secondary | ICD-10-CM | POA: Diagnosis not present

## 2020-12-09 DIAGNOSIS — M79671 Pain in right foot: Secondary | ICD-10-CM

## 2020-12-09 DIAGNOSIS — M3501 Sicca syndrome with keratoconjunctivitis: Secondary | ICD-10-CM

## 2020-12-09 DIAGNOSIS — Z8261 Family history of arthritis: Secondary | ICD-10-CM

## 2020-12-09 DIAGNOSIS — M19042 Primary osteoarthritis, left hand: Secondary | ICD-10-CM

## 2020-12-09 DIAGNOSIS — G2581 Restless legs syndrome: Secondary | ICD-10-CM

## 2020-12-09 DIAGNOSIS — L500 Allergic urticaria: Secondary | ICD-10-CM

## 2020-12-09 DIAGNOSIS — M19071 Primary osteoarthritis, right ankle and foot: Secondary | ICD-10-CM

## 2020-12-09 DIAGNOSIS — M19072 Primary osteoarthritis, left ankle and foot: Secondary | ICD-10-CM

## 2020-12-09 DIAGNOSIS — Z8639 Personal history of other endocrine, nutritional and metabolic disease: Secondary | ICD-10-CM

## 2020-12-09 DIAGNOSIS — I10 Essential (primary) hypertension: Secondary | ICD-10-CM

## 2020-12-09 DIAGNOSIS — Z79899 Other long term (current) drug therapy: Secondary | ICD-10-CM

## 2020-12-09 DIAGNOSIS — L219 Seborrheic dermatitis, unspecified: Secondary | ICD-10-CM

## 2020-12-09 DIAGNOSIS — H40052 Ocular hypertension, left eye: Secondary | ICD-10-CM

## 2020-12-13 NOTE — Progress Notes (Signed)
CBC stable. Creatinine is borderline elevated-1.04-likely due to slight dehydration.  GFR is WNL.  We will continue to monitor.  UA normal.   SPEP did not reveal any abnormal proteins.   Ro antibody remains positive. La antibody and RF are negative.  ESR is elevated.

## 2020-12-14 LAB — COMPLETE METABOLIC PANEL WITH GFR
AG Ratio: 1.3 (calc) (ref 1.0–2.5)
ALT: 20 U/L (ref 6–29)
AST: 23 U/L (ref 10–35)
Albumin: 4.3 g/dL (ref 3.6–5.1)
Alkaline phosphatase (APISO): 81 U/L (ref 37–153)
BUN/Creatinine Ratio: 10 (calc) (ref 6–22)
BUN: 10 mg/dL (ref 7–25)
CO2: 30 mmol/L (ref 20–32)
Calcium: 9.3 mg/dL (ref 8.6–10.4)
Chloride: 101 mmol/L (ref 98–110)
Creat: 1.04 mg/dL — ABNORMAL HIGH (ref 0.50–1.03)
Globulin: 3.2 g/dL (calc) (ref 1.9–3.7)
Glucose, Bld: 95 mg/dL (ref 65–99)
Potassium: 3.8 mmol/L (ref 3.5–5.3)
Sodium: 139 mmol/L (ref 135–146)
Total Bilirubin: 0.4 mg/dL (ref 0.2–1.2)
Total Protein: 7.5 g/dL (ref 6.1–8.1)
eGFR: 64 mL/min/{1.73_m2} (ref >=60)

## 2020-12-14 LAB — URINALYSIS, ROUTINE W REFLEX MICROSCOPIC
Bilirubin Urine: NEGATIVE
Glucose, UA: NEGATIVE
Hgb urine dipstick: NEGATIVE
Ketones, ur: NEGATIVE
Leukocytes,Ua: NEGATIVE
Nitrite: NEGATIVE
Protein, ur: NEGATIVE
Specific Gravity, Urine: 1.003 (ref 1.001–1.035)
pH: 6 (ref 5.0–8.0)

## 2020-12-14 LAB — PROTEIN ELECTROPHORESIS, SERUM, WITH REFLEX
Albumin ELP: 4.1 g/dL (ref 3.8–4.8)
Alpha 1: 0.3 g/dL (ref 0.2–0.3)
Alpha 2: 0.8 g/dL (ref 0.5–0.9)
Beta 2: 0.5 g/dL (ref 0.2–0.5)
Beta Globulin: 0.5 g/dL (ref 0.4–0.6)
Gamma Globulin: 1.2 g/dL (ref 0.8–1.7)
Total Protein: 7.5 g/dL (ref 6.1–8.1)

## 2020-12-14 LAB — SJOGRENS SYNDROME-B EXTRACTABLE NUCLEAR ANTIBODY: SSB (La) (ENA) Antibody, IgG: <1 AI

## 2020-12-14 LAB — SEDIMENTATION RATE: Sed Rate: 60 mm/h — ABNORMAL HIGH (ref 0–30)

## 2020-12-14 LAB — CBC WITH DIFFERENTIAL/PLATELET
Absolute Monocytes: 662 cells/uL (ref 200–950)
Basophils Absolute: 31 cells/uL (ref 0–200)
Basophils Relative: 0.4 %
Eosinophils Absolute: 169 cells/uL (ref 15–500)
Eosinophils Relative: 2.2 %
HCT: 39.7 % (ref 35.0–45.0)
Hemoglobin: 13.3 g/dL (ref 11.7–15.5)
Lymphs Abs: 1840 cells/uL (ref 850–3900)
MCH: 34.4 pg — ABNORMAL HIGH (ref 27.0–33.0)
MCHC: 33.5 g/dL (ref 32.0–36.0)
MCV: 102.6 fL — ABNORMAL HIGH (ref 80.0–100.0)
MPV: 10.1 fL (ref 7.5–12.5)
Monocytes Relative: 8.6 %
Neutro Abs: 4997 cells/uL (ref 1500–7800)
Neutrophils Relative %: 64.9 %
Platelets: 189 10*3/uL (ref 140–400)
RBC: 3.87 10*6/uL (ref 3.80–5.10)
RDW: 11.8 % (ref 11.0–15.0)
Total Lymphocyte: 23.9 %
WBC: 7.7 10*3/uL (ref 3.8–10.8)

## 2020-12-14 LAB — ANTI-NUCLEAR AB-TITER (ANA TITER): ANA Titer 1: 1:320 {titer} — ABNORMAL HIGH

## 2020-12-14 LAB — RHEUMATOID FACTOR: Rheumatoid fact SerPl-aCnc: <14 IU/mL (ref <14)

## 2020-12-14 LAB — ANA: Anti Nuclear Antibody (ANA): POSITIVE — AB

## 2020-12-14 LAB — SJOGRENS SYNDROME-A EXTRACTABLE NUCLEAR ANTIBODY: SSA (Ro) (ENA) Antibody, IgG: >8 AI — AB

## 2020-12-15 ENCOUNTER — Telehealth: Payer: Self-pay | Admitting: *Deleted

## 2020-12-15 DIAGNOSIS — M3501 Sicca syndrome with keratoconjunctivitis: Secondary | ICD-10-CM

## 2020-12-15 NOTE — Progress Notes (Signed)
ANA remains positive, titer is unchanged.    Recommend rechecking ESR in 1 month.

## 2020-12-15 NOTE — Telephone Encounter (Signed)
-----  Message from Ofilia Neas, PA-C sent at 12/15/2020 12:02 PM EDT ----- ANA remains positive, titer is unchanged.    Recommend rechecking ESR in 1 month.

## 2021-01-12 ENCOUNTER — Telehealth: Payer: Self-pay

## 2021-01-12 ENCOUNTER — Other Ambulatory Visit: Payer: Self-pay | Admitting: *Deleted

## 2021-01-12 DIAGNOSIS — M3501 Sicca syndrome with keratoconjunctivitis: Secondary | ICD-10-CM

## 2021-01-12 NOTE — Telephone Encounter (Signed)
Patient called requesting her labwork orders be faxed to [facility] in [place].  Patient is going this afternoon, 01/11/21.    Fax [phone]

## 2021-01-12 NOTE — Telephone Encounter (Signed)
Lab Orders released.  

## 2021-01-13 LAB — SEDIMENTATION RATE: Sed Rate: 17 mm/h (ref 0–30)

## 2021-01-14 NOTE — Progress Notes (Signed)
ESR WNL

## 2021-01-26 ENCOUNTER — Ambulatory Visit: Payer: BC Managed Care – PPO | Admitting: Physician Assistant

## 2021-01-26 ENCOUNTER — Encounter: Payer: Self-pay | Admitting: Physician Assistant

## 2021-01-26 VITALS — BP 132/76 | HR 88 | Ht 64.0 in | Wt 156.0 lb

## 2021-01-26 DIAGNOSIS — F439 Reaction to severe stress, unspecified: Secondary | ICD-10-CM

## 2021-01-26 DIAGNOSIS — E538 Deficiency of other specified B group vitamins: Secondary | ICD-10-CM

## 2021-01-26 DIAGNOSIS — F411 Generalized anxiety disorder: Secondary | ICD-10-CM | POA: Diagnosis not present

## 2021-01-26 DIAGNOSIS — Z79899 Other long term (current) drug therapy: Secondary | ICD-10-CM

## 2021-01-26 DIAGNOSIS — E039 Hypothyroidism, unspecified: Secondary | ICD-10-CM

## 2021-01-26 DIAGNOSIS — R252 Cramp and spasm: Secondary | ICD-10-CM | POA: Diagnosis not present

## 2021-01-26 DIAGNOSIS — E559 Vitamin D deficiency, unspecified: Secondary | ICD-10-CM

## 2021-01-26 MED ORDER — VENLAFAXINE HCL ER 150 MG PO CP24: 150.0000 mg | ORAL_CAPSULE | Freq: Every day | ORAL | 3 refills | Status: DC

## 2021-01-26 NOTE — Progress Notes (Signed)
Subjective:    Patient ID: Heidi Carney, female    DOB: 1965-07-04, 55 y.o.   MRN: [id number]  HPI Pt is a 55 yo female with sulfite allergy, hypothyroidism, vitamin D def, b12 def, GAD who presents to the clinic for follow up.   She is on lots of supplementation and wants to make sure she is taking the right amt. She is taking a new supplement molybdenum and she wants to make sure all her labs look good.   Overall doing better than she has in the past. She is still limited in what she can eat.   Her mood is controlled.   .. Active Ambulatory Problems    Diagnosis Date Noted   GAD (generalized anxiety disorder) 01/26/2015   Elevated blood pressure reading 01/26/2015   Thyroid activity decreased 01/26/2015   Seborrheic dermatitis 03/10/2015   Overweight 03/17/2015   Perimenopausal symptoms 09/13/2015   Hair loss 11/16/2015   RLS (restless legs syndrome) 11/16/2015   B12 deficiency 11/16/2015   Vitamin D insufficiency 11/16/2015   Migraine headache without aura 07/10/2016   Essential hypertension 01/09/2017   Left lower quadrant pain 01/24/2017   Slow transit constipation 01/24/2017   Right ovarian cyst 01/24/2017   Change in stool 02/12/2017   Nausea 07/07/2017   Intractable cyclical vomiting with nausea 07/07/2017   Diarrhea 07/07/2017   Abdominal cramping 07/07/2017   Allergic reaction 07/07/2017   Multiple food allergies 07/12/2017   Bouchard's nodes 12/17/2017   Joint stiffness 12/17/2017   Primary osteoarthritis of both hands 12/17/2017   Surgical 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 replacement therapy 12/17/2017   Colon polyp 12/18/2017   Grade I internal hemorrhoids 12/18/2017   Increased pressure in the eye, left 01/02/2018   Allergic urticaria 02/06/2018   Angioedema 02/06/2018   Food intolerance 02/06/2018   Chronic rhinitis 02/06/2018   Family history of psoriatic arthritis 04/08/2018   Family history of morphea 04/08/2018   Sjogren's syndrome with  keratoconjunctivitis sicca ([place]) 04/30/2018   Haglund's deformity of left heel 02/06/2020   Stress 02/06/2020   Itching 02/06/2020   Adverse reaction to food, subsequent encounter 03/30/2020   Sulfite allergy 05/21/2020   Resolved Ambulatory Problems    Diagnosis Date Noted   Atypical chest pain 01/26/2015   Benign paroxysmal positional vertigo 01/09/2017   Past Medical History:  Diagnosis Date   Anxiety    Arthritis    H/O total vaginal hysterectomy 05/2017   Hypothyroidism    Sjogren's disease ([place])      Review of Systems See HPI.     Objective:   Physical Exam Vitals reviewed.  Constitutional:      Appearance: Normal appearance.  HENT:     Head: Normocephalic.  Neck:     Vascular: No carotid bruit.  Cardiovascular:     Rate and Rhythm: Normal rate and regular rhythm.     Pulses: Normal pulses.     Heart sounds: Normal heart sounds.  Pulmonary:     Effort: Pulmonary effort is normal.     Breath sounds: Normal breath sounds.  Musculoskeletal:     Cervical back: No tenderness.     Right lower leg: No edema.     Left lower leg: No edema.  Lymphadenopathy:     Cervical: No cervical adenopathy.  Neurological:     General: No focal deficit present.     Mental Status: She is alert and oriented to person, place, and time.  Psychiatric:        Mood  and Affect: Mood normal.      .. Depression screen [facility] 2/9 05/21/2020 10/30/2018 03/15/2018 12/14/2017 07/05/2017  Decreased Interest 0 0 1 0 0  Down, Depressed, Hopeless 0 0 2 1 0  PHQ - 2 Score 0 0 3 1 0  Altered sleeping 0 0 2 1 -  Tired, decreased energy 1 0 2 1 -  Change in appetite 0 0 2 1 -  Feeling bad or failure about yourself  0 1 1 - -  Trouble concentrating 0 0 3 0 -  Moving slowly or fidgety/restless 0 0 0 0 -  Suicidal thoughts 0 0 0 0 -  PHQ-9 Score 1 1 13 4  -  Difficult doing work/chores Not difficult at all Not difficult at all Somewhat difficult Somewhat difficult -   .. GAD 7 : Generalized  Anxiety Score 05/21/2020 10/30/2018 03/15/2018 12/14/2017  Nervous, Anxious, on Edge 0 1 3 2   Control/stop worrying 0 0 0 0  Worry too much - different things 0 0 3 2  Trouble relaxing 0 0 2 0  Restless 1 0 2 1  Easily annoyed or irritable 0 1 3 2   Afraid - awful might happen 0 0 0 0  Total GAD 7 Score 1 2 13 7   Anxiety Difficulty Not difficult at all Not difficult at all Somewhat difficult Somewhat difficult        Assessment & Plan:  Marland KitchenMarland KitchenJanye was seen today for follow-up.  Diagnoses and all orders for this visit:  B12 deficiency -     Vitamin B12 -     Uric acid -     COMPLETE METABOLIC PANEL WITH GFR  Vitamin D insufficiency -     Vitamin D (25 hydroxy) -     Uric acid -     COMPLETE METABOLIC PANEL WITH GFR  Muscle cramping -     Vitamin B12 -     Vitamin D (25 hydroxy) -     Uric acid -     COMPLETE METABOLIC PANEL WITH GFR  Stress -     Uric acid -     venlafaxine XR (EFFEXOR-XR) 150 MG 24 hr capsule; Take 1 capsule (150 mg total) by mouth daily with breakfast.  GAD (generalized anxiety disorder) -     Uric acid -     COMPLETE METABOLIC PANEL WITH GFR -     venlafaxine XR (EFFEXOR-XR) 150 MG 24 hr capsule; Take 1 capsule (150 mg total) by mouth daily with breakfast.  Acquired hypothyroidism -     Uric acid -     COMPLETE METABOLIC PANEL WITH GFR -     TSH  Medication management -     Uric acid  PHQ/GAD numbers look great. Refilled effexor.   Ordered TSH to refill tirosint.   Labs ordered to follow up on vitamin supplementation.   Declines covid boosters and flu shot today.

## 2021-01-27 ENCOUNTER — Encounter: Payer: Self-pay | Admitting: Physician Assistant

## 2021-01-27 LAB — COMPLETE METABOLIC PANEL WITH GFR
AG Ratio: 1.4 (calc) (ref 1.0–2.5)
ALT: 20 U/L (ref 6–29)
AST: 22 U/L (ref 10–35)
Albumin: 4.2 g/dL (ref 3.6–5.1)
Alkaline phosphatase (APISO): 71 U/L (ref 37–153)
BUN: 11 mg/dL (ref 7–25)
CO2: 31 mmol/L (ref 20–32)
Calcium: 9.1 mg/dL (ref 8.6–10.4)
Chloride: 103 mmol/L (ref 98–110)
Creat: 0.92 mg/dL (ref 0.50–1.03)
Globulin: 3 g/dL (calc) (ref 1.9–3.7)
Glucose, Bld: 86 mg/dL (ref 65–99)
Potassium: 4.2 mmol/L (ref 3.5–5.3)
Sodium: 140 mmol/L (ref 135–146)
Total Bilirubin: 0.4 mg/dL (ref 0.2–1.2)
Total Protein: 7.2 g/dL (ref 6.1–8.1)
eGFR: 74 mL/min/{1.73_m2} (ref >=60)

## 2021-01-27 LAB — VITAMIN D 25 HYDROXY (VIT D DEFICIENCY, FRACTURES): Vit D, 25-Hydroxy: 40 ng/mL (ref 30–100)

## 2021-01-27 LAB — VITAMIN B12: Vitamin B-12: 386 pg/mL (ref 200–1100)

## 2021-01-27 LAB — URIC ACID: Uric Acid, Serum: 4.4 mg/dL (ref 2.5–7.0)

## 2021-01-27 LAB — TSH: TSH: 3.52 mIU/L

## 2021-01-27 MED ORDER — LEVOTHYROXINE SODIUM 88 MCG PO CAPS
1.0000 | ORAL_CAPSULE | Freq: Every day | ORAL | 1 refills | Status: DC
Start: 2021-01-27 — End: 2021-02-11

## 2021-01-27 NOTE — Progress Notes (Signed)
Heidi Carney,   Your TSH in normal range but upper limits of normal. We could increase thyroid medication just a tad to see if we could push you to optimal levels of 1-2? Are you ok with increase?  Kidney, liver, glucose look great.  Uric acid normal.  Vitamin D normal range but ultimately would like for it to be above 50. How much are you taking? I do not have it on med list?  Are you doing your b12 shot once a month? B12 normal but low normal.   No signs of taking too much molybdenum. Continue how you are taking it.

## 2021-01-27 NOTE — Addendum Note (Signed)
Addended by: Donella Stade on: 01/27/2021 12:58 PM   Modules accepted: Orders

## 2021-02-11 ENCOUNTER — Other Ambulatory Visit: Payer: Self-pay | Admitting: Physician Assistant

## 2021-02-11 ENCOUNTER — Other Ambulatory Visit: Payer: Self-pay | Admitting: Neurology

## 2021-02-11 DIAGNOSIS — E039 Hypothyroidism, unspecified: Secondary | ICD-10-CM

## 2021-02-11 MED ORDER — TIROSINT 88 MCG PO CAPS
1.0000 | ORAL_CAPSULE | Freq: Every day | ORAL | 1 refills | Status: DC
Start: 2021-02-11 — End: 2021-03-29

## 2021-03-17 ENCOUNTER — Encounter: Payer: Self-pay | Admitting: Physician Assistant

## 2021-03-17 DIAGNOSIS — E039 Hypothyroidism, unspecified: Secondary | ICD-10-CM

## 2021-03-18 NOTE — Telephone Encounter (Signed)
Patient also left vm about this, sent mychart message back.

## 2021-03-25 LAB — TSH: TSH: 0.55 mIU/L

## 2021-03-25 NOTE — Progress Notes (Signed)
Normal range but borderline HYPER thyroid range. How do you feel?

## 2021-03-25 NOTE — Progress Notes (Signed)
There is no dose in between 46mcg and 91mcg therefore we would have to do some time of combination either alternating dosages or something else I would like to go down by 45mg  a week. So that would be 3 days of 38mcg and 4 days of 3mcg. Does that make sense?

## 2021-03-29 MED ORDER — TIROSINT 88 MCG PO CAPS: 1.0000 | ORAL_CAPSULE | Freq: Every day | ORAL | 1 refills | Status: DC

## 2021-03-29 NOTE — Addendum Note (Signed)
Addended byAnnamaria Helling on: 03/29/2021 01:48 PM   Modules accepted: Orders

## 2021-03-29 NOTE — Telephone Encounter (Signed)
Patient made aware of instructions for 75 and 88 mcg of medication. Sent 88 mcg- she has enough of other dosage. She will recheck labs in 6 weeks. Lab ordered.

## 2021-04-26 ENCOUNTER — Encounter: Payer: Self-pay | Admitting: Physician Assistant

## 2021-04-26 DIAGNOSIS — E538 Deficiency of other specified B group vitamins: Secondary | ICD-10-CM

## 2021-04-26 MED ORDER — CYANOCOBALAMIN 1000 MCG/ML IJ SOLN: 1000.0000 ug | INTRAMUSCULAR | 1 refills | Status: DC

## 2021-05-05 NOTE — Progress Notes (Signed)
Office Visit Note  Patient: Heidi Carney             Date of Birth: 1965-11-11           MRN: [id number]             PCP: Lavada Mesi Referring: Donella Stade, PA-C Visit Date: 05/18/2021 Occupation: @GUAROCC @  Subjective:  Medication management  History of Present Illness: Heidi Carney is a 56 y.o. female with a history of Sjogren's and osteoarthritis.  She states she continues to have dry mouth and dry eyes.  She has been using over-the-counter products which has been helpful.  She continues to have pain and discomfort in her bilateral hands.  She has difficulty opening bottles and jars.  She also has decreased grip strength.  She has been experiencing increased fatigue.  Activities of Daily Living:  Patient reports morning stiffness for 0 minute.   Patient Denies nocturnal pain.  Difficulty dressing/grooming: Denies Difficulty climbing stairs: Denies Difficulty getting out of chair: Denies Difficulty using hands for taps, buttons, cutlery, and/or writing: Denies  Review of Systems  Constitutional:  Positive for fatigue. Negative for night sweats and weight loss.  HENT:  Positive for mouth dryness and nose dryness. Negative for mouth sores, trouble swallowing and trouble swallowing.   Eyes:  Positive for dryness. Negative for pain, redness and visual disturbance.  Respiratory:  Negative for cough, shortness of breath and difficulty breathing.   Cardiovascular:  Negative for chest pain, palpitations, hypertension, irregular heartbeat and swelling in legs/feet.  Gastrointestinal:  Positive for constipation. Negative for blood in stool and diarrhea.  Endocrine: Negative for increased urination.  Genitourinary:  Negative for vaginal dryness.  Musculoskeletal:  Negative for joint pain, joint pain, joint swelling, myalgias, muscle weakness, morning stiffness, muscle tenderness and myalgias.  Skin:  Negative for color change, rash, hair loss, skin tightness, ulcers  and sensitivity to sunlight.  Allergic/Immunologic: Negative for susceptible to infections.  Neurological:  Negative for dizziness, memory loss, night sweats and weakness.  Hematological:  Negative for swollen glands.  Psychiatric/Behavioral:  Negative for depressed mood and sleep disturbance. The patient is nervous/anxious.    PMFS History:  Patient Active Problem List   Diagnosis Date Noted   Sulfite allergy 05/21/2020   Adverse reaction to food, subsequent encounter 03/30/2020   Haglund's deformity of left heel 02/06/2020   Stress 02/06/2020   Itching 02/06/2020   Sjogren's syndrome with keratoconjunctivitis sicca ([place]) 04/30/2018   Family history of psoriatic arthritis 04/08/2018   Family history of morphea 04/08/2018   Allergic urticaria 02/06/2018   Angioedema 02/06/2018   Food intolerance 02/06/2018   Chronic rhinitis 02/06/2018   Increased pressure in the eye, left 01/02/2018   Colon polyp 12/18/2017   Grade I internal hemorrhoids 12/18/2017   Bouchard's nodes 12/17/2017   Joint stiffness 12/17/2017   Primary osteoarthritis of both hands 12/17/2017   Surgical 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 replacement therapy 12/17/2017   Multiple food allergies 07/12/2017   Nausea 07/07/2017   Intractable cyclical vomiting with nausea 07/07/2017   Diarrhea 07/07/2017   Abdominal cramping 07/07/2017   Allergic reaction 07/07/2017   Change in stool 02/12/2017   Left lower quadrant pain 01/24/2017   Slow transit constipation 01/24/2017   Right ovarian cyst 01/24/2017   Essential hypertension 01/09/2017   Migraine headache without aura 07/10/2016   Hair loss 11/16/2015   RLS (restless legs syndrome) 11/16/2015   B12 deficiency 11/16/2015   Vitamin D insufficiency 11/16/2015   Perimenopausal  symptoms 09/13/2015   Overweight 03/17/2015   Seborrheic dermatitis 03/10/2015   GAD (generalized anxiety disorder) 01/26/2015   Elevated blood pressure reading 01/26/2015   Thyroid activity decreased  01/26/2015    Past Medical History:  Diagnosis Date   Anxiety    Arthritis    H/O total vaginal hysterectomy 05/2017   Hypothyroidism    Sjogren's disease ([place])     Family History  Problem Relation Age of Onset   Diabetes Father    Cancer Father        prostate CA   Stroke Father    Glaucoma Mother    Asthma Sister    Psoriasis Sister        psoriatic arthritis   Arthritis Sister    Scleroderma Sister    Eczema Sister    Asthma Sister    Healthy Daughter    Angioedema Neg Hx    Immunodeficiency Neg Hx    Urticaria Neg Hx    Past Surgical History:  Procedure Laterality Date   CESAREAN SECTION  2012   EYE SURGERY     LAPAROSCOPIC TOTAL HYSTERECTOMY  05/2017   Social History   Social History Narrative   Not on file   Immunization History  Administered Date(s) Administered   Influenza Inj Mdck Quad Pf 01/08/2019   Influenza, Quadrivalent, Recombinant, Inj, Pf 12/21/2016, 03/15/2018   Influenza,inj,Quad PF,6+ Mos 03/17/2015, 02/02/2016   PFIZER(Purple Top)SARS-COV-2 Vaccination 05/30/2019, 06/27/2019, 01/09/2020     Objective: Vital Signs: BP 129/75 (BP Location: Left Arm, Patient Position: Sitting, Cuff Size: Normal)    Pulse 75    Resp 15    Ht 5\' 4"  (1.626 m)    Wt 153 lb 12.8 oz (69.8 kg)    LMP 01/21/2015    BMI 26.40 kg/m    Physical Exam Vitals and nursing note reviewed.  Constitutional:      Appearance: She is well-developed.  HENT:     Head: Normocephalic and atraumatic.  Eyes:     Conjunctiva/sclera: Conjunctivae normal.  Cardiovascular:     Rate and Rhythm: Normal rate and regular rhythm.     Heart sounds: Normal heart sounds.  Pulmonary:     Effort: Pulmonary effort is normal.     Breath sounds: Normal breath sounds.  Abdominal:     General: Bowel sounds are normal.     Palpations: Abdomen is soft.  Musculoskeletal:     Cervical back: Normal range of motion.  Lymphadenopathy:     Cervical: No cervical adenopathy.  Skin:    General: Skin  is warm and dry.     Capillary Refill: Capillary refill takes less than 2 seconds.  Neurological:     Mental Status: She is alert and oriented to person, place, and time.  Psychiatric:        Behavior: Behavior normal.     Musculoskeletal Exam: C-spine was in good range of motion.  Shoulder joints, elbow joints, wrist joints with good range of motion.  She had bilateral PIP and DIP thickening.  No synovitis was noted.  Hip joints and knee joints in good range of motion.  She had no tenderness over ankles or MTPs.  CDAI Exam: CDAI Score: -- Patient Global: --; Provider Global: -- Swollen: --; Tender: -- Joint Exam 05/18/2021   No joint exam has been documented for this visit   There is currently no information documented on the homunculus. Go to the Rheumatology activity and complete the homunculus joint exam.  Investigation: No additional findings.  Imaging: No results found.  Recent Labs: Lab Results  Component Value Date   WBC 7.7 12/09/2020   HGB 13.3 12/09/2020   PLT 189 12/09/2020   NA 140 01/26/2021   K 4.2 01/26/2021   CL 103 01/26/2021   CO2 31 01/26/2021   GLUCOSE 86 01/26/2021   BUN 11 01/26/2021   CREATININE 0.92 01/26/2021   BILITOT 0.4 01/26/2021   ALKPHOS 87 02/06/2018   AST 22 01/26/2021   ALT 20 01/26/2021   PROT 7.2 01/26/2021   ALBUMIN 4.4 02/06/2018   CALCIUM 9.1 01/26/2021   GFRAA 86 07/06/2020    Speciality Comments: PLQ Eye Exam 01/01/2020, Normal @ [facility]   Procedures:  No procedures performed Allergies: Shrimp [shellfish allergy], Celexa [citalopram hydrobromide], Effexor [venlafaxine], Elemental sulfur, Macrobid [nitrofurantoin], Sulfites, and Synthroid [levothyroxine sodium]   Assessment / Plan:     Visit Diagnoses: Sjogren's syndrome with keratoconjunctivitis sicca ([facility]) - Sicca symptoms with positive ANA and positive Ro:  -She continues to have dry mouth and dry eyes.  Over-the-counter products were  discussed.  I will check autoimmune labs today.  Plan: Urinalysis, Routine w reflex microscopic, C3 and C4, Sedimentation rate, Anti-DNA antibody, double-stranded, Sjogrens syndrome-A extractable nuclear antibody.  Prescription refill for Plaquenil was given.  High risk medication use - Plaquenil 200 mg 1 tablet by mouth twice daily Monday through Friday. PLQ Eye Exam 01/01/2020, patient states that she had eye examination in December 2022.  We will request records.- Plan: CBC with Differential/Platelet, COMPLETE METABOLIC PANEL WITH GFR today.  Information on immunization was placed in the AVS.  Primary osteoarthritis of both hands-joint protection muscle strengthening was discussed.  She had bilateral PIP and DIP thickening.  Primary osteoarthritis of both feet - Evaluated by Dr. Jacqualyn Posey.  She has calluses under her feet.  Proper fitting shoes were discussed.  Seborrheic dermatitis  Vitamin D insufficiency  Postmenopausal  Osteoporosis screening-I will schedule DEXA scan as she is postmenopausal.  Use of calcium rich diet with vitamin D and exercise was emphasized.  Other fatigue -she had been experiencing increased fatigue.  Plan: Serum protein electrophoresis with reflex, TSH  Essential hypertension-her blood pressure was normal today.  Increased pressure in the eye, left -patient states that the pressure has been normal recently.  Migraine without aura and without status migrainosus, not intractable  RLS (restless legs syndrome)  History of hypothyroidism - Plan: TSH  Allergic urticaria  Vitamin B12 deficiency  GAD (generalized anxiety disorder)  Family history of morphea  Family history of psoriatic arthritis  Orders: Orders Placed This Encounter  Procedures   DG BONE DENSITY (DXA)   CBC with Differential/Platelet   COMPLETE METABOLIC PANEL WITH GFR   Urinalysis, Routine w reflex microscopic   C3 and C4   Sedimentation rate   Anti-DNA antibody, double-stranded    Sjogrens syndrome-A extractable nuclear antibody   Serum protein electrophoresis with reflex   TSH   Meds ordered this encounter  Medications   PLAQUENIL 200 MG tablet    Sig: TAKE 1 TABLET 200 MG TWICE A DAY MONDAY-FRIDAY    Dispense:  120 tablet    Refill:  0     Follow-Up Instructions: Return in about 5 months (around 10/15/2021) for Sjogren's, Osteoarthritis.   Bo Merino, MD  Note - This record has been created using Editor, commissioning.  Chart creation errors have been sought, but may not always  have been located. Such creation errors do not reflect on  the standard of medical  care.  °

## 2021-05-17 ENCOUNTER — Encounter: Payer: Self-pay | Admitting: Physician Assistant

## 2021-05-18 ENCOUNTER — Encounter: Payer: Self-pay | Admitting: Rheumatology

## 2021-05-18 ENCOUNTER — Ambulatory Visit (INDEPENDENT_AMBULATORY_CARE_PROVIDER_SITE_OTHER): Payer: BC Managed Care – PPO | Admitting: Rheumatology

## 2021-05-18 ENCOUNTER — Other Ambulatory Visit: Payer: Self-pay

## 2021-05-18 VITALS — BP 129/75 | HR 75 | Resp 15 | Ht 64.0 in | Wt 153.8 lb

## 2021-05-18 DIAGNOSIS — Z13820 Encounter for screening for osteoporosis: Secondary | ICD-10-CM

## 2021-05-18 DIAGNOSIS — Z79899 Other long term (current) drug therapy: Secondary | ICD-10-CM

## 2021-05-18 DIAGNOSIS — M19042 Primary osteoarthritis, left hand: Secondary | ICD-10-CM

## 2021-05-18 DIAGNOSIS — M3501 Sicca syndrome with keratoconjunctivitis: Secondary | ICD-10-CM | POA: Diagnosis not present

## 2021-05-18 DIAGNOSIS — Z840 Family history of diseases of the skin and subcutaneous tissue: Secondary | ICD-10-CM

## 2021-05-18 DIAGNOSIS — G2581 Restless legs syndrome: Secondary | ICD-10-CM

## 2021-05-18 DIAGNOSIS — F411 Generalized anxiety disorder: Secondary | ICD-10-CM

## 2021-05-18 DIAGNOSIS — I10 Essential (primary) hypertension: Secondary | ICD-10-CM

## 2021-05-18 DIAGNOSIS — M19041 Primary osteoarthritis, right hand: Secondary | ICD-10-CM

## 2021-05-18 DIAGNOSIS — M19072 Primary osteoarthritis, left ankle and foot: Secondary | ICD-10-CM

## 2021-05-18 DIAGNOSIS — E559 Vitamin D deficiency, unspecified: Secondary | ICD-10-CM

## 2021-05-18 DIAGNOSIS — H40052 Ocular hypertension, left eye: Secondary | ICD-10-CM

## 2021-05-18 DIAGNOSIS — M19071 Primary osteoarthritis, right ankle and foot: Secondary | ICD-10-CM

## 2021-05-18 DIAGNOSIS — Z780 Asymptomatic menopausal state: Secondary | ICD-10-CM

## 2021-05-18 DIAGNOSIS — L500 Allergic urticaria: Secondary | ICD-10-CM

## 2021-05-18 DIAGNOSIS — Z8639 Personal history of other endocrine, nutritional and metabolic disease: Secondary | ICD-10-CM

## 2021-05-18 DIAGNOSIS — L219 Seborrheic dermatitis, unspecified: Secondary | ICD-10-CM

## 2021-05-18 DIAGNOSIS — G43009 Migraine without aura, not intractable, without status migrainosus: Secondary | ICD-10-CM

## 2021-05-18 DIAGNOSIS — E538 Deficiency of other specified B group vitamins: Secondary | ICD-10-CM

## 2021-05-18 DIAGNOSIS — R5383 Other fatigue: Secondary | ICD-10-CM

## 2021-05-18 MED ORDER — PLAQUENIL 200 MG PO TABS: ORAL_TABLET | ORAL | 0 refills | Status: DC

## 2021-05-18 NOTE — Patient Instructions (Signed)
Vaccines You are taking a medication(s) that can suppress your immune system.  The following immunizations are recommended: Flu annually Covid-19  Td/Tdap (tetanus, diphtheria, pertussis) every 10 years Pneumonia (Prevnar 15 then Pneumovax 23 at least 1 year apart.  Alternatively, can take Prevnar 20 without needing additional dose) Shingrix: 2 doses from 4 weeks to 6 months apart  Please check with your PCP to make sure you are up to date.   Heart Disease Prevention   Your inflammatory disease increases your risk of heart disease which includes heart attack, stroke, atrial fibrillation (irregular heartbeats), high blood pressure, heart failure and atherosclerosis (plaque in the arteries).  It is important to reduce your risk by:   Keep blood pressure, cholesterol, and blood sugar at healthy levels   Smoking Cessation   Maintain a healthy weight  BMI 20-25   Eat a healthy diet  Plenty of fresh fruit, vegetables, and whole grains  Limit saturated fats, foods high in sodium, and added sugars  DASH and Mediterranean diet   Increase physical activity  Recommend moderate physically activity for 150 minutes per week/ 30 minutes a day for five days a week These can be broken up into three separate ten-minute sessions during the day.   Reduce Stress  Meditation, slow breathing exercises, yoga, coloring books  Dental visits twice a year   

## 2021-05-20 ENCOUNTER — Other Ambulatory Visit: Payer: Self-pay | Admitting: Physician Assistant

## 2021-05-20 DIAGNOSIS — E039 Hypothyroidism, unspecified: Secondary | ICD-10-CM

## 2021-05-20 LAB — COMPLETE METABOLIC PANEL WITH GFR
AG Ratio: 1.6 (calc) (ref 1.0–2.5)
ALT: 22 U/L (ref 6–29)
AST: 23 U/L (ref 10–35)
Albumin: 4.4 g/dL (ref 3.6–5.1)
Alkaline phosphatase (APISO): 76 U/L (ref 37–153)
BUN: 12 mg/dL (ref 7–25)
CO2: 33 mmol/L — ABNORMAL HIGH (ref 20–32)
Calcium: 9.1 mg/dL (ref 8.6–10.4)
Chloride: 102 mmol/L (ref 98–110)
Creat: 1.03 mg/dL (ref 0.50–1.03)
Globulin: 2.8 g/dL (calc) (ref 1.9–3.7)
Glucose, Bld: 86 mg/dL (ref 65–99)
Potassium: 4 mmol/L (ref 3.5–5.3)
Sodium: 141 mmol/L (ref 135–146)
Total Bilirubin: 0.5 mg/dL (ref 0.2–1.2)
Total Protein: 7.2 g/dL (ref 6.1–8.1)
eGFR: 65 mL/min/{1.73_m2} (ref >=60)

## 2021-05-20 LAB — CBC WITH DIFFERENTIAL/PLATELET
Absolute Monocytes: 498 cells/uL (ref 200–950)
Basophils Absolute: 28 cells/uL (ref 0–200)
Basophils Relative: 0.6 %
Eosinophils Absolute: 169 cells/uL (ref 15–500)
Eosinophils Relative: 3.6 %
HCT: 39.8 % (ref 35.0–45.0)
Hemoglobin: 13.4 g/dL (ref 11.7–15.5)
Lymphs Abs: 1734 cells/uL (ref 850–3900)
MCH: 35.2 pg — ABNORMAL HIGH (ref 27.0–33.0)
MCHC: 33.7 g/dL (ref 32.0–36.0)
MCV: 104.5 fL — ABNORMAL HIGH (ref 80.0–100.0)
MPV: 10.7 fL (ref 7.5–12.5)
Monocytes Relative: 10.6 %
Neutro Abs: 2270 cells/uL (ref 1500–7800)
Neutrophils Relative %: 48.3 %
Platelets: 161 10*3/uL (ref 140–400)
RBC: 3.81 10*6/uL (ref 3.80–5.10)
RDW: 11.8 % (ref 11.0–15.0)
Total Lymphocyte: 36.9 %
WBC: 4.7 10*3/uL (ref 3.8–10.8)

## 2021-05-20 LAB — ANTI-DNA ANTIBODY, DOUBLE-STRANDED: ds DNA Ab: <1 IU/mL

## 2021-05-20 LAB — URINALYSIS, ROUTINE W REFLEX MICROSCOPIC
Bacteria, UA: NONE SEEN /HPF
Bilirubin Urine: NEGATIVE
Glucose, UA: NEGATIVE
Hgb urine dipstick: NEGATIVE
Hyaline Cast: NONE SEEN /LPF
Ketones, ur: NEGATIVE
Nitrite: NEGATIVE
Protein, ur: NEGATIVE
RBC / HPF: NONE SEEN /HPF (ref 0–2)
Specific Gravity, Urine: 1.008 (ref 1.001–1.035)
Squamous Epithelial / HPF: NONE SEEN /HPF (ref <=5)
pH: 6 (ref 5.0–8.0)

## 2021-05-20 LAB — TEST AUTHORIZATION

## 2021-05-20 LAB — PROTEIN ELECTROPHORESIS, SERUM, WITH REFLEX
Albumin ELP: 4 g/dL (ref 3.8–4.8)
Alpha 1: 0.3 g/dL (ref 0.2–0.3)
Alpha 2: 0.7 g/dL (ref 0.5–0.9)
Beta 2: 0.5 g/dL (ref 0.2–0.5)
Beta Globulin: 0.5 g/dL (ref 0.4–0.6)
Gamma Globulin: 1.2 g/dL (ref 0.8–1.7)
Total Protein: 7.1 g/dL (ref 6.1–8.1)

## 2021-05-20 LAB — C3 AND C4
C3 Complement: 135 mg/dL (ref 83–193)
C4 Complement: 30 mg/dL (ref 15–57)

## 2021-05-20 LAB — B12 AND FOLATE PANEL
Folate: 20.3 ng/mL
Vitamin B-12: 1206 pg/mL — ABNORMAL HIGH (ref 200–1100)

## 2021-05-20 LAB — SEDIMENTATION RATE: Sed Rate: 28 mm/h (ref 0–30)

## 2021-05-20 LAB — MICROSCOPIC MESSAGE

## 2021-05-20 LAB — SJOGRENS SYNDROME-A EXTRACTABLE NUCLEAR ANTIBODY: SSA (Ro) (ENA) Antibody, IgG: >8 AI — AB

## 2021-05-20 LAB — TSH: TSH: 1.06 mIU/L

## 2021-05-20 MED ORDER — TIROSINT 88 MCG PO CAPS: 1.0000 | ORAL_CAPSULE | Freq: Every day | ORAL | 1 refills | Status: DC

## 2021-06-01 MED ORDER — TIROSINT 88 MCG PO CAPS
1.0000 | ORAL_CAPSULE | Freq: Every day | ORAL | 1 refills | Status: DC
Start: 2021-06-01 — End: 2021-08-26

## 2021-06-20 ENCOUNTER — Other Ambulatory Visit: Payer: Self-pay | Admitting: Rheumatology

## 2021-06-23 ENCOUNTER — Encounter: Payer: Self-pay | Admitting: Rheumatology

## 2021-07-02 ENCOUNTER — Other Ambulatory Visit: Payer: Self-pay | Admitting: Physician Assistant

## 2021-07-02 DIAGNOSIS — E538 Deficiency of other specified B group vitamins: Secondary | ICD-10-CM

## 2021-07-21 ENCOUNTER — Encounter: Payer: Self-pay | Admitting: Physician Assistant

## 2021-07-21 DIAGNOSIS — E538 Deficiency of other specified B group vitamins: Secondary | ICD-10-CM

## 2021-07-21 DIAGNOSIS — E039 Hypothyroidism, unspecified: Secondary | ICD-10-CM

## 2021-07-21 DIAGNOSIS — Z13220 Encounter for screening for lipoid disorders: Secondary | ICD-10-CM

## 2021-07-21 DIAGNOSIS — Z0000 Encounter for general adult medical examination without abnormal findings: Secondary | ICD-10-CM

## 2021-07-21 NOTE — Telephone Encounter (Signed)
Patient also left vm stating this.  ?

## 2021-07-27 ENCOUNTER — Encounter: Payer: Self-pay | Admitting: Physician Assistant

## 2021-07-27 ENCOUNTER — Ambulatory Visit (INDEPENDENT_AMBULATORY_CARE_PROVIDER_SITE_OTHER): Payer: BC Managed Care – PPO | Admitting: Physician Assistant

## 2021-07-27 VITALS — BP 132/81 | HR 90 | Ht 64.0 in | Wt 157.0 lb

## 2021-07-27 DIAGNOSIS — Z13820 Encounter for screening for osteoporosis: Secondary | ICD-10-CM | POA: Diagnosis not present

## 2021-07-27 DIAGNOSIS — Z1159 Encounter for screening for other viral diseases: Secondary | ICD-10-CM

## 2021-07-27 DIAGNOSIS — Z0000 Encounter for general adult medical examination without abnormal findings: Secondary | ICD-10-CM

## 2021-07-27 DIAGNOSIS — Z23 Encounter for immunization: Secondary | ICD-10-CM | POA: Diagnosis not present

## 2021-07-27 NOTE — Progress Notes (Addendum)
Complete physical exam  Patient: Heidi Carney   DOB: 01/25/1966   56 y.o. Female  MRN: [id number]  Subjective:    Chief Complaint  Patient presents with   Annual Exam    Heidi Carney is a 56 y.o. female who presents today for a complete physical exam. She reports consuming a  Pt has sulfate allergy which she generally has strict adherence to  diet.  She loves to walk.   She generally feels fairly well. She reports sleeping well. She does not have additional problems to discuss today.     Most recent depression screenings:    05/21/2020    9:01 AM 10/30/2018   10:40 AM  PHQ 2/9 Scores  PHQ - 2 Score 0 0  PHQ- 9 Score 1 1    Vision:Within last year and Dental: No current dental problems and Receives regular dental care  Patient Active Problem List   Diagnosis Date Noted   Sulfite allergy 05/21/2020   Adverse reaction to food, subsequent encounter 03/30/2020   Haglund's deformity of left heel 02/06/2020   Stress 02/06/2020   Itching 02/06/2020   Myopia with presbyopia of both eyes 05/17/2018   Sjogren's syndrome with keratoconjunctivitis sicca (HCC) 04/30/2018   Family history of psoriatic arthritis 04/08/2018   Family history of morphea 04/08/2018   Allergic urticaria 02/06/2018   Angioedema 02/06/2018   Food intolerance 02/06/2018   Chronic rhinitis 02/06/2018   Increased pressure in the eye, left 01/02/2018   Colon polyp 12/18/2017   Grade I internal hemorrhoids 12/18/2017   Bouchard's nodes 12/17/2017   Joint stiffness 12/17/2017   Primary osteoarthritis of both hands 12/17/2017   Surgical 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 replacement therapy 12/17/2017   Multiple food allergies 07/12/2017   Nausea 07/07/2017   Intractable cyclical vomiting with nausea 07/07/2017   Diarrhea 07/07/2017   Abdominal cramping 07/07/2017   Allergic reaction 07/07/2017   Ovarian mass 05/31/2017   Grade II hemorrhoids 04/05/2017   Change in stool 02/12/2017   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/06/2017   Left lower quadrant pain 01/24/2017   Slow transit constipation 01/24/2017   Right ovarian cyst 01/24/2017   Essential hypertension 01/09/2017   Migraine headache without aura 07/10/2016   Hair loss 11/16/2015   RLS (restless legs syndrome) 11/16/2015   B12 deficiency 11/16/2015   Vitamin D insufficiency 11/16/2015   Perimenopausal symptoms 09/13/2015   Overweight 03/17/2015   Seborrheic dermatitis 03/10/2015   GAD (generalized anxiety disorder) 01/26/2015   Elevated blood pressure reading 01/26/2015   Thyroid activity decreased 01/26/2015   Past Medical History:  Diagnosis Date   Anxiety    Arthritis    H/O total vaginal hysterectomy 05/2017   Hypothyroidism    Sjogren's disease (HCC)    Allergies  Allergen Reactions   Shrimp [Shellfish Allergy]     Lip and tongue swelling.    Celexa [Citalopram Hydrobromide]     Made feel anxiety worse   Effexor [Venlafaxine]     Blurry vision   Elemental Sulfur     Possible reaction with lip tingling/SOB/itching   Macrobid [Nitrofurantoin]     Bloated/itchy like a sulfite reaction   Sulfites    Synthroid [Levothyroxine Sodium]     Facial itching      Patient Care Team: Nolene Ebbs as PCP - General (Family Medicine)   Outpatient Medications Prior to Visit  Medication Sig   cyanocobalamin (,VITAMIN B-12,) 1000 MCG/ML injection Inject 1 mL (1,000 mcg total) into the muscle every  30 (thirty) days.   diclofenac sodium (VOLTAREN) 1 % GEL Apply 4 g topically 4 (four) times daily. To affected joint.   ipratropium (ATROVENT) 0.06 % nasal spray Place 2 sprays into both nostrils 4 (four) times daily. As needed for runny nose / postnasal drip   MOLYBDENUM PO Take 500 mcg by mouth in the morning, at noon, and at bedtime.   PLAQUENIL 200 MG tablet TAKE 1 TABLET 200 MG TWICE A DAY MONDAY-FRIDAY   TIROSINT 88 MCG CAPS Take 1 capsule (88 mcg total) by mouth daily before breakfast.   venlafaxine XR (EFFEXOR-XR) 150 MG 24  hr capsule Take 1 capsule (150 mg total) by mouth daily with breakfast.   EPINEPHrine (AUVI-Q) 0.3 mg/0.3 mL IJ SOAJ injection Use as directed for severe allergic reactions. (Patient not taking: Reported on 07/27/2021)   No facility-administered medications prior to visit.    ROS        Objective:     BP (!) 151/71   Pulse 90   Ht 5\' 4"  (1.626 m)   Wt 157 lb (71.2 kg)   LMP 01/21/2015   SpO2 97%   BMI 26.95 kg/m  BP Readings from Last 3 Encounters:  07/27/21 132/81  05/18/21 129/75  01/26/21 132/76   Wt Readings from Last 3 Encounters:  07/27/21 157 lb (71.2 kg)  05/18/21 153 lb 12.8 oz (69.8 kg)  01/26/21 156 lb (70.8 kg)      Physical Exam Vitals reviewed.  Constitutional:      Appearance: Normal appearance.  HENT:     Head: Normocephalic.     Right Ear: Tympanic membrane normal.     Left Ear: Tympanic membrane normal.     Nose: Nose normal.     Mouth/Throat:     Mouth: Mucous membranes are moist.     Pharynx: No oropharyngeal exudate or posterior oropharyngeal erythema.  Eyes:     General:        Right eye: No discharge.        Left eye: No discharge.     Extraocular Movements: Extraocular movements intact.     Conjunctiva/sclera: Conjunctivae normal.     Pupils: Pupils are equal, round, and reactive to light.  Neck:     Vascular: No carotid bruit.  Cardiovascular:     Rate and Rhythm: Normal rate and regular rhythm.     Pulses: Normal pulses.     Heart sounds: Normal heart sounds.  Pulmonary:     Effort: Pulmonary effort is normal.     Breath sounds: Normal breath sounds.  Abdominal:     General: Bowel sounds are normal. There is no distension.     Palpations: Abdomen is soft.     Tenderness: There is no abdominal tenderness. There is no right CVA tenderness or left CVA tenderness.  Musculoskeletal:     Cervical back: Neck supple. No rigidity or tenderness.     Right lower leg: No edema.     Left lower leg: No edema.  Lymphadenopathy:      Cervical: No cervical adenopathy.  Neurological:     General: No focal deficit present.     Mental Status: She is alert and oriented to person, place, and time.  Psychiatric:        Mood and Affect: Mood normal.    ..    07/27/2021    9:48 AM 05/21/2020    9:01 AM 10/30/2018   10:40 AM 03/15/2018   11:27 AM 12/14/2017  8:43 AM  Depression screen PHQ 2/9  Decreased Interest 0 0 0 1 0  Down, Depressed, Hopeless 0 0 0 2 1  PHQ - 2 Score 0 0 0 3 1  Altered sleeping 2 0 0 2 1  Tired, decreased energy 2 1 0 2 1  Change in appetite 0 0 0 2 1  Feeling bad or failure about yourself  1 0 1 1   Trouble concentrating 1 0 0 3 0  Moving slowly or fidgety/restless 0 0 0 0 0  Suicidal thoughts 0 0 0 0 0  PHQ-9 Score 6 1 1 13 4   Difficult doing work/chores Somewhat difficult Not difficult at all Not difficult at all Somewhat difficult Somewhat difficult   ..    07/27/2021    9:48 AM 05/21/2020    9:01 AM 10/30/2018   10:40 AM 03/15/2018   11:26 AM  GAD 7 : Generalized Anxiety Score  Nervous, Anxious, on Edge 1 0 1 3  Control/stop worrying 0 0 0 0  Worry too much - different things 1 0 0 3  Trouble relaxing 0 0 0 2  Restless 0 1 0 2  Easily annoyed or irritable 1 0 1 3  Afraid - awful might happen 0 0 0 0  Total GAD 7 Score 3 1 2 13   Anxiety Difficulty Somewhat difficult Not difficult at all Not difficult at all Somewhat difficult        Assessment & Plan:    Routine Health Maintenance and Physical Exam  Immunization History  Administered Date(s) Administered   Influenza Inj Mdck Quad Pf 01/08/2019   Influenza, Quadrivalent, Recombinant, Inj, Pf 12/21/2016, 03/15/2018   Influenza,inj,Quad PF,6+ Mos 03/17/2015, 02/02/2016   PFIZER(Purple Top)SARS-COV-2 Vaccination 05/30/2019, 06/27/2019, 01/09/2020    Health Maintenance  Topic Date Due   HIV Screening  Never done   Hepatitis C Screening  Never done   TETANUS/TDAP  02/01/2021   INFLUENZA VACCINE  11/01/2021   MAMMOGRAM   09/17/2022   COLONOSCOPY (Pts 45-26yrs Insurance coverage will need to be confirmed)  11/01/2027   HPV VACCINES  Aged Out   COVID-19 Vaccine  Discontinued   Zoster Vaccines- Shingrix  Discontinued    Discussed health benefits of physical activity, and encouraged her to engage in regular exercise appropriate for her age and condition.  Marland KitchenRinaldo Cloud was seen today for annual exam.  Diagnoses and all orders for this visit:  Routine physical examination  Encounter for hepatitis C screening test for low risk patient -     Hepatitis C Antibody  Osteoporosis screening -     DG Bone Density; Future  Need for Tdap vaccination -     Tdap vaccine greater than or equal to 7yo IM       No follow-ups on file.     Tandy Gaw, PA-C

## 2021-07-27 NOTE — Patient Instructions (Signed)

## 2021-07-28 LAB — LIPID PANEL W/REFLEX DIRECT LDL
Cholesterol: 181 mg/dL (ref <200)
HDL: 63 mg/dL (ref >=50)
LDL Cholesterol (Calc): 92 mg/dL (calc)
Non-HDL Cholesterol (Calc): 118 mg/dL (calc) (ref <130)
Total CHOL/HDL Ratio: 2.9 (calc) (ref <5.0)
Triglycerides: 154 mg/dL — ABNORMAL HIGH (ref <150)

## 2021-07-28 LAB — CBC WITH DIFFERENTIAL/PLATELET
Absolute Monocytes: 437 cells/uL (ref 200–950)
Basophils Absolute: 28 cells/uL (ref 0–200)
Basophils Relative: 0.6 %
Eosinophils Absolute: 197 cells/uL (ref 15–500)
Eosinophils Relative: 4.2 %
HCT: 40.5 % (ref 35.0–45.0)
Hemoglobin: 14.1 g/dL (ref 11.7–15.5)
Lymphs Abs: 1217 cells/uL (ref 850–3900)
MCH: 36.2 pg — ABNORMAL HIGH (ref 27.0–33.0)
MCHC: 34.8 g/dL (ref 32.0–36.0)
MCV: 103.8 fL — ABNORMAL HIGH (ref 80.0–100.0)
MPV: 10.4 fL (ref 7.5–12.5)
Monocytes Relative: 9.3 %
Neutro Abs: 2820 cells/uL (ref 1500–7800)
Neutrophils Relative %: 60 %
Platelets: 172 10*3/uL (ref 140–400)
RBC: 3.9 10*6/uL (ref 3.80–5.10)
RDW: 11.8 % (ref 11.0–15.0)
Total Lymphocyte: 25.9 %
WBC: 4.7 10*3/uL (ref 3.8–10.8)

## 2021-07-28 LAB — COMPLETE METABOLIC PANEL WITH GFR
AG Ratio: 1.3 (calc) (ref 1.0–2.5)
ALT: 22 U/L (ref 6–29)
AST: 23 U/L (ref 10–35)
Albumin: 4.3 g/dL (ref 3.6–5.1)
Alkaline phosphatase (APISO): 78 U/L (ref 37–153)
BUN: 8 mg/dL (ref 7–25)
CO2: 30 mmol/L (ref 20–32)
Calcium: 9.2 mg/dL (ref 8.6–10.4)
Chloride: 103 mmol/L (ref 98–110)
Creat: 0.9 mg/dL (ref 0.50–1.03)
Globulin: 3.2 g/dL (calc) (ref 1.9–3.7)
Glucose, Bld: 86 mg/dL (ref 65–99)
Potassium: 4.1 mmol/L (ref 3.5–5.3)
Sodium: 140 mmol/L (ref 135–146)
Total Bilirubin: 0.6 mg/dL (ref 0.2–1.2)
Total Protein: 7.5 g/dL (ref 6.1–8.1)
eGFR: 76 mL/min/{1.73_m2} (ref >=60)

## 2021-07-28 LAB — HEPATITIS C ANTIBODY
Hepatitis C Ab: NONREACTIVE
SIGNAL TO CUT-OFF: 0.15 (ref <1.00)

## 2021-07-28 NOTE — Progress Notes (Signed)
Labs look good.

## 2021-08-01 ENCOUNTER — Other Ambulatory Visit: Payer: Self-pay | Admitting: Rheumatology

## 2021-08-02 NOTE — Telephone Encounter (Signed)
Next Visit: 10/18/2021 ? ?Last Visit: 05/18/2021 ? ?Labs: 07/27/2021 MCV 103.8, MCH 36.2 ? ?Eye exam: 03/03/2021, Normal  ? ?Current Dose per office note 05/18/2021: Plaquenil 200 mg 1 tablet by mouth twice daily Monday through Friday ? ?WP:[id number]'L syndrome with keratoconjunctivitis sicca  ? ?Last Fill: 05/18/2021 ? ?Okay to refill Plaquenil?  ?

## 2021-08-03 ENCOUNTER — Encounter: Payer: Self-pay | Admitting: Physician Assistant

## 2021-08-03 ENCOUNTER — Ambulatory Visit (INDEPENDENT_AMBULATORY_CARE_PROVIDER_SITE_OTHER): Payer: BC Managed Care – PPO

## 2021-08-03 DIAGNOSIS — Z13820 Encounter for screening for osteoporosis: Secondary | ICD-10-CM

## 2021-08-03 DIAGNOSIS — Z780 Asymptomatic menopausal state: Secondary | ICD-10-CM

## 2021-08-03 DIAGNOSIS — M8580 Other specified disorders of bone density and structure, unspecified site: Secondary | ICD-10-CM | POA: Insufficient documentation

## 2021-08-03 NOTE — Progress Notes (Signed)
Osteopenic range: make sure taking vitamin D and calcium and include any low weigh bearing exercise. Recheck in 2 years.

## 2021-08-26 ENCOUNTER — Other Ambulatory Visit: Payer: Self-pay | Admitting: Physician Assistant

## 2021-09-26 ENCOUNTER — Encounter: Payer: Self-pay | Admitting: Physician Assistant

## 2021-09-26 DIAGNOSIS — E039 Hypothyroidism, unspecified: Secondary | ICD-10-CM

## 2021-09-29 MED ORDER — TIROSINT 88 MCG PO CAPS
ORAL_CAPSULE | ORAL | 1 refills | Status: DC
Start: 2021-09-29 — End: 2022-07-31

## 2021-09-29 MED ORDER — LEVOTHYROXINE SODIUM 75 MCG PO CAPS: ORAL_CAPSULE | ORAL | 0 refills | Status: DC

## 2021-10-03 MED ORDER — LEVOTHYROXINE SODIUM 75 MCG PO CAPS: ORAL_CAPSULE | ORAL | 0 refills | Status: DC

## 2021-10-03 NOTE — Addendum Note (Signed)
Addended by: Narda Rutherford on: 10/03/2021 07:38 AM   Modules accepted: Orders

## 2021-10-05 NOTE — Progress Notes (Unsigned)
Office Visit Note  Patient: Heidi Carney             Date of Birth: 1966-03-25           MRN: [id number]             PCP: Nolene Ebbs Referring: Jomarie Longs, PA-C Visit Date: 10/18/2021 Occupation: @GUAROCC @  Subjective:  Medication monitoring    History of Present Illness: Heidi Carney is a 56 y.o. female with history of sjogren's syndrome and osteoarthritis. She is taking plaquenil 200 mg 1 tablet by mouth twice daily Monday through Friday. She is tolerating plaquenil without any side effects.  She has intermittent pain in both hands exacerbated by overuse activities. She denies any joint swelling. She takes Advil as needed or uses Voltaren gel topically as needed for pain relief.  She continues to have chronic sicca symptoms.  Overall her symptoms have been tolerable and she has been trying to drink water throughout the day and occasionally uses cough drops or lotions for symptomatic relief.  She denies any parotid swelling or cervical lymphadenopathy.  She denies any shortness of breath or pleuritic chest pain. She denies any other new or worsening symptoms.    Activities of Daily Living:  Patient reports morning stiffness for 15 minutes.   Patient Reports nocturnal pain.  Difficulty dressing/grooming: Denies Difficulty climbing stairs: Denies Difficulty getting out of chair: Denies Difficulty using hands for taps, buttons, cutlery, and/or writing: Denies  Review of Systems  Constitutional:  Positive for fatigue.  HENT:  Positive for mouth dryness. Negative for mouth sores and nose dryness.   Eyes:  Positive for dryness. Negative for pain and visual disturbance.  Respiratory:  Negative for cough, hemoptysis, shortness of breath and difficulty breathing.   Cardiovascular:  Negative for chest pain, palpitations, hypertension and swelling in legs/feet.  Gastrointestinal:  Positive for constipation. Negative for blood in stool and diarrhea.  Endocrine:  Positive for cold intolerance and excessive thirst. Negative for increased urination.  Genitourinary:  Negative for difficulty urinating and painful urination.  Musculoskeletal:  Positive for joint pain, joint pain and morning stiffness. Negative for joint swelling, myalgias, muscle weakness, muscle tenderness and myalgias.  Skin:  Negative for color change, pallor, rash, hair loss, nodules/bumps, skin tightness, ulcers and sensitivity to sunlight.  Allergic/Immunologic: Negative for susceptible to infections.  Neurological:  Negative for dizziness, numbness, headaches and weakness.  Hematological:  Negative for bruising/bleeding tendency and swollen glands.  Psychiatric/Behavioral:  Positive for sleep disturbance. Negative for depressed mood. The patient is not nervous/anxious.     PMFS History:  Patient Active Problem List   Diagnosis Date Noted   Osteopenia 08/03/2021   Sulfite allergy 05/21/2020   Adverse reaction to food, subsequent encounter 03/30/2020   Haglund's deformity of left heel 02/06/2020   Stress 02/06/2020   Itching 02/06/2020   Myopia with presbyopia of both eyes 05/17/2018   Sjogren's syndrome with keratoconjunctivitis sicca (HCC) 04/30/2018   Family history of psoriatic arthritis 04/08/2018   Family history of morphea 04/08/2018   Allergic urticaria 02/06/2018   Angioedema 02/06/2018   Food intolerance 02/06/2018   Chronic rhinitis 02/06/2018   Increased pressure in the eye, left 01/02/2018   Colon polyp 12/18/2017   Grade I internal hemorrhoids 12/18/2017   Bouchard's nodes 12/17/2017   Joint stiffness 12/17/2017   Primary osteoarthritis of both hands 12/17/2017   Surgical 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 replacement therapy 12/17/2017   Multiple food allergies 07/12/2017   Nausea 07/07/2017  Intractable cyclical vomiting with nausea 07/07/2017   Diarrhea 07/07/2017   Abdominal cramping 07/07/2017   Allergic reaction 07/07/2017   Ovarian mass 05/31/2017   Grade II  hemorrhoids 04/05/2017   Change in stool 02/12/2017   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   Left lower quadrant pain 01/24/2017   Slow transit constipation 01/24/2017   Right ovarian cyst 01/24/2017   Essential hypertension 01/09/2017   Migraine headache without aura 07/10/2016   Hair loss 11/16/2015   RLS (restless legs syndrome) 11/16/2015   B12 deficiency 11/16/2015   Vitamin D insufficiency 11/16/2015   Perimenopausal symptoms 09/13/2015   Overweight 03/17/2015   Seborrheic dermatitis 03/10/2015   GAD (generalized anxiety disorder) 01/26/2015   Elevated blood pressure reading 01/26/2015   Thyroid activity decreased 01/26/2015    Past Medical History:  Diagnosis Date   Anxiety    Arthritis    H/O total vaginal hysterectomy 05/2017   Hypothyroidism    Sjogren's disease ([place])     Family History  Problem Relation Age of Onset   Diabetes Father    Cancer Father        prostate CA   Stroke Father    Glaucoma Mother    Asthma Sister    Psoriasis Sister        psoriatic arthritis   Arthritis Sister    Scleroderma Sister    Eczema Sister    Asthma Sister    Healthy Daughter    Angioedema Neg Hx    Immunodeficiency Neg Hx    Urticaria Neg Hx    Past Surgical History:  Procedure Laterality Date   CESAREAN SECTION  2012   EYE SURGERY     LAPAROSCOPIC TOTAL HYSTERECTOMY  05/2017   Social History   Social History Narrative   Not on file   Immunization History  Administered Date(s) Administered   Influenza Inj Mdck Quad Pf 01/08/2019   Influenza, Quadrivalent, Recombinant, Inj, Pf 12/21/2016, 03/15/2018   Influenza,inj,Quad PF,6+ Mos 03/17/2015, 02/02/2016   PFIZER(Purple Top)SARS-COV-2 Vaccination 05/30/2019, 06/27/2019, 01/09/2020   Tdap 07/27/2021     Objective: Vital Signs: BP (!) 144/88 (BP Location: Left Arm, Patient Position: Sitting, Cuff Size: Small)   Pulse 85   Resp 12   Ht $R'5\' 4"'re$  (1.626 m)   Wt 160 lb 4.8 oz (72.7 kg)   LMP 01/21/2015   BMI  27.52 kg/m    Physical Exam Vitals and nursing note reviewed.  Constitutional:      Appearance: She is well-developed.  HENT:     Head: Normocephalic and atraumatic.     Mouth/Throat:     Comments: No parotid swelling or tenderness noted. Eyes:     Conjunctiva/sclera: Conjunctivae normal.  Cardiovascular:     Rate and Rhythm: Normal rate and regular rhythm.     Heart sounds: Normal heart sounds.  Pulmonary:     Effort: Pulmonary effort is normal.     Breath sounds: Normal breath sounds.  Abdominal:     General: Bowel sounds are normal.     Palpations: Abdomen is soft.  Musculoskeletal:     Cervical back: Normal range of motion.  Lymphadenopathy:     Cervical: No cervical adenopathy.  Skin:    General: Skin is warm and dry.     Capillary Refill: Capillary refill takes less than 2 seconds.  Neurological:     Mental Status: She is alert and oriented to person, place, and time.  Psychiatric:        Behavior: Behavior normal.  Musculoskeletal Exam: C-spine, thoracic spine, lumbar spine have good range of motion.  Trapezius muscle tension and tenderness bilaterally.  Shoulder joints, elbow joints, wrist joints, MCPs, PIPs and DIPs have good range of motion with no synovitis.  PIP and DIP thickening consistent with osteoarthritis of both hands.  Hip joints have good range of motion with no groin pain.  Knee joints have good range of motion with no warmth or effusion.  Ankle joints have good range of motion with no tenderness or joint swelling.  CDAI Exam: CDAI Score: -- Patient Global: --; Provider Global: -- Swollen: --; Tender: -- Joint Exam 10/18/2021   No joint exam has been documented for this visit   There is currently no information documented on the homunculus. Go to the Rheumatology activity and complete the homunculus joint exam.  Investigation: No additional findings.  Imaging: No results found.  Recent Labs: Lab Results  Component Value Date   WBC 4.7  07/27/2021   HGB 14.1 07/27/2021   PLT 172 07/27/2021   NA 140 07/27/2021   K 4.1 07/27/2021   CL 103 07/27/2021   CO2 30 07/27/2021   GLUCOSE 86 07/27/2021   BUN 8 07/27/2021   CREATININE 0.90 07/27/2021   BILITOT 0.6 07/27/2021   ALKPHOS 87 02/06/2018   AST 23 07/27/2021   ALT 22 07/27/2021   PROT 7.5 07/27/2021   ALBUMIN 4.4 02/06/2018   CALCIUM 9.2 07/27/2021   GFRAA 86 07/06/2020    Speciality Comments: PLQ Eye Exam 03/03/2021, Normal @ [facility] f/u 6 months   Procedures:  No procedures performed Allergies: Shrimp [shellfish allergy], Celexa [citalopram hydrobromide], Elemental sulfur, Macrobid [nitrofurantoin], Sulfites, and Synthroid [levothyroxine sodium]  DEXA updated on 08/03/21: The BMD measured at Femur Neck Right is 0.767 g/cm2 with a T-score of -2.0. No falls or fractures.      Assessment / Plan:     Visit Diagnoses: Sjogren's syndrome with keratoconjunctivitis sicca (HCC) - Sicca symptoms with positive ANA and positive Ro:  She continues to have chronic sicca symptoms which have been tolerable overall.  She has been drinking small sips of water throughout the day and occasionally uses lotions for symptomatic relief.  She had no parotid swelling or tenderness on examination today.  No cervical lymphadenopathy.  She remains on Plaquenil 200 mg 1 tablet by mouth twice daily Monday through Friday.  She is tolerating Plaquenil without any side effects and has not missed any doses recently. Lab work from 05/18/21: SPEP did not reveal any abnormal protein bands, Ro antibody positive, dsDNA negative, ESR WNL, and complements WNL.  Discussed the 4-14 fold increased risk for developing lymphoma in patients with Sjogren's syndrome.  Future order for SPEP, ESR, complements, and CBC with differential was placed today.  No cervical lymphadenopathy was palpable on examination today. I also discussed the increased risk for developing ILD in patients with  Sjogren's.  Her lungs were clear to auscultation on examination today.  She was advised to notify us if she develops any new or worsening pulmonary symptoms. She has no signs of inflammatory arthritis at this time.  No synovitis was noted on examination today. She plans on returning to have updated lab work in September and every 5 months to monitor for drug toxicity.  The following future orders were placed today.  She was advised to notify us if she develops any new or worsening symptoms.  She will follow-up in the office in 5 months or sooner if needed. - Plan: CBC  with Differential/Platelet, COMPLETE METABOLIC PANEL WITH GFR, Anti-DNA antibody, double-stranded, C3 and C4, Sedimentation rate, Urinalysis, Routine w reflex microscopic, ANA, Serum protein electrophoresis with reflex, Rheumatoid factor  High risk medication use - Plaquenil 200 mg 1 tablet by mouth twice daily Monday through Friday.  PLQ Eye Exam 03/03/2021, Normal @ [facility].  CBC and CMP updated on 07/27/21. Order for CBC and CMP released today.  - Plan: CBC with Differential/Platelet, COMPLETE METABOLIC PANEL WITH GFR  Primary osteoarthritis of both hands: She has PIP and DIP thickening consistent with osteoarthritis of both hands.  No inflammation noted on examination today.  She experiences occasional discomfort in both hands which is typically exacerbated by overuse activities.  Discussed the importance of joint protection and muscle strengthening.  Primary osteoarthritis of both feet - Evaluated by Dr. Jacqualyn Posey.  She has calluses under her feet.  She has good range of motion of both ankle joints on examination today with no tenderness or inflammation.  Trapezius muscle spasm: She presents today with trapezius muscle tension and tenderness bilaterally.  She experiences muscle spasms with certain positional changes.  Different treatment options were discussed today including massage, applying heat, as  well as topical agents including Salonpas patches or Biofreeze.  If her symptoms persist or worsen she may benefit from trigger point injections in the future.  I also discussed the importance of proper posture and performing neck stretching exercises.  Other medical conditions are listed as follows:  Seborrheic dermatitis  Vitamin D insufficiency  Other fatigue  Essential hypertension  Migraine without aura and without status migrainosus, not intractable  Increased pressure in the eye, left  RLS (restless legs syndrome)  GAD (generalized anxiety disorder)  Allergic urticaria  History of hypothyroidism  Vitamin B12 deficiency  Family history of psoriatic arthritis  Family history of morphea  Orders: Orders Placed This Encounter  Procedures   CBC with Differential/Platelet   COMPLETE METABOLIC PANEL WITH GFR   Anti-DNA antibody, double-stranded   C3 and C4   Sedimentation rate   Urinalysis, Routine w reflex microscopic   ANA   Serum protein electrophoresis with reflex   Rheumatoid factor   No orders of the defined types were placed in this encounter.     Follow-Up Instructions: Return in about 5 months (around 03/20/2022) for Sjogren's syndrome, Osteoarthritis.   Ofilia Neas, PA-C  Note - This record has been created using Dragon software.  Chart creation errors have been sought, but may not always  have been located. Such creation errors do not reflect on  the standard of medical care.

## 2021-10-18 ENCOUNTER — Ambulatory Visit: Payer: BC Managed Care – PPO | Admitting: Physician Assistant

## 2021-10-18 ENCOUNTER — Encounter: Payer: Self-pay | Admitting: Physician Assistant

## 2021-10-18 VITALS — BP 144/88 | HR 85 | Resp 12 | Ht 64.0 in | Wt 160.3 lb

## 2021-10-18 DIAGNOSIS — M3501 Sicca syndrome with keratoconjunctivitis: Secondary | ICD-10-CM

## 2021-10-18 DIAGNOSIS — G2581 Restless legs syndrome: Secondary | ICD-10-CM

## 2021-10-18 DIAGNOSIS — I10 Essential (primary) hypertension: Secondary | ICD-10-CM

## 2021-10-18 DIAGNOSIS — M19071 Primary osteoarthritis, right ankle and foot: Secondary | ICD-10-CM | POA: Diagnosis not present

## 2021-10-18 DIAGNOSIS — E559 Vitamin D deficiency, unspecified: Secondary | ICD-10-CM

## 2021-10-18 DIAGNOSIS — M19042 Primary osteoarthritis, left hand: Secondary | ICD-10-CM

## 2021-10-18 DIAGNOSIS — L500 Allergic urticaria: Secondary | ICD-10-CM

## 2021-10-18 DIAGNOSIS — M19072 Primary osteoarthritis, left ankle and foot: Secondary | ICD-10-CM

## 2021-10-18 DIAGNOSIS — G43009 Migraine without aura, not intractable, without status migrainosus: Secondary | ICD-10-CM

## 2021-10-18 DIAGNOSIS — E538 Deficiency of other specified B group vitamins: Secondary | ICD-10-CM

## 2021-10-18 DIAGNOSIS — Z79899 Other long term (current) drug therapy: Secondary | ICD-10-CM

## 2021-10-18 DIAGNOSIS — M62838 Other muscle spasm: Secondary | ICD-10-CM

## 2021-10-18 DIAGNOSIS — M19041 Primary osteoarthritis, right hand: Secondary | ICD-10-CM

## 2021-10-18 DIAGNOSIS — H40052 Ocular hypertension, left eye: Secondary | ICD-10-CM

## 2021-10-18 DIAGNOSIS — R5383 Other fatigue: Secondary | ICD-10-CM

## 2021-10-18 DIAGNOSIS — Z8639 Personal history of other endocrine, nutritional and metabolic disease: Secondary | ICD-10-CM

## 2021-10-18 DIAGNOSIS — Z840 Family history of diseases of the skin and subcutaneous tissue: Secondary | ICD-10-CM

## 2021-10-18 DIAGNOSIS — F411 Generalized anxiety disorder: Secondary | ICD-10-CM

## 2021-10-18 DIAGNOSIS — L219 Seborrheic dermatitis, unspecified: Secondary | ICD-10-CM

## 2021-11-07 ENCOUNTER — Other Ambulatory Visit: Payer: Self-pay | Admitting: Physician Assistant

## 2021-11-07 NOTE — Telephone Encounter (Signed)
Next Visit: 04/05/2022  Last Visit: 10/18/2021  Labs: 07/27/2021 MCV 103.8, MCH 36.2  Eye exam: 03/03/2021, Normal    Current Dose per office note 10/18/2021: Plaquenil 200 mg 1 tablet by mouth twice daily Monday through Friday.   PU:[id number] syndrome with keratoconjunctivitis sicca   Last Fill: 08/02/2021  Okay to refill Plaquenil?

## 2021-11-16 LAB — HM MAMMOGRAPHY

## 2022-02-04 ENCOUNTER — Other Ambulatory Visit: Payer: Self-pay | Admitting: Rheumatology

## 2022-02-06 ENCOUNTER — Encounter: Payer: Self-pay | Admitting: Physician Assistant

## 2022-02-06 NOTE — Telephone Encounter (Signed)
Next Visit: 04/05/2022   Last Visit: 10/18/2021   Labs: 07/27/2021 MCV 103.8, MCH 36.2   Eye exam: 03/03/2021, Normal     Current Dose per office note 10/18/2021: Plaquenil 200 mg 1 tablet by mouth twice daily Monday through Friday.    ER:[id number] syndrome with keratoconjunctivitis sicca    Last Fill: 11/07/2021  Left message to advise patient she is due to update labs    Okay to refill Plaquenil?

## 2022-02-07 ENCOUNTER — Other Ambulatory Visit: Payer: Self-pay | Admitting: *Deleted

## 2022-02-07 DIAGNOSIS — Z79899 Other long term (current) drug therapy: Secondary | ICD-10-CM

## 2022-02-07 DIAGNOSIS — M3501 Sicca syndrome with keratoconjunctivitis: Secondary | ICD-10-CM

## 2022-02-08 NOTE — Progress Notes (Signed)
CBC stable.  CMP WNL.  ESR WNL UA normal.  RF negative.   Complements WNL

## 2022-02-08 NOTE — Progress Notes (Signed)
dsDNA is negative

## 2022-02-11 LAB — ANA: Anti Nuclear Antibody (ANA): POSITIVE — AB

## 2022-02-11 LAB — COMPLETE METABOLIC PANEL WITH GFR
AG Ratio: 1.4 (calc) (ref 1.0–2.5)
ALT: 22 U/L (ref 6–29)
AST: 25 U/L (ref 10–35)
Albumin: 4.2 g/dL (ref 3.6–5.1)
Alkaline phosphatase (APISO): 81 U/L (ref 37–153)
BUN: 10 mg/dL (ref 7–25)
CO2: 29 mmol/L (ref 20–32)
Calcium: 9.1 mg/dL (ref 8.6–10.4)
Chloride: 100 mmol/L (ref 98–110)
Creat: 0.92 mg/dL (ref 0.50–1.03)
Globulin: 2.9 g/dL (calc) (ref 1.9–3.7)
Glucose, Bld: 85 mg/dL (ref 65–99)
Potassium: 4.1 mmol/L (ref 3.5–5.3)
Sodium: 139 mmol/L (ref 135–146)
Total Bilirubin: 0.4 mg/dL (ref 0.2–1.2)
Total Protein: 7.1 g/dL (ref 6.1–8.1)
eGFR: 74 mL/min/{1.73_m2} (ref >=60)

## 2022-02-11 LAB — PROTEIN ELECTROPHORESIS, SERUM, WITH REFLEX
Albumin ELP: 4 g/dL (ref 3.8–4.8)
Alpha 1: 0.2 g/dL (ref 0.2–0.3)
Alpha 2: 0.6 g/dL (ref 0.5–0.9)
Beta 2: 0.5 g/dL (ref 0.2–0.5)
Beta Globulin: 0.4 g/dL (ref 0.4–0.6)
Gamma Globulin: 1.1 g/dL (ref 0.8–1.7)
Total Protein: 6.9 g/dL (ref 6.1–8.1)

## 2022-02-11 LAB — CBC WITH DIFFERENTIAL/PLATELET
Absolute Monocytes: 431 cells/uL (ref 200–950)
Basophils Absolute: 48 cells/uL (ref 0–200)
Basophils Relative: 1.1 %
Eosinophils Absolute: 189 cells/uL (ref 15–500)
Eosinophils Relative: 4.3 %
HCT: 40.6 % (ref 35.0–45.0)
Hemoglobin: 14.1 g/dL (ref 11.7–15.5)
Lymphs Abs: 1522 cells/uL (ref 850–3900)
MCH: 36.2 pg — ABNORMAL HIGH (ref 27.0–33.0)
MCHC: 34.7 g/dL (ref 32.0–36.0)
MCV: 104.1 fL — ABNORMAL HIGH (ref 80.0–100.0)
MPV: 10.6 fL (ref 7.5–12.5)
Monocytes Relative: 9.8 %
Neutro Abs: 2209 cells/uL (ref 1500–7800)
Neutrophils Relative %: 50.2 %
Platelets: 159 10*3/uL (ref 140–400)
RBC: 3.9 10*6/uL (ref 3.80–5.10)
RDW: 11.8 % (ref 11.0–15.0)
Total Lymphocyte: 34.6 %
WBC: 4.4 10*3/uL (ref 3.8–10.8)

## 2022-02-11 LAB — ANTI-NUCLEAR AB-TITER (ANA TITER)
ANA TITER: 1:80 {titer} — ABNORMAL HIGH
ANA Titer 1: 1:40 {titer} — ABNORMAL HIGH

## 2022-02-11 LAB — URINALYSIS, ROUTINE W REFLEX MICROSCOPIC
Bilirubin Urine: NEGATIVE
Glucose, UA: NEGATIVE
Hgb urine dipstick: NEGATIVE
Ketones, ur: NEGATIVE
Leukocytes,Ua: NEGATIVE
Nitrite: NEGATIVE
Protein, ur: NEGATIVE
Specific Gravity, Urine: 1.005 (ref 1.001–1.035)
pH: 6 (ref 5.0–8.0)

## 2022-02-11 LAB — C3 AND C4
C3 Complement: 130 mg/dL (ref 83–193)
C4 Complement: 30 mg/dL (ref 15–57)

## 2022-02-11 LAB — RHEUMATOID FACTOR: Rheumatoid fact SerPl-aCnc: <14 IU/mL (ref <14)

## 2022-02-11 LAB — ANTI-DNA ANTIBODY, DOUBLE-STRANDED: ds DNA Ab: <1 IU/mL

## 2022-02-11 LAB — SEDIMENTATION RATE: Sed Rate: 25 mm/h (ref 0–30)

## 2022-02-13 NOTE — Progress Notes (Signed)
ANA is low titer positive, not significant.

## 2022-02-21 ENCOUNTER — Encounter: Payer: Self-pay | Admitting: Physician Assistant

## 2022-02-21 ENCOUNTER — Telehealth (INDEPENDENT_AMBULATORY_CARE_PROVIDER_SITE_OTHER): Payer: BC Managed Care – PPO | Admitting: Physician Assistant

## 2022-02-21 VITALS — Temp 98.3°F

## 2022-02-21 DIAGNOSIS — J0140 Acute pansinusitis, unspecified: Secondary | ICD-10-CM | POA: Diagnosis not present

## 2022-02-21 DIAGNOSIS — U071 COVID-19: Secondary | ICD-10-CM | POA: Diagnosis not present

## 2022-02-21 MED ORDER — AZITHROMYCIN 250 MG PO TABS: ORAL_TABLET | ORAL | 0 refills | Status: DC

## 2022-02-21 MED ORDER — DEXAMETHASONE 4 MG PO TABS: 4.0000 mg | ORAL_TABLET | Freq: Two times a day (BID) | ORAL | 0 refills | Status: DC

## 2022-02-21 MED ORDER — NIRMATRELVIR/RITONAVIR (PAXLOVID)TABLET: 3.0000 | ORAL_TABLET | Freq: Two times a day (BID) | ORAL | 0 refills | Status: AC

## 2022-02-21 MED ORDER — AMOXICILLIN-POT CLAVULANATE 875-125 MG PO TABS: 1.0000 | ORAL_TABLET | Freq: Two times a day (BID) | ORAL | 0 refills | Status: DC

## 2022-02-21 NOTE — Progress Notes (Signed)
..Virtual Visit via Video Note  I connected with Heidi Carney on 02/21/22 at  1:00 PM EST by a video enabled telemedicine application and verified that I am speaking with the correct person using two identifiers.  Location: Patient: home Provider: clinic  .Marland KitchenParticipating in visit:  Patient: Heidi Carney Provider: Iran Planas PA-C  I discussed the limitations of evaluation and management by telemedicine and the availability of in person appointments. The patient expressed understanding and agreed to proceed.  History of Present Illness: Pt is a 56 yo female with positive home test for covid. Symptoms started Sunday. Her husband had covid before her. She is coughing some but no SOB. She has lots of sinus pressure, nasal congestion, headache. No body aches, chills, fever. She is not taking any medication right now. She had covid vaccine and one booster. This is the 2nd time she has had covid. No lower extremity edema.  .. Active Ambulatory Problems    Diagnosis Date Noted   GAD (generalized anxiety disorder) 01/26/2015   Elevated blood pressure reading 01/26/2015   Thyroid activity decreased 01/26/2015   Seborrheic dermatitis 03/10/2015   Overweight 03/17/2015   Perimenopausal symptoms 09/13/2015   Hair loss 11/16/2015   RLS (restless legs syndrome) 11/16/2015   B12 deficiency 11/16/2015   Vitamin D insufficiency 11/16/2015   Migraine headache without aura 07/10/2016   Essential hypertension 01/09/2017   Left lower quadrant pain 01/24/2017   Slow transit constipation 01/24/2017   Right ovarian cyst 01/24/2017   Change in stool 02/12/2017   Nausea 07/07/2017   Intractable cyclical vomiting with nausea 07/07/2017   Diarrhea 07/07/2017   Abdominal cramping 07/07/2017   Allergic reaction 07/07/2017   Multiple food allergies 07/12/2017   Bouchard's nodes 12/17/2017   Joint stiffness 12/17/2017   Primary osteoarthritis of both hands 12/17/2017   Surgical 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  replacement therapy 12/17/2017   Colon polyp 12/18/2017   Grade I internal hemorrhoids 12/18/2017   Increased pressure in the eye, left 01/02/2018   Allergic urticaria 02/06/2018   Angioedema 02/06/2018   Food intolerance 02/06/2018   Chronic rhinitis 02/06/2018   Family history of psoriatic arthritis 04/08/2018   Family history of morphea 04/08/2018   Sjogren's syndrome with keratoconjunctivitis sicca ([place]) 04/30/2018   Haglund's deformity of left heel 02/06/2020   Stress 02/06/2020   Itching 02/06/2020   Adverse reaction to food, subsequent encounter 03/30/2020   Sulfite allergy 05/21/2020   Ovarian mass 05/31/2017   Myopia with presbyopia of both eyes 05/17/2018   Grade II hemorrhoids 04/05/2017   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   Osteopenia 08/03/2021   Resolved Ambulatory Problems    Diagnosis Date Noted   Atypical chest pain 01/26/2015   Benign paroxysmal positional vertigo 01/09/2017   Past Medical History:  Diagnosis Date   Anxiety    Arthritis    H/O total vaginal hysterectomy 05/2017   Hypothyroidism    Sjogren's disease ([place])       Observations/Objective: No acute distress Normal mood and appearance Normal breathing  Assessment and Plan: Marland KitchenMarland KitchenDiagnoses and all orders for this visit:  COVID-19 virus infection -     nirmatrelvir/ritonavir EUA (PAXLOVID) 20 x 150 MG & 10 x '100MG'$  TABS; Take 3 tablets by mouth 2 (two) times daily for 5 days. (Take nirmatrelvir 150 mg two tablets twice daily for 5 days and ritonavir 100 mg one tablet twice daily for 5 days) Patient GFR is 74. -     dexamethasone (DECADRON) 4 MG tablet; Take 1 tablet (  4 mg total) by mouth 2 (two) times daily with a meal.  Acute non-recurrent pansinusitis -     azithromycin (ZITHROMAX Z-PAK) 250 MG tablet; Take 2 tablets (500 mg) on  Day 1,  followed by 1 tablet (250 mg) once daily on Days 2 through 5. -     dexamethasone (DECADRON) 4 MG tablet; Take 1 tablet (4 mg total) by mouth 2 (two) times  daily with a meal.   Covid Day 3 Start paxlovid, GFR 74.  Hold zpak and dexamethasone for if symptoms persist or worsen(thanksgiving weekend so went ahead and sent due to office being closed) Discussed symptomatic care Quarantine for 5 days then wear a mask in public for another 5 days Discussed red flag symptoms and when to RTC/UC/ED.  Follow up as needed.    Follow Up Instructions:    I discussed the assessment and treatment plan with the patient. The patient was provided an opportunity to ask questions and all were answered. The patient agreed with the plan and demonstrated an understanding of the instructions.   The patient was advised to call back or seek an in-person evaluation if the symptoms worsen or if the condition fails to improve as anticipated.    Iran Planas, PA-C

## 2022-02-23 ENCOUNTER — Other Ambulatory Visit: Payer: Self-pay | Admitting: Physician Assistant

## 2022-02-23 DIAGNOSIS — F411 Generalized anxiety disorder: Secondary | ICD-10-CM

## 2022-02-23 DIAGNOSIS — F439 Reaction to severe stress, unspecified: Secondary | ICD-10-CM

## 2022-03-08 HISTORY — PX: MOHS SURGERY: SUR867

## 2022-03-21 ENCOUNTER — Other Ambulatory Visit: Payer: Self-pay | Admitting: Physician Assistant

## 2022-03-22 ENCOUNTER — Encounter: Payer: Self-pay | Admitting: *Deleted

## 2022-03-22 NOTE — Telephone Encounter (Signed)
Next Visit: 04/05/2022   Last Visit: 10/18/2021   Labs: 02/07/2022 CBC stable. CMP WNL.   Eye exam: 03/03/2021, Normal     Current Dose per office note 10/18/2021: Plaquenil 200 mg 1 tablet by mouth twice daily Monday through Friday.    PE:[id number]'C syndrome with keratoconjunctivitis sicca    Last Fill: 02/06/2022 (30 day supply)   Okay to refill Plaquenil?

## 2022-03-22 NOTE — Progress Notes (Signed)
Office Visit Note  Patient: Heidi Carney             Date of Birth: 1965/11/13           MRN: [id number]             PCP: Lavada Mesi Referring: Donella Stade, PA-C Visit Date: 04/05/2022 Occupation: '@GUAROCC'$ @  Subjective:  Medication management  History of Present Illness: Heidi Carney is a 56 y.o. female with history of Sjogren's, and osteoarthritis.  She continues to have dry mouth and dry eye symptoms.  She has been using over-the-counter products.  She states recently she has been experiencing some stiffness in her hands and her knee joints with the weather change.  She has been taking hydroxychloroquine 200 mg p.o. twice daily Monday to Friday without any interruption.  She has an eye examination is scheduled in March.  She denies any history of shortness of breath.  She had sinusitis couple of months ago which resolved after taking antibiotics.    Activities of Daily Living:  Patient reports morning stiffness for a few minutes.   Patient Reports nocturnal pain.  Difficulty dressing/grooming: Denies Difficulty climbing stairs: Denies Difficulty getting out of chair: Denies Difficulty using hands for taps, buttons, cutlery, and/or writing: Denies  Review of Systems  Constitutional:  Positive for fatigue.  HENT:  Positive for mouth dryness. Negative for mouth sores.   Eyes:  Positive for dryness.  Respiratory:  Negative for shortness of breath.   Cardiovascular:  Negative for chest pain and palpitations.  Gastrointestinal:  Positive for constipation. Negative for blood in stool and diarrhea.  Endocrine: Negative for increased urination.  Genitourinary:  Negative for involuntary urination.  Musculoskeletal:  Positive for myalgias, morning stiffness, muscle tenderness and myalgias. Negative for joint pain, gait problem, joint pain, joint swelling and muscle weakness.  Skin:  Positive for sensitivity to sunlight. Negative for color change, rash and hair loss.   Allergic/Immunologic: Negative for susceptible to infections.  Neurological:  Negative for dizziness and headaches.  Hematological:  Negative for swollen glands.  Psychiatric/Behavioral:  Negative for depressed mood and sleep disturbance. The patient is nervous/anxious.     PMFS History:  Patient Active Problem List   Diagnosis Date Noted   Osteopenia 08/03/2021   Sulfite allergy 05/21/2020   Adverse reaction to food, subsequent encounter 03/30/2020   Haglund's deformity of left heel 02/06/2020   Stress 02/06/2020   Itching 02/06/2020   Myopia with presbyopia of both eyes 05/17/2018   Sjogren's syndrome with keratoconjunctivitis sicca ([place]) 04/30/2018   Family history of psoriatic arthritis 04/08/2018   Family history of morphea 04/08/2018   Allergic urticaria 02/06/2018   Angioedema 02/06/2018   Food intolerance 02/06/2018   Chronic rhinitis 02/06/2018   Increased pressure in the eye, left 01/02/2018   Colon polyp 12/18/2017   Grade I internal hemorrhoids 12/18/2017   Bouchard's nodes 12/17/2017   Joint stiffness 12/17/2017   Primary osteoarthritis of both hands 12/17/2017   Surgical 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 replacement therapy 12/17/2017   Multiple food allergies 07/12/2017   Nausea 07/07/2017   Intractable cyclical vomiting with nausea 07/07/2017   Diarrhea 07/07/2017   Abdominal cramping 07/07/2017   Allergic reaction 07/07/2017   Ovarian mass 05/31/2017   Grade II hemorrhoids 04/05/2017   Change in stool 02/12/2017   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   Left lower quadrant pain 01/24/2017   Slow transit constipation 01/24/2017   Right ovarian cyst 01/24/2017   Essential hypertension 01/09/2017  Migraine headache without aura 07/10/2016   Hair loss 11/16/2015   RLS (restless legs syndrome) 11/16/2015   B12 deficiency 11/16/2015   Vitamin D insufficiency 11/16/2015   Perimenopausal symptoms 09/13/2015   Overweight 03/17/2015   Seborrheic dermatitis 03/10/2015    GAD (generalized anxiety disorder) 01/26/2015   Elevated blood pressure reading 01/26/2015   Thyroid activity decreased 01/26/2015    Past Medical History:  Diagnosis Date   Anxiety    Arthritis    H/O total vaginal hysterectomy 05/2017   Hypothyroidism    Sjogren's disease ([place])     Family History  Problem Relation Age of Onset   Diabetes Father    Cancer Father        prostate CA   Stroke Father    Glaucoma Mother    Asthma Sister    Psoriasis Sister        psoriatic arthritis   Arthritis Sister    Scleroderma Sister    Eczema Sister    Asthma Sister    Healthy Daughter    Angioedema Neg Hx    Immunodeficiency Neg Hx    Urticaria Neg Hx    Past Surgical History:  Procedure Laterality Date   CESAREAN SECTION  2012   EYE SURGERY     LAPAROSCOPIC TOTAL HYSTERECTOMY  05/2017   MOHS SURGERY  03/08/2022   forehead   Social History   Social History Narrative   Not on file   Immunization History  Administered Date(s) Administered   Influenza Inj Mdck Quad Pf 01/08/2019   Influenza, Quadrivalent, Recombinant, Inj, Pf 12/21/2016, 03/15/2018   Influenza,inj,Quad PF,6+ Mos 03/17/2015, 02/02/2016   PFIZER(Purple Top)SARS-COV-2 Vaccination 05/30/2019, 06/27/2019, 01/09/2020   Tdap 07/27/2021     Objective: Vital Signs: BP 117/71 (BP Location: Left Arm, Patient Position: Sitting, Cuff Size: Normal)   Pulse 90   Resp 17   Ht '5\' 4"'$  (1.626 m)   Wt 163 lb 6.4 oz (74.1 kg)   LMP 01/21/2015   BMI 28.05 kg/m    Physical Exam Vitals and nursing note reviewed.  Constitutional:      Appearance: She is well-developed.  HENT:     Head: Normocephalic and atraumatic.  Eyes:     Conjunctiva/sclera: Conjunctivae normal.  Cardiovascular:     Rate and Rhythm: Normal rate and regular rhythm.     Heart sounds: Normal heart sounds.  Pulmonary:     Effort: Pulmonary effort is normal.     Breath sounds: Normal breath sounds.  Abdominal:     General: Bowel sounds are normal.      Palpations: Abdomen is soft.  Musculoskeletal:     Cervical back: Normal range of motion.  Lymphadenopathy:     Cervical: No cervical adenopathy.  Skin:    General: Skin is warm and dry.     Capillary Refill: Capillary refill takes less than 2 seconds.  Neurological:     Mental Status: She is alert and oriented to person, place, and time.  Psychiatric:        Behavior: Behavior normal.      Musculoskeletal Exam: Cervical spine was in good range of motion.  She had good mobility in her lumbar spine.  Shoulders, elbows, wrist joints, MCPs were in good range of motion.  She had bilateral PIP and DIP thickening with no synovitis.  Flexor tendon thickening without triggering was noted.  Hip joints and knee joints with good range of motion without any warmth swelling or effusion.  There was no tenderness  over ankles or MTPs.  CDAI Exam: CDAI Score: -- Patient Global: --; Provider Global: -- Swollen: --; Tender: -- Joint Exam 04/05/2022   No joint exam has been documented for this visit   There is currently no information documented on the homunculus. Go to the Rheumatology activity and complete the homunculus joint exam.  Investigation: No additional findings.  Imaging: No results found.  Recent Labs: Lab Results  Component Value Date   WBC 4.4 02/07/2022   HGB 14.1 02/07/2022   PLT 159 02/07/2022   NA 139 02/07/2022   K 4.1 02/07/2022   CL 100 02/07/2022   CO2 29 02/07/2022   GLUCOSE 85 02/07/2022   BUN 10 02/07/2022   CREATININE 0.92 02/07/2022   BILITOT 0.4 02/07/2022   ALKPHOS 87 02/06/2018   AST 25 02/07/2022   ALT 22 02/07/2022   PROT 6.9 02/07/2022   PROT 7.1 02/07/2022   ALBUMIN 4.4 02/06/2018   CALCIUM 9.1 02/07/2022   GFRAA 86 07/06/2020    Speciality Comments: PLQ Eye Exam 03/03/2021, Normal @ [facility] f/u 6 months PLQ eye exam scheduled 06/2022 per patient.   Procedures:  No procedures performed Allergies: Shrimp  [shellfish allergy], Celexa [citalopram hydrobromide], Elemental sulfur, Macrobid [nitrofurantoin], Sulfites, Synthroid [levothyroxine sodium], and Z-pak [azithromycin]   Assessment / Plan:     Visit Diagnoses: Sjogren's syndrome with keratoconjunctivitis sicca ([facility]) - Sicca symptoms with positive ANA and positive Ro: -She continues to have dry mouth and dry eye symptoms.  She has been using over-the-counter products.  She has history of increased intraocular pressure and cannot use pilocarpine.  She continues to take hydroxychloroquine 200 mg p.o. twice daily Monday to Friday without interruption.  Over-the-counter products and salivary gland massage was discussed.  She denies any history of parotid swelling, lymphadenopathy or shortness of breath.  Increased risk of ILD and lymphoma was discussed.  Labs obtained in November of thousand 23 were reviewed which were within normal limits.  She will have repeat labs in April.  Plan: Urinalysis, Routine w reflex microscopic, Anti-DNA antibody, double-stranded, C3 and C4, Sedimentation rate, Sjogrens syndrome-A extractable nuclear antibody  High risk medication use - Plaquenil 200 mg 1 tablet by mouth twice daily Monday through Friday. PLQ Eye Exam 03/03/2021.  Patient is scheduled to have repeat eye examination in March 2024.  Information on immunization was placed in the AVS.- Plan: CBC with Differential/Platelet, COMPLETE METABOLIC PANEL WITH GFR  Primary osteoarthritis of both hands-she has bilateral PIP and DIP thickening.  No synovitis was noted.  Joint protection muscle strengthening was discussed.  Primary osteoarthritis of both feet -she continues to have some stiffness and discomfort in her feet.  She has calluses.  Evaluated by Dr. Jacqualyn Posey.  Trapezius muscle spasm-stretching exercises were discussed.  Seborrheic dermatitis-she currently does not have any rash.  Other fatigue  Osteopenia of multiple sites- DEXA scan from Aug 03, 2021 was reviewed  which showed T-score of -2.0, BMD 0.767 in the right femoral neck consistent with osteopenia.  Use of calcium rich diet and vitamin D supplement was discussed.  Vitamin D insufficiency-she takes vitamin D supplement.  Essential hypertension-blood pressure was elevated at 143/80 today.  Repeat blood pressure was 117/71.  She was advised to monitor blood pressure closely.  Increased pressure in the eye, left-followed by ophthalmologist.  The medical problems are listed as follows:  Migraine without aura and without status migrainosus, not intractable  GAD (generalized anxiety disorder)  RLS (restless legs syndrome)  Allergic urticaria  Vitamin B12 deficiency  History of hypothyroidism  Family history of morphea  Family history of psoriatic arthritis  Orders: Orders Placed This Encounter  Procedures   CBC with Differential/Platelet   COMPLETE METABOLIC PANEL WITH GFR   Urinalysis, Routine w reflex microscopic   Anti-DNA antibody, double-stranded   C3 and C4   Sedimentation rate   Sjogrens syndrome-A extractable nuclear antibody   No orders of the defined types were placed in this encounter.    Follow-Up Instructions: Return in about 5 months (around 09/04/2022) for Sjogren's.   Bo Merino, MD  Note - This record has been created using Editor, commissioning.  Chart creation errors have been sought, but may not always  have been located. Such creation errors do not reflect on  the standard of medical care.

## 2022-04-05 ENCOUNTER — Encounter: Payer: Self-pay | Admitting: Rheumatology

## 2022-04-05 ENCOUNTER — Ambulatory Visit: Payer: BC Managed Care – PPO | Attending: Rheumatology | Admitting: Rheumatology

## 2022-04-05 VITALS — BP 117/71 | HR 90 | Resp 17 | Ht 64.0 in | Wt 163.4 lb

## 2022-04-05 DIAGNOSIS — M19072 Primary osteoarthritis, left ankle and foot: Secondary | ICD-10-CM

## 2022-04-05 DIAGNOSIS — M19071 Primary osteoarthritis, right ankle and foot: Secondary | ICD-10-CM

## 2022-04-05 DIAGNOSIS — M19041 Primary osteoarthritis, right hand: Secondary | ICD-10-CM

## 2022-04-05 DIAGNOSIS — M3501 Sicca syndrome with keratoconjunctivitis: Secondary | ICD-10-CM | POA: Diagnosis not present

## 2022-04-05 DIAGNOSIS — Z79899 Other long term (current) drug therapy: Secondary | ICD-10-CM | POA: Diagnosis not present

## 2022-04-05 DIAGNOSIS — L500 Allergic urticaria: Secondary | ICD-10-CM

## 2022-04-05 DIAGNOSIS — H40052 Ocular hypertension, left eye: Secondary | ICD-10-CM

## 2022-04-05 DIAGNOSIS — M19042 Primary osteoarthritis, left hand: Secondary | ICD-10-CM

## 2022-04-05 DIAGNOSIS — I10 Essential (primary) hypertension: Secondary | ICD-10-CM

## 2022-04-05 DIAGNOSIS — R5383 Other fatigue: Secondary | ICD-10-CM

## 2022-04-05 DIAGNOSIS — M62838 Other muscle spasm: Secondary | ICD-10-CM

## 2022-04-05 DIAGNOSIS — L219 Seborrheic dermatitis, unspecified: Secondary | ICD-10-CM

## 2022-04-05 DIAGNOSIS — F411 Generalized anxiety disorder: Secondary | ICD-10-CM

## 2022-04-05 DIAGNOSIS — E559 Vitamin D deficiency, unspecified: Secondary | ICD-10-CM

## 2022-04-05 DIAGNOSIS — Z8639 Personal history of other endocrine, nutritional and metabolic disease: Secondary | ICD-10-CM

## 2022-04-05 DIAGNOSIS — G2581 Restless legs syndrome: Secondary | ICD-10-CM

## 2022-04-05 DIAGNOSIS — E538 Deficiency of other specified B group vitamins: Secondary | ICD-10-CM

## 2022-04-05 DIAGNOSIS — M8589 Other specified disorders of bone density and structure, multiple sites: Secondary | ICD-10-CM

## 2022-04-05 DIAGNOSIS — G43009 Migraine without aura, not intractable, without status migrainosus: Secondary | ICD-10-CM

## 2022-04-05 DIAGNOSIS — Z840 Family history of diseases of the skin and subcutaneous tissue: Secondary | ICD-10-CM

## 2022-04-05 NOTE — Patient Instructions (Signed)
Standing Labs We placed an order today for your standing lab work.   Please have your standing labs drawn in April  Please have your labs drawn 2 weeks prior to your appointment so that the provider can discuss your lab results at your appointment.  Please note that you may see your imaging and lab results in [facility] before we have reviewed them. We will contact you once all results are reviewed. Please allow our office up to 72 hours to thoroughly review all of the results before contacting the office for clarification of your results.  Lab hours are:   Monday through Thursday from 8:00 am -12:30 pm and 1:00 pm-5:00 pm and Friday from 8:00 am-12:00 pm.  Please be advised, all patients with office appointments requiring lab work will take precedent over walk-in lab work.   Labs are drawn by [facility]. Please bring your co-pay at the time of your lab draw.  You may receive a bill from [facility] for your lab work.  Please note if you are on Hydroxychloroquine and and an order has been placed for a Hydroxychloroquine level, you will need to have it drawn 4 hours or more after your last dose.  If you wish to have your labs drawn at another location, please call the office 24 hours in advance so we can fax the orders.  The office is located at [address], [place], [place], [place] [address] No appointment is necessary.    If you have any questions regarding directions or hours of operation,  please call [phone].   As a reminder, please drink plenty of water prior to coming for your lab work. Thanks!  Vaccines You are taking a medication(s) that can suppress your immune system.  The following immunizations are recommended: Flu annually Covid-19  Td/Tdap (tetanus, diphtheria, pertussis) every 10 years Pneumonia (Prevnar 15 then Pneumovax 23 at least 1 year apart.  Alternatively, can take Prevnar 20 without needing additional dose) Shingrix: 2 doses from 4 weeks to 6 months  apart  Please check with your PCP to make sure you are up to date.

## 2022-04-20 ENCOUNTER — Other Ambulatory Visit: Payer: Self-pay | Admitting: Rheumatology

## 2022-05-18 ENCOUNTER — Other Ambulatory Visit: Payer: Self-pay | Admitting: Rheumatology

## 2022-05-18 NOTE — Telephone Encounter (Signed)
Next PLQ Eye Exam 06/2022

## 2022-05-18 NOTE — Telephone Encounter (Signed)
Next Visit: 09/06/2022  Last Visit: 04/05/2022  Labs: 02/07/2022 CBC stable. CMP WNL. ESR WNL UA normal. RF negative.   Complements WNL, dsDNA is negative,  ANA is low titer positive, not significant.  Eye exam: 03/03/2021   Current Dose per office note 04/05/2022: Plaquenil 200 mg 1 tablet by mouth twice daily Monday through Friday.   DX: Sjogren's syndrome with keratoconjunctivitis sicca   Last Fill: 04/20/2022  Okay to refill Plaquenil?

## 2022-05-18 NOTE — Telephone Encounter (Signed)
Please clarify when her next plaquenil eye exam is scheduled?

## 2022-06-15 ENCOUNTER — Other Ambulatory Visit: Payer: Self-pay | Admitting: Physician Assistant

## 2022-06-18 ENCOUNTER — Encounter: Payer: Self-pay | Admitting: Rheumatology

## 2022-07-02 ENCOUNTER — Other Ambulatory Visit: Payer: Self-pay | Admitting: Physician Assistant

## 2022-07-03 NOTE — Telephone Encounter (Signed)
Last Fill: 05/18/2022 (30 day supply)  Eye exam: 06/13/2022 Normal    Labs: 02/07/2022  Next Visit: 09/06/2022  Last Visit: 04/05/2022  [id number] syndrome with keratoconjunctivitis sicca   Current Dose per office note 04/05/2022: Plaquenil 200 mg 1 tablet by mouth twice daily Monday through Friday   Okay to refill Plaquenil?

## 2022-07-07 ENCOUNTER — Encounter: Payer: Self-pay | Admitting: Physician Assistant

## 2022-07-17 ENCOUNTER — Other Ambulatory Visit: Payer: Self-pay | Admitting: Physician Assistant

## 2022-07-17 ENCOUNTER — Other Ambulatory Visit: Payer: Self-pay | Admitting: *Deleted

## 2022-07-17 DIAGNOSIS — E039 Hypothyroidism, unspecified: Secondary | ICD-10-CM

## 2022-07-17 DIAGNOSIS — Z79899 Other long term (current) drug therapy: Secondary | ICD-10-CM

## 2022-07-17 DIAGNOSIS — M3501 Sicca syndrome with keratoconjunctivitis: Secondary | ICD-10-CM

## 2022-07-17 LAB — CBC WITH DIFFERENTIAL/PLATELET
Eosinophils Absolute: 200 cells/uL (ref 15–500)
Eosinophils Relative: 3.7 %
Hemoglobin: 13.7 g/dL (ref 11.7–15.5)
MCH: 35.3 pg — ABNORMAL HIGH (ref 27.0–33.0)
MPV: 10.2 fL (ref 7.5–12.5)
Total Lymphocyte: 25.6 %

## 2022-07-18 LAB — CBC WITH DIFFERENTIAL/PLATELET
Absolute Monocytes: 459 cells/uL (ref 200–950)
Basophils Absolute: 32 cells/uL (ref 0–200)
Basophils Relative: 0.6 %
HCT: 40.2 % (ref 35.0–45.0)
Lymphs Abs: 1382 cells/uL (ref 850–3900)
MCHC: 34.1 g/dL (ref 32.0–36.0)
MCV: 103.6 fL — ABNORMAL HIGH (ref 80.0–100.0)
Monocytes Relative: 8.5 %
Neutro Abs: 3326 cells/uL (ref 1500–7800)
Neutrophils Relative %: 61.6 %
Platelets: 157 10*3/uL (ref 140–400)
RBC: 3.88 10*6/uL (ref 3.80–5.10)
RDW: 11.8 % (ref 11.0–15.0)
WBC: 5.4 10*3/uL (ref 3.8–10.8)

## 2022-07-18 LAB — COMPLETE METABOLIC PANEL WITH GFR
AG Ratio: 1.5 (calc) (ref 1.0–2.5)
ALT: 19 U/L (ref 6–29)
AST: 21 U/L (ref 10–35)
Albumin: 4.2 g/dL (ref 3.6–5.1)
Alkaline phosphatase (APISO): 75 U/L (ref 37–153)
BUN: 9 mg/dL (ref 7–25)
CO2: 27 mmol/L (ref 20–32)
Calcium: 8.7 mg/dL (ref 8.6–10.4)
Chloride: 103 mmol/L (ref 98–110)
Creat: 0.91 mg/dL (ref 0.50–1.03)
Globulin: 2.8 g/dL (calc) (ref 1.9–3.7)
Glucose, Bld: 81 mg/dL (ref 65–139)
Potassium: 3.7 mmol/L (ref 3.5–5.3)
Sodium: 137 mmol/L (ref 135–146)
Total Bilirubin: 0.4 mg/dL (ref 0.2–1.2)
Total Protein: 7 g/dL (ref 6.1–8.1)
eGFR: 75 mL/min/{1.73_m2} (ref >=60)

## 2022-07-18 LAB — URINALYSIS, ROUTINE W REFLEX MICROSCOPIC
Bacteria, UA: NONE SEEN /HPF
Bilirubin Urine: NEGATIVE
Glucose, UA: NEGATIVE
Hgb urine dipstick: NEGATIVE
Hyaline Cast: NONE SEEN /LPF
Ketones, ur: NEGATIVE
Nitrite: NEGATIVE
Protein, ur: NEGATIVE
RBC / HPF: NONE SEEN /HPF (ref 0–2)
Specific Gravity, Urine: 1.006 (ref 1.001–1.035)
Squamous Epithelial / HPF: NONE SEEN /HPF (ref <=5)
WBC, UA: NONE SEEN /HPF (ref 0–5)
pH: 6.5 (ref 5.0–8.0)

## 2022-07-18 LAB — SEDIMENTATION RATE: Sed Rate: 19 mm/h (ref 0–30)

## 2022-07-18 LAB — SJOGRENS SYNDROME-A EXTRACTABLE NUCLEAR ANTIBODY: SSA (Ro) (ENA) Antibody, IgG: >8 AI — AB

## 2022-07-18 LAB — C3 AND C4
C3 Complement: 128 mg/dL (ref 83–193)
C4 Complement: 25 mg/dL (ref 15–57)

## 2022-07-18 LAB — MICROSCOPIC MESSAGE

## 2022-07-18 LAB — ANTI-DNA ANTIBODY, DOUBLE-STRANDED: ds DNA Ab: <1 IU/mL

## 2022-07-19 NOTE — Progress Notes (Signed)
Ro antibodies is stable and positive.  UA negative, CBC is stable, CMP normal, sed rate normal, double-stranded DNA negative, complements normal

## 2022-07-24 ENCOUNTER — Encounter: Payer: Self-pay | Admitting: Physician Assistant

## 2022-07-24 DIAGNOSIS — E039 Hypothyroidism, unspecified: Secondary | ICD-10-CM

## 2022-07-24 MED ORDER — LEVOTHYROXINE SODIUM 75 MCG PO CAPS
ORAL_CAPSULE | ORAL | 0 refills | Status: DC
Start: 2022-07-24 — End: 2022-09-01

## 2022-07-24 MED ORDER — LEVOTHYROXINE SODIUM 75 MCG PO CAPS
ORAL_CAPSULE | ORAL | 0 refills | Status: DC
Start: 2022-07-24 — End: 2022-07-24

## 2022-07-31 ENCOUNTER — Ambulatory Visit: Payer: BC Managed Care – PPO | Admitting: Physician Assistant

## 2022-07-31 ENCOUNTER — Encounter: Payer: Self-pay | Admitting: Physician Assistant

## 2022-07-31 ENCOUNTER — Other Ambulatory Visit: Payer: Self-pay | Admitting: Physician Assistant

## 2022-07-31 VITALS — BP 122/78 | HR 86 | Resp 20 | Ht 64.0 in | Wt 163.1 lb

## 2022-07-31 DIAGNOSIS — F439 Reaction to severe stress, unspecified: Secondary | ICD-10-CM

## 2022-07-31 DIAGNOSIS — F411 Generalized anxiety disorder: Secondary | ICD-10-CM

## 2022-07-31 DIAGNOSIS — E039 Hypothyroidism, unspecified: Secondary | ICD-10-CM | POA: Diagnosis not present

## 2022-07-31 DIAGNOSIS — E559 Vitamin D deficiency, unspecified: Secondary | ICD-10-CM

## 2022-07-31 DIAGNOSIS — R252 Cramp and spasm: Secondary | ICD-10-CM | POA: Insufficient documentation

## 2022-07-31 DIAGNOSIS — E538 Deficiency of other specified B group vitamins: Secondary | ICD-10-CM

## 2022-07-31 DIAGNOSIS — Z79899 Other long term (current) drug therapy: Secondary | ICD-10-CM

## 2022-07-31 DIAGNOSIS — M791 Myalgia, unspecified site: Secondary | ICD-10-CM

## 2022-07-31 DIAGNOSIS — N898 Other specified noninflammatory disorders of vagina: Secondary | ICD-10-CM

## 2022-07-31 LAB — CBC WITH DIFFERENTIAL/PLATELET
Absolute Monocytes: 405 cells/uL (ref 200–950)
Hemoglobin: 14.4 g/dL (ref 11.7–15.5)
MCHC: 34.4 g/dL (ref 32.0–36.0)
MCV: 101.7 fL — ABNORMAL HIGH (ref 80.0–100.0)
Neutrophils Relative %: 55.1 %
RDW: 11.8 % (ref 11.0–15.0)
Total Lymphocyte: 29.7 %

## 2022-07-31 MED ORDER — VENLAFAXINE HCL ER 150 MG PO CP24: 150.0000 mg | ORAL_CAPSULE | Freq: Every day | ORAL | 1 refills | Status: DC

## 2022-07-31 MED ORDER — OSPEMIFENE 60 MG PO TABS
60.0000 mg | ORAL_TABLET | Freq: Every day | ORAL | 2 refills | Status: DC
Start: 2022-07-31 — End: 2023-01-30

## 2022-07-31 MED ORDER — TIROSINT 88 MCG PO CAPS
ORAL_CAPSULE | ORAL | 1 refills | Status: DC
Start: 2022-07-31 — End: 2022-12-07

## 2022-07-31 NOTE — Progress Notes (Signed)
Established Patient Office Visit  Subjective   Patient ID: Heidi Carney, female    DOB: 27-Jun-1965  Age: 57 y.o. MRN: [id number]  Chief Complaint  Patient presents with   Follow-up   Medication Refill    HPI Pt is a 57 yo female with Migraines, hypothyroidism, Sjogren's, HTN, B12 and vitamin D deficiencies, GAD who presents to the clinic for follow up on medication.   Pt is taking MVI and b12 shots every 2 months. Her fatigue has been up and down but overall pretty good. Mood controlled. She is having some intermittent thigh muscles aches.   Thyroid medication: MWF and all other days.   Her Sjgren is creating some vaginal dryness that cocnut oil is not helping with right now. She wonders what else to try. She does not want to try hormonal options.    Active Ambulatory Problems    Diagnosis Date Noted   GAD (generalized anxiety disorder) 01/26/2015   Elevated blood pressure reading 01/26/2015   Thyroid activity decreased 01/26/2015   Seborrheic dermatitis 03/10/2015   Overweight 03/17/2015   Perimenopausal symptoms 09/13/2015   Hair loss 11/16/2015   RLS (restless legs syndrome) 11/16/2015   B12 deficiency 11/16/2015   Vitamin D deficiency 11/16/2015   Migraine headache without aura 07/10/2016   Essential hypertension 01/09/2017   Left lower quadrant pain 01/24/2017   Slow transit constipation 01/24/2017   Right ovarian cyst 01/24/2017   Change in stool 02/12/2017   Nausea 07/07/2017   Intractable cyclical vomiting with nausea 07/07/2017   Diarrhea 07/07/2017   Abdominal cramping 07/07/2017   Allergic reaction 07/07/2017   Multiple food allergies 07/12/2017   Bouchard's nodes 12/17/2017   Joint stiffness 12/17/2017   Primary osteoarthritis of both hands 12/17/2017   Surgical 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 replacement therapy 12/17/2017   Colon polyp 12/18/2017   Grade I internal hemorrhoids 12/18/2017   Increased pressure in the eye, left 01/02/2018    Allergic urticaria 02/06/2018   Angioedema 02/06/2018   Food intolerance 02/06/2018   Chronic rhinitis 02/06/2018   Family history of psoriatic arthritis 04/08/2018   Family history of morphea 04/08/2018   Sjogren's syndrome with keratoconjunctivitis sicca (HCC) 04/30/2018   Haglund's deformity of left heel 02/06/2020   Stress 02/06/2020   Itching 02/06/2020   Adverse reaction to food, subsequent encounter 03/30/2020   Sulfite allergy 05/21/2020   Ovarian mass 05/31/2017   Myopia with presbyopia of both eyes 05/17/2018   Grade II hemorrhoids 04/05/2017   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    Objective:     BP 122/78   Pulse 86   Resp 20   Ht 5\' 4"  (1.626 m)   Wt 163 lb 1.9 oz (74 kg)   LMP 01/21/2015   SpO2 100%   BMI 28.00 kg/m  BP Readings from Last 3 Encounters:  07/31/22 122/78  04/05/22 117/71  10/18/21 (!) 144/88   Wt Readings from Last 3 Encounters:  07/31/22 163 lb 1.9 oz (74 kg)  04/05/22 163 lb 6.4 oz (74.1 kg)  10/18/21 160 lb 4.8 oz (72.7 kg)    ..    07/31/2022    9:53 AM 07/27/2021    9:48 AM 05/21/2020    9:01 AM 10/30/2018   10:40 AM 03/15/2018   11:27 AM  Depression screen PHQ 2/9  Decreased Interest 0 0 0 0 1  Down, Depressed, Hopeless 0 0 0 0 2  PHQ - 2 Score 0 0 0 0 3  Altered sleeping  2 0 0 2  Tired, decreased energy  2 1 0 2  Change in appetite  0 0 0 2  Feeling bad or failure about yourself   1 0 1 1  Trouble concentrating  1 0 0 3  Moving slowly or fidgety/restless  0 0 0 0  Suicidal thoughts  0 0 0 0  PHQ-9 Score  6 1 1 13   Difficult  doing work/chores  Somewhat difficult Not difficult at all Not difficult at all Somewhat difficult   ..    07/27/2021    9:48 AM 05/21/2020    9:01 AM 10/30/2018   10:40 AM 03/15/2018   11:26 AM  GAD 7 : Generalized Anxiety Score  Nervous, Anxious, on Edge 1 0 1 3  Control/stop worrying 0 0 0 0  Worry too much - different things 1 0 0 3  Trouble relaxing 0 0 0 2  Restless 0 1 0 2  Easily annoyed or irritable 1 0 1 3  Afraid - awful might happen 0 0 0 0  Total GAD 7 Score 3 1 2 13   Anxiety Difficulty Somewhat difficult Not difficult at all Not difficult at all Somewhat difficult      Physical Exam Constitutional:      Appearance: Normal appearance.  HENT:     Head: Normocephalic.  Cardiovascular:     Rate and Rhythm: Normal rate and regular rhythm.     Pulses: Normal pulses.     Heart sounds: Normal heart sounds.  Pulmonary:     Effort: Pulmonary effort is normal.     Breath sounds: Normal breath sounds.  Musculoskeletal:     Cervical back: No tenderness.     Right lower leg: No edema.     Left lower leg: No edema.  Lymphadenopathy:     Cervical: No cervical adenopathy.  Neurological:     General: No focal deficit present.     Mental Status: She is alert.  Psychiatric:        Mood and Affect: Mood normal.       The 10-year ASCVD risk score (Arnett DK, et al., 2019) is: 1.4%    Assessment & Plan:  Marland KitchenMarland KitchenVernita was seen today for follow-up and medication refill.  Diagnoses and all orders for this visit:  B12 deficiency -     B12 and Folate Panel -     CBC w/Diff/Platelet  Vitamin D deficiency -     VITAMIN D 25 Hydroxy (Vit-D Deficiency, Fractures) -     CBC w/Diff/Platelet  Medication management -     COMPLETE METABOLIC PANEL WITH GFR -     CBC w/Diff/Platelet  Acquired hypothyroidism -     TSH + free T4 -     TIROSINT 88 MCG CAPS; Take one tablet Sunday, Tuesday, Thursday and Saturday. -     CBC w/Diff/Platelet  Myalgia -     Magnesium -     CBC  w/Diff/Platelet  Vaginal dryness -     Ospemifene 60 MG TABS; Take 1 tablet (  60 mg total) by mouth daily.   Will get labs drawn to follow up on medications and supplements.  PHQ/GAD stable Continue effexor 2nd BP looks great TSH ordered will adjust accordingly Trial of osphene for vaginal dryness that is not hormonal Add magnesium at bedtime for myalgias Will call with labs   Tandy Gaw, PA-C

## 2022-08-01 ENCOUNTER — Other Ambulatory Visit: Payer: Self-pay | Admitting: Physician Assistant

## 2022-08-01 DIAGNOSIS — N898 Other specified noninflammatory disorders of vagina: Secondary | ICD-10-CM

## 2022-08-01 LAB — CBC WITH DIFFERENTIAL/PLATELET
Basophils Absolute: 31 cells/uL (ref 0–200)
Basophils Relative: 0.7 %
Eosinophils Absolute: 233 cells/uL (ref 15–500)
Eosinophils Relative: 5.3 %
HCT: 41.8 % (ref 35.0–45.0)
Lymphs Abs: 1307 cells/uL (ref 850–3900)
MCH: 35 pg — ABNORMAL HIGH (ref 27.0–33.0)
MPV: 10.3 fL (ref 7.5–12.5)
Monocytes Relative: 9.2 %
Neutro Abs: 2424 cells/uL (ref 1500–7800)
Platelets: 164 10*3/uL (ref 140–400)
RBC: 4.11 10*6/uL (ref 3.80–5.10)
WBC: 4.4 10*3/uL (ref 3.8–10.8)

## 2022-08-01 LAB — B12 AND FOLATE PANEL
Folate: >24 ng/mL
Vitamin B-12: 648 pg/mL (ref 200–1100)

## 2022-08-01 LAB — COMPLETE METABOLIC PANEL WITH GFR
AG Ratio: 1.5 (calc) (ref 1.0–2.5)
ALT: 20 U/L (ref 6–29)
AST: 22 U/L (ref 10–35)
Albumin: 4.3 g/dL (ref 3.6–5.1)
Alkaline phosphatase (APISO): 83 U/L (ref 37–153)
BUN: 9 mg/dL (ref 7–25)
CO2: 30 mmol/L (ref 20–32)
Calcium: 9.1 mg/dL (ref 8.6–10.4)
Chloride: 103 mmol/L (ref 98–110)
Creat: 0.88 mg/dL (ref 0.50–1.03)
Globulin: 2.9 g/dL (calc) (ref 1.9–3.7)
Glucose, Bld: 89 mg/dL (ref 65–99)
Potassium: 4.3 mmol/L (ref 3.5–5.3)
Sodium: 140 mmol/L (ref 135–146)
Total Bilirubin: 0.5 mg/dL (ref 0.2–1.2)
Total Protein: 7.2 g/dL (ref 6.1–8.1)
eGFR: 78 mL/min/{1.73_m2} (ref >=60)

## 2022-08-01 LAB — VITAMIN D 25 HYDROXY (VIT D DEFICIENCY, FRACTURES): Vit D, 25-Hydroxy: 35 ng/mL (ref 30–100)

## 2022-08-01 LAB — MAGNESIUM: Magnesium: 2.1 mg/dL (ref 1.5–2.5)

## 2022-08-01 LAB — TSH+FREE T4: TSH W/REFLEX TO FT4: 1.22 mIU/L

## 2022-08-01 NOTE — Progress Notes (Signed)
Heidi Carney,   Vitamin D is low normal. Make sure taking at least 2000 units a day with dairy to help with absorption.  B12 looks great. Keep at every 2 months dosing with shots.  Magnesium in normal range.  Thyroid looks great.  Kidney, liver, glucose looks great.  Hemoglobin looks good.

## 2022-08-23 NOTE — Progress Notes (Unsigned)
Office Visit Note  Patient: Heidi Carney             Date of Birth: 1966-03-23           MRN: [id number]             PCP: Nolene Ebbs Referring: Jomarie Longs, PA-C Visit Date: 09/06/2022 Occupation: @GUAROCC @  Subjective:  Sicca symptoms   History of Present Illness: Heidi Carney is a 57 y.o. female with history of sjogren's syndrome and osteoarthritis.  Patient remains on Plaquenil 200 mg 1 tablet by mouth twice daily Monday through Friday.  She is tolerating Plaquenil without any side effects and has not missed any doses recently.  Patient continues to have chronic sicca symptoms which seem to wax and wane.  She has been using ointment for her eyes at night and gel lubricating drops during the day for dry eyes as well as lozenges as needed for mouth dryness.  She denies any swollen lymph nodes.  She continues to see the dentist every 6 months and ophthalmology at least on a yearly basis. Patient states that she has had some increased stiffness in both hands but denies any joint swelling.  She has occasional discomfort and stiffness in her lower back especially after standing for prolonged periods of time.  At times she has symptoms of sciatica.  Her lower back pain and stiffness typically improves with stretching exercises.   Activities of Daily Living:  Patient reports morning stiffness for a couple of hours  Patient Reports nocturnal pain.  Difficulty dressing/grooming: Denies Difficulty climbing stairs: Denies Difficulty getting out of chair: Denies Difficulty using hands for taps, buttons, cutlery, and/or writing: Denies  Review of Systems  Constitutional:  Positive for fatigue.  HENT:  Positive for mouth dryness. Negative for mouth sores and nose dryness.   Eyes:  Positive for dryness. Negative for pain and visual disturbance.  Respiratory:  Negative for cough, hemoptysis, shortness of breath and difficulty breathing.   Cardiovascular:  Negative for chest  pain, palpitations, hypertension and swelling in legs/feet.  Gastrointestinal:  Positive for constipation. Negative for blood in stool and diarrhea.  Endocrine: Negative for increased urination.  Genitourinary:  Negative for painful urination.  Musculoskeletal:  Positive for joint pain, joint pain and morning stiffness. Negative for joint swelling, myalgias, muscle weakness, muscle tenderness and myalgias.  Skin:  Negative for color change, pallor, rash, hair loss, nodules/bumps, skin tightness, ulcers and sensitivity to sunlight.  Allergic/Immunologic: Negative for susceptible to infections.  Neurological:  Negative for dizziness, numbness, headaches and weakness.  Hematological:  Negative for swollen glands.  Psychiatric/Behavioral:  Negative for depressed mood and sleep disturbance. The patient is nervous/anxious.     PMFS History:  Patient Active Problem List   Diagnosis Date Noted   Muscle cramp 07/31/2022   Vaginal dryness 07/31/2022   Osteopenia 08/03/2021   Sulfite allergy 05/21/2020   Adverse reaction to food, subsequent encounter 03/30/2020   Haglund's deformity of left heel 02/06/2020   Stress 02/06/2020   Itching 02/06/2020   Myopia with presbyopia of both eyes 05/17/2018   Sjogren's syndrome with keratoconjunctivitis sicca (HCC) 04/30/2018   Family history of psoriatic arthritis 04/08/2018   Family history of morphea 04/08/2018   Allergic urticaria 02/06/2018   Angioedema 02/06/2018   Food intolerance 02/06/2018   Chronic rhinitis 02/06/2018   Increased pressure in the eye, left 01/02/2018   Colon polyp 12/18/2017   Grade I internal hemorrhoids 12/18/2017   Bouchard's nodes 12/17/2017  Joint stiffness 12/17/2017   Primary osteoarthritis of both hands 12/17/2017   Surgical 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 replacement therapy 12/17/2017   Multiple food allergies 07/12/2017   Nausea 07/07/2017   Intractable cyclical vomiting with nausea 07/07/2017   Diarrhea 07/07/2017    Abdominal cramping 07/07/2017   Allergic reaction 07/07/2017   Ovarian mass 05/31/2017   Grade II hemorrhoids 04/05/2017   Change in stool 02/12/2017   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   Left lower quadrant pain 01/24/2017   Slow transit constipation 01/24/2017   Right ovarian cyst 01/24/2017   Essential hypertension 01/09/2017   Migraine headache without aura 07/10/2016   Hair loss 11/16/2015   RLS (restless legs syndrome) 11/16/2015   B12 deficiency 11/16/2015   Vitamin D deficiency 11/16/2015   Perimenopausal symptoms 09/13/2015   Overweight 03/17/2015   Seborrheic dermatitis 03/10/2015   GAD (generalized anxiety disorder) 01/26/2015   Elevated blood pressure reading 01/26/2015   Thyroid activity decreased 01/26/2015    Past Medical History:  Diagnosis Date   Anxiety    Arthritis    H/O total vaginal hysterectomy 05/2017   Hypothyroidism    Sjogren's disease (HCC)     Family History  Problem Relation Age of Onset   Diabetes Father    Cancer Father        prostate CA   Stroke Father    Glaucoma Mother    Asthma Sister    Psoriasis Sister        psoriatic arthritis   Arthritis Sister    Scleroderma Sister    Eczema Sister    Asthma Sister    Healthy Daughter    Angioedema Neg Hx    Immunodeficiency Neg Hx    Urticaria Neg Hx    Past Surgical History:  Procedure Laterality Date   CESAREAN SECTION  2012   EYE SURGERY     LAPAROSCOPIC TOTAL HYSTERECTOMY  05/2017   MOHS SURGERY  03/08/2022   forehead   Social History   Social History Narrative   Not on file   Immunization History  Administered Date(s) Administered   Influenza Inj Mdck Quad Pf 01/08/2019   Influenza, Quadrivalent, Recombinant, Inj, Pf 12/21/2016, 03/15/2018   Influenza,inj,Quad PF,6+ Mos 03/17/2015, 02/02/2016   PFIZER(Purple Top)SARS-COV-2 Vaccination 05/30/2019, 06/27/2019, 01/09/2020   Tdap 07/27/2021     Objective: Vital Signs: BP 134/83 (BP Location: Left Arm, Patient  Position: Sitting, Cuff Size: Normal)   Pulse 77   Resp 15   Ht 5\' 4"  (1.626 m)   Wt 168 lb (76.2 kg)   LMP 01/21/2015   BMI 28.84 kg/m    Physical Exam Vitals and nursing note reviewed.  Constitutional:      Appearance: She is well-developed.  HENT:     Head: Normocephalic and atraumatic.  Eyes:     Conjunctiva/sclera: Conjunctivae normal.  Cardiovascular:     Rate and Rhythm: Normal rate and regular rhythm.     Heart sounds: Normal heart sounds.  Pulmonary:     Effort: Pulmonary effort is normal.     Breath sounds: Normal breath sounds.  Abdominal:     General: Bowel sounds are normal.     Palpations: Abdomen is soft.  Musculoskeletal:     Cervical back: Normal range of motion.  Lymphadenopathy:     Cervical: No cervical adenopathy.  Skin:    General: Skin is warm and dry.     Capillary Refill: Capillary refill takes less than 2 seconds.  Neurological:     Mental  Status: She is alert and oriented to person, place, and time.  Psychiatric:        Behavior: Behavior normal.      Musculoskeletal Exam: C-spine, thoracic spine, lumbar spine have good range of motion.  Shoulder joints, elbow joints, wrist joints, MCPs, PIPs, DIPs have good range of motion with no synovitis.  PIP and DIP thickening consistent with osteoarthritis of both hands.  Complete fist formation noted bilaterally.  Hip joints have good range of motion with no groin pain.  No tenderness over the trochanteric bursa bilaterally.  Knee joints have good range of motion with no warmth or effusion.  Ankle joints have good range of motion with no tenderness or joint swelling.  CDAI Exam: CDAI Score: -- Patient Global: --; Provider Global: -- Swollen: --; Tender: -- Joint Exam 09/06/2022   No joint exam has been documented for this visit   There is currently no information documented on the homunculus. Go to the Rheumatology activity and complete the homunculus joint exam.  Investigation: No additional  findings.  Imaging: No results found.  Recent Labs: Lab Results  Component Value Date   WBC 4.4 07/31/2022   HGB 14.4 07/31/2022   PLT 164 07/31/2022   NA 140 07/31/2022   K 4.3 07/31/2022   CL 103 07/31/2022   CO2 30 07/31/2022   GLUCOSE 89 07/31/2022   BUN 9 07/31/2022   CREATININE 0.88 07/31/2022   BILITOT 0.5 07/31/2022   ALKPHOS 87 02/06/2018   AST 22 07/31/2022   ALT 20 07/31/2022   PROT 7.2 07/31/2022   ALBUMIN 4.4 02/06/2018   CALCIUM 9.1 07/31/2022   GFRAA 86 07/06/2020    Speciality Comments: PLQ Eye Exam 06/13/2022 Normal @ [facility] f/u 6 months   Procedures:  No procedures performed Allergies: Shrimp [shellfish allergy], Celexa [citalopram hydrobromide], Elemental sulfur, Sulfur, Macrobid [nitrofurantoin], Sulfites, Synthroid [levothyroxine sodium], Z-pak [azithromycin], and Shrimp extract     Assessment / Plan:     Visit Diagnoses: Sjogren's syndrome with keratoconjunctivitis sicca (HCC) - Sicca symptoms with positive ANA and positive Ro: She continues to have chronic sicca symptoms which seem to wax and wane.  She has been using gel lubricating drops and ointment for dry eyes as well as lozenges for dry mouth.  No cervical lymphadenopathy was palpable.  No parotid swelling or tenderness.  She has been keeping up with a dentist every 6 months and ophthalmology at least on a yearly basis.  She continues to take Plaquenil 200 mg 1 tablet by mouth twice daily Monday through Friday.  She is tolerating Plaquenil without any side effects and has not missed any doses recently.  She has no signs of inflammatory arthritis at this time.  No active synovitis was noted.  She has had some increased stiffness in both hands due to underlying osteoarthritis.  Discussed the importance of joint protection and muscle strengthening. Discussed the increased risk for developing lymphoma in patients with Sjogren's syndrome. Plan to check SPEP with next lab  work.  Lab work from 07/17/22 was reviewed today in the office: dsDNA negative, ESR WNL, complements WNL, Ro antibody is positive.  Her next lab work will be due in September.  Future orders were placed today.  She will remain on Plaquenil as prescribed.  She was advised to notify us if she develops any new or worsening symptoms.  She will follow-up in the office in 5 months or sooner if needed. - Plan: Protein / creatinine ratio, urine, CBC with  Differential/Platelet, COMPLETE METABOLIC PANEL WITH GFR, Anti-DNA antibody, double-stranded, C3 and C4, Sedimentation rate, ANA, Serum protein electrophoresis with reflex  High risk medication use - Plaquenil 200 mg 1 tablet by mouth twice daily Monday through Friday.  CBC and CMP updated on 07/31/22.  Future orders for CBC and CMP replaced today. PLQ Eye Exam 06/13/2022 Normal @ [facility] f/u 6 months.   - Plan: CBC with Differential/Platelet, COMPLETE METABOLIC PANEL WITH GFR  Primary osteoarthritis of both hands: She has PIP and DIP thickening consistent with osteoarthritis of both hands.  She is been experiencing some ankle stiffness in both hands which he attributes to an increase in activity level.  She has no synovitis or dactylitis on examination today.  Discussed the importance of joint protection and muscle strengthening.  Primary osteoarthritis of both feet: She is not experiencing any increased discomfort in her feet at this time.  She is wearing proper fitting shoes.   Trapezius muscle spasm: Not currently symptomatic.   Other fatigue: Chronic, stable.  Osteopenia of multiple sites - DEXA scan from Aug 03, 2021 was reviewed which showed T-score of -2.0, BMD 0.767 in the right femoral neck consistent with osteopenia. Due to update DEXA in May 2025.  Vitamin D insufficiency  Other medical conditions are listed as follows:  Essential hypertension: BP 134/83 today in the office.   Increased pressure in the eye,  left  Migraine without aura and without status migrainosus, not intractable  GAD (generalized anxiety disorder)  RLS (restless legs syndrome)  Seborrheic dermatitis  Allergic urticaria  Vitamin B12 deficiency  History of hypothyroidism  Family history of morphea  Family history of psoriatic arthritis  Orders: Orders Placed This Encounter  Procedures   Protein / creatinine ratio, urine   CBC with Differential/Platelet   COMPLETE METABOLIC PANEL WITH GFR   Anti-DNA antibody, double-stranded   C3 and C4   Sedimentation rate   ANA   Serum protein electrophoresis with reflex   No orders of the defined types were placed in this encounter.   Follow-Up Instructions: Return in about 5 months (around 02/06/2023) for Sjogren's syndrome, Osteoarthritis.   Gearldine Bienenstock, PA-C  Note - This record has been created using Dragon software.  Chart creation errors have been sought, but may not always  have been located. Such creation errors do not reflect on  the standard of medical care.

## 2022-08-31 ENCOUNTER — Other Ambulatory Visit: Payer: Self-pay | Admitting: Physician Assistant

## 2022-08-31 DIAGNOSIS — E039 Hypothyroidism, unspecified: Secondary | ICD-10-CM

## 2022-09-06 ENCOUNTER — Ambulatory Visit: Payer: BC Managed Care – PPO | Attending: Physician Assistant | Admitting: Physician Assistant

## 2022-09-06 ENCOUNTER — Encounter: Payer: Self-pay | Admitting: Physician Assistant

## 2022-09-06 VITALS — BP 134/83 | HR 77 | Resp 15 | Ht 64.0 in | Wt 168.0 lb

## 2022-09-06 DIAGNOSIS — R5383 Other fatigue: Secondary | ICD-10-CM

## 2022-09-06 DIAGNOSIS — Z79899 Other long term (current) drug therapy: Secondary | ICD-10-CM

## 2022-09-06 DIAGNOSIS — M19071 Primary osteoarthritis, right ankle and foot: Secondary | ICD-10-CM | POA: Diagnosis not present

## 2022-09-06 DIAGNOSIS — M62838 Other muscle spasm: Secondary | ICD-10-CM

## 2022-09-06 DIAGNOSIS — M8589 Other specified disorders of bone density and structure, multiple sites: Secondary | ICD-10-CM

## 2022-09-06 DIAGNOSIS — G2581 Restless legs syndrome: Secondary | ICD-10-CM

## 2022-09-06 DIAGNOSIS — Z8639 Personal history of other endocrine, nutritional and metabolic disease: Secondary | ICD-10-CM

## 2022-09-06 DIAGNOSIS — L219 Seborrheic dermatitis, unspecified: Secondary | ICD-10-CM

## 2022-09-06 DIAGNOSIS — E538 Deficiency of other specified B group vitamins: Secondary | ICD-10-CM

## 2022-09-06 DIAGNOSIS — I10 Essential (primary) hypertension: Secondary | ICD-10-CM

## 2022-09-06 DIAGNOSIS — M19041 Primary osteoarthritis, right hand: Secondary | ICD-10-CM | POA: Diagnosis not present

## 2022-09-06 DIAGNOSIS — M3501 Sicca syndrome with keratoconjunctivitis: Secondary | ICD-10-CM

## 2022-09-06 DIAGNOSIS — E559 Vitamin D deficiency, unspecified: Secondary | ICD-10-CM

## 2022-09-06 DIAGNOSIS — M19042 Primary osteoarthritis, left hand: Secondary | ICD-10-CM

## 2022-09-06 DIAGNOSIS — H40052 Ocular hypertension, left eye: Secondary | ICD-10-CM

## 2022-09-06 DIAGNOSIS — F411 Generalized anxiety disorder: Secondary | ICD-10-CM

## 2022-09-06 DIAGNOSIS — L500 Allergic urticaria: Secondary | ICD-10-CM

## 2022-09-06 DIAGNOSIS — M19072 Primary osteoarthritis, left ankle and foot: Secondary | ICD-10-CM

## 2022-09-06 DIAGNOSIS — G43009 Migraine without aura, not intractable, without status migrainosus: Secondary | ICD-10-CM

## 2022-09-06 DIAGNOSIS — Z840 Family history of diseases of the skin and subcutaneous tissue: Secondary | ICD-10-CM

## 2022-09-06 NOTE — Patient Instructions (Signed)
Standing Labs We placed an order today for your standing lab work.   Please have your standing labs drawn in September    Please have your labs drawn 2 weeks prior to your appointment so that the provider can discuss your lab results at your appointment, if possible.  Please note that you may see your imaging and lab results in MyChart before we have reviewed them. We will contact you once all results are reviewed. Please allow our office up to 72 hours to thoroughly review all of the results before contacting the office for clarification of your results.  WALK-IN LAB HOURS  Monday through Thursday from 8:00 am -12:30 pm and 1:00 pm-5:00 pm and Friday from 8:00 am-12:00 pm.  Patients with office visits requiring labs will be seen before walk-in labs.  You may encounter longer than normal wait times. Please allow additional time. Wait times may be shorter on  Monday and Thursday afternoons.  We do not book appointments for walk-in labs. We appreciate your patience and understanding with our staff.   Labs are drawn by [facility]. Please bring your co-pay at the time of your lab draw.  You may receive a bill from [facility] for your lab work.  Please note if you are on Hydroxychloroquine and and an order has been placed for a Hydroxychloroquine level,  you will need to have it drawn 4 hours or more after your last dose.  If you wish to have your labs drawn at another location, please call the office 24 hours in advance so we can fax the orders.  The office is located at [address], [address], [place], [place] [address]   If you have any questions regarding directions or hours of operation,  please call [phone].   As a reminder, please drink plenty of water prior to coming for your lab work. Thanks!

## 2022-09-18 ENCOUNTER — Encounter: Payer: Self-pay | Admitting: Physician Assistant

## 2022-09-18 DIAGNOSIS — E538 Deficiency of other specified B group vitamins: Secondary | ICD-10-CM

## 2022-09-18 MED ORDER — CYANOCOBALAMIN 1000 MCG/ML IJ SOLN
1000.0000 ug | INTRAMUSCULAR | 1 refills | Status: DC
Start: 2022-09-18 — End: 2023-03-16

## 2022-09-24 ENCOUNTER — Other Ambulatory Visit: Payer: Self-pay | Admitting: Rheumatology

## 2022-09-25 NOTE — Telephone Encounter (Signed)
Last Fill: 07/03/2022  Eye exam: 06/13/2022 Normal    Labs: 07/31/2022 MCV 101.7, MCH 35.0  Next Visit: 02/06/2023  Last Visit: 09/06/2022  [id number] syndrome with keratoconjunctivitis sicca   Current Dose per office note 09/06/2022: Plaquenil 200 mg 1 tablet by mouth twice daily Monday through Friday.   Okay to refill Plaquenil?

## 2022-12-07 ENCOUNTER — Encounter: Payer: Self-pay | Admitting: Physician Assistant

## 2022-12-07 ENCOUNTER — Other Ambulatory Visit: Payer: Self-pay | Admitting: Physician Assistant

## 2022-12-07 DIAGNOSIS — E039 Hypothyroidism, unspecified: Secondary | ICD-10-CM

## 2022-12-07 MED ORDER — TIROSINT 88 MCG PO CAPS
ORAL_CAPSULE | ORAL | 2 refills | Status: DC
Start: 2022-12-07 — End: 2022-12-08

## 2022-12-07 MED ORDER — LEVOTHYROXINE SODIUM 75 MCG PO CAPS
ORAL_CAPSULE | ORAL | 2 refills | Status: DC
Start: 2022-12-07 — End: 2022-12-08

## 2022-12-08 ENCOUNTER — Other Ambulatory Visit: Payer: Self-pay | Admitting: Physician Assistant

## 2022-12-08 DIAGNOSIS — E039 Hypothyroidism, unspecified: Secondary | ICD-10-CM

## 2022-12-08 MED ORDER — LEVOTHYROXINE SODIUM 75 MCG PO CAPS: ORAL_CAPSULE | ORAL | 2 refills | Status: DC

## 2022-12-08 MED ORDER — TIROSINT 88 MCG PO CAPS: ORAL_CAPSULE | ORAL | 2 refills | Status: DC

## 2022-12-11 ENCOUNTER — Encounter: Payer: Self-pay | Admitting: Physician Assistant

## 2022-12-11 NOTE — Telephone Encounter (Signed)
Yes ok for letter for autoimmune disease that causes fatigue, stress, brain fog, and care that would provide frequent interrupts to court case.

## 2022-12-16 ENCOUNTER — Other Ambulatory Visit: Payer: Self-pay | Admitting: Physician Assistant

## 2022-12-18 NOTE — Telephone Encounter (Signed)
Last Fill: 09/25/2022  Eye exam: 06/13/2022 Normal    Labs: 07/31/2022 MCV 101.7, MCH 35.0  Next Visit: 02/06/2023  Last Visit: 09/06/2022  [id number] syndrome with keratoconjunctivitis sicca   Current Dose per office note 09/06/2022: Plaquenil 200 mg 1 tablet by mouth twice daily Monday through Friday.   Okay to refill Plaquenil?

## 2023-01-17 DIAGNOSIS — M3501 Sicca syndrome with keratoconjunctivitis: Secondary | ICD-10-CM

## 2023-01-17 DIAGNOSIS — Z79899 Other long term (current) drug therapy: Secondary | ICD-10-CM

## 2023-01-18 NOTE — Addendum Note (Signed)
Addended by: Henriette Combs on: 01/18/2023 12:13 PM   Modules accepted: Orders

## 2023-01-18 NOTE — Addendum Note (Signed)
Addended by: Henriette Combs on: 01/18/2023 12:10 PM   Modules accepted: Orders

## 2023-01-23 NOTE — Progress Notes (Unsigned)
Office Visit Note  Patient: Heidi Carney             Date of Birth: [birth year]           MRN: [id number]             PCP: Nolene Ebbs Referring: Jomarie Longs, PA-C Visit Date: 02/06/2023 Occupation: @GUAROCC @  Subjective:  Medication monitoring   History of Present Illness: Heidi Carney is a 57 y.o. female with history of sjogren's syndrome and osteoarthritis.  Patient remains on Plaquenil 200 mg 1 tablet by mouth twice daily Monday through Friday.  She continues to tolerate Plaquenil without any side effects and has not had any gaps in therapy.  Patient states that she had a flare at the end of August until mid October at which time she was experiencing increased fatigue and brain fog.  Patient attributes the recent flare to being under increased stress getting ready for school to restart as well as overdoing it with her activity level.  Patient states that over the past week her symptoms have improved and she has been feeling much better.  Patient continues to have occasional aching in both hands and both feet especially in the evenings.  She denies any joint swelling.  Patient continues to have chronic sicca symptoms which have been manageable overall.  She continues to see the dentist and ophthalmologist every 6 months.  She uses a humidifier seasonally which has been helpful.  She denies any swollen lymph nodes.  She denies any new or worsening pulmonary symptoms.   Activities of Daily Living:  Patient reports morning stiffness for 0  none .   Patient Reports nocturnal pain.  Difficulty dressing/grooming: Denies Difficulty climbing stairs: Denies Difficulty getting out of chair: Denies Difficulty using hands for taps, buttons, cutlery, and/or writing: Denies  Review of Systems  Constitutional:  Positive for fatigue.  HENT:  Positive for mouth dryness. Negative for mouth sores.   Eyes:  Positive for dryness.  Respiratory:  Negative for shortness of breath.    Cardiovascular:  Negative for chest pain and palpitations.  Gastrointestinal:  Positive for constipation. Negative for blood in stool and diarrhea.  Endocrine: Negative for increased urination.  Genitourinary:  Negative for involuntary urination.  Musculoskeletal:  Positive for joint pain, joint pain and muscle weakness. Negative for gait problem, joint swelling, myalgias, morning stiffness, muscle tenderness and myalgias.  Skin:  Negative for color change, rash, hair loss and sensitivity to sunlight.  Allergic/Immunologic: Negative for susceptible to infections.  Neurological:  Positive for headaches. Negative for dizziness.  Hematological:  Negative for swollen glands.  Psychiatric/Behavioral:  Positive for sleep disturbance. Negative for depressed mood. The patient is nervous/anxious.     PMFS History:  Patient Active Problem List   Diagnosis Date Noted   Muscle cramp 07/31/2022   Vaginal dryness 07/31/2022   Osteopenia 08/03/2021   Sulfite allergy 05/21/2020   Adverse reaction to food, subsequent encounter 03/30/2020   Haglund's deformity of left heel 02/06/2020   Stress 02/06/2020   Itching 02/06/2020   Myopia with presbyopia of both eyes 05/17/2018   Sjogren's syndrome with keratoconjunctivitis sicca (HCC) 04/30/2018   Family history of psoriatic arthritis 04/08/2018   Family history of morphea 04/08/2018   Allergic urticaria 02/06/2018   Angioedema 02/06/2018   Food intolerance 02/06/2018   Chronic rhinitis 02/06/2018   Increased pressure in the eye, left 01/02/2018   Colon polyp 12/18/2017   Grade I internal hemorrhoids 12/18/2017   Bouchard's  nodes 12/17/2017   Joint stiffness 12/17/2017   Primary osteoarthritis of both hands 12/17/2017   Surgical 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 replacement therapy 12/17/2017   Multiple food allergies 07/12/2017   Nausea 07/07/2017   Intractable cyclical vomiting with nausea 07/07/2017   Diarrhea 07/07/2017   Abdominal cramping 07/07/2017    Allergic reaction 07/07/2017   Ovarian mass 05/31/2017   Grade II hemorrhoids 04/05/2017   Change in stool 02/12/2017   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   Left lower quadrant pain 01/24/2017   Slow transit constipation 01/24/2017   Right ovarian cyst 01/24/2017   Essential hypertension 01/09/2017   Migraine headache without aura 07/10/2016   Hair loss 11/16/2015   RLS (restless legs syndrome) 11/16/2015   B12 deficiency 11/16/2015   Vitamin D deficiency 11/16/2015   Perimenopausal symptoms 09/13/2015   Overweight 03/17/2015   Seborrheic dermatitis 03/10/2015   GAD (generalized anxiety disorder) 01/26/2015   Elevated blood pressure reading 01/26/2015   Thyroid activity decreased 01/26/2015    Past Medical History:  Diagnosis Date   Anxiety    Arthritis    H/O total vaginal hysterectomy 05/2017   Hypothyroidism    Sjogren's disease (HCC)     Family History  Problem Relation Age of Onset   Diabetes Father    Cancer Father        prostate CA   Stroke Father    Glaucoma Mother    Asthma Sister    Psoriasis Sister        psoriatic arthritis   Arthritis Sister    Scleroderma Sister    Eczema Sister    Asthma Sister    Healthy Daughter    Angioedema Neg Hx    Immunodeficiency Neg Hx    Urticaria Neg Hx    Past Surgical History:  Procedure Laterality Date   CESAREAN SECTION  2012   EYE SURGERY     LAPAROSCOPIC TOTAL HYSTERECTOMY  05/2017   MOHS SURGERY  03/08/2022   forehead   Social History   Social History Narrative   Not on file   Immunization History  Administered Date(s) Administered   Influenza Inj Mdck Quad Pf 01/08/2019   Influenza, Quadrivalent, Recombinant, Inj, Pf 12/21/2016, 03/15/2018   Influenza,inj,Quad PF,6+ Mos 03/17/2015, 02/02/2016   PFIZER(Purple Top)SARS-COV-2 Vaccination 05/30/2019, 06/27/2019, 01/09/2020   Tdap 07/27/2021     Objective: Vital Signs: BP 127/79 (BP Location: Left Arm, Patient Position: Sitting, Cuff Size: Normal)    Pulse 77   Resp 14   Ht 5\' 4"  (1.626 m)   Wt 165 lb (74.8 kg)   LMP 01/21/2015   BMI 28.32 kg/m    Physical Exam Vitals and nursing note reviewed.  Constitutional:      Appearance: She is well-developed.  HENT:     Head: Normocephalic and atraumatic.  Eyes:     Conjunctiva/sclera: Conjunctivae normal.  Cardiovascular:     Rate and Rhythm: Normal rate and regular rhythm.     Heart sounds: Normal heart sounds.  Pulmonary:     Effort: Pulmonary effort is normal.     Breath sounds: Normal breath sounds.  Abdominal:     General: Bowel sounds are normal.     Palpations: Abdomen is soft.  Musculoskeletal:     Cervical back: Normal range of motion.  Lymphadenopathy:     Cervical: No cervical adenopathy.  Skin:    General: Skin is warm and dry.     Capillary Refill: Capillary refill takes less than 2 seconds.  Neurological:  Mental Status: She is alert and oriented to person, place, and time.  Psychiatric:        Behavior: Behavior normal.      Musculoskeletal Exam: C-spine, thoracic spine, and lumbar spine good ROM.  No midline spinal tenderness or SI joint tenderness.  Shoulder joints, elbow joints, wrist joints, MCPs, PIPs, and DIPs good ROM with no synovitis.  Complete fist formation bilaterally.  Hip joints have good ROM with no groin pain.  Knee joints have good ROM with no warmth or effusion.  Ankle joints have good ROM with no tenderness or joint swelling.  CDAI Exam: CDAI Score: -- Patient Global: --; Provider Global: -- Swollen: --; Tender: -- Joint Exam 02/06/2023   No joint exam has been documented for this visit   There is currently no information documented on the homunculus. Go to the Rheumatology activity and complete the homunculus joint exam.  Investigation: No additional findings.  Imaging: No results found.  Recent Labs: Lab Results  Component Value Date   WBC 5.0 01/30/2023   HGB 14.0 01/30/2023   PLT 179 01/30/2023   NA 138 01/30/2023    K 3.9 01/30/2023   CL 99 01/30/2023   CO2 25 01/30/2023   GLUCOSE 83 01/30/2023   BUN 7 01/30/2023   CREATININE 1.02 (H) 01/30/2023   BILITOT 0.5 01/30/2023   ALKPHOS 95 01/30/2023   AST 26 01/30/2023   ALT 22 01/30/2023   PROT 7.1 01/30/2023   ALBUMIN 4.4 01/30/2023   CALCIUM 9.1 01/30/2023   GFRAA 86 07/06/2020    Speciality Comments: PLQ Eye Exam 06/13/2022 Normal @ [facility] f/u 6 months   Procedures:  No procedures performed Allergies: Shrimp [shellfish allergy], Celexa [citalopram hydrobromide], Elemental sulfur, Sulfur, Macrobid [nitrofurantoin], Sulfites, Synthroid [levothyroxine sodium], Z-pak [azithromycin], and Shrimp extract     Assessment / Plan:     Visit Diagnoses: Sjogren's syndrome with keratoconjunctivitis sicca (HCC) - Sicca symptoms with positive ANA and positive Ro: She continues to have chronic sicca symptoms which have been manageable.  She continues to see the dentist and ophthalmologist every 6 months.  No cervical lymphadenopathy was noted on examination today.  No parotid swelling or tenderness.  Patient uses a humidifier seasonally which has been helpful.  She continues to take Plaquenil 200 mg 1 tablet by mouth twice daily Monday through Friday.  She is tolerating Plaquenil without any side effects. She experiences intermittent bouts of fatigue and brain fog typically exacerbated by life stressors.  She has been trying to walk on a regular basis for exercise.  Her energy level has improved over the past 1 week. She has no synovitis on examination today.  She experiences intermittent arthralgias in both hands and both feet but has no active inflammation.  She has not had any new or worsening pulmonary symptoms.  Lab work from 01/30/23 was reviewed today in the office: ANA negative, ESR WNL, dsDNA negative, complements WNL, SPEP WNL, and protein creatinine ratio WNL.  She will remain on Plaquenil as prescribed. Discussed the  increased risk for developing ILD and lymphoma in patients with Sjogren's syndrome.  Plan to continue to monitor for symptoms closely.  She will follow-up in the office in 5 months or sooner if needed.  High risk medication use - Plaquenil 200 mg 1 tablet by mouth twice daily Monday through Friday. PLQ Eye Exam 06/13/2022 Normal @ [facility]-  CBC and CMP updated on 01/30/23.   Primary osteoarthritis of both hands: Patient has PIP  and DIP thickening consistent with osteoarthritis of both hands.  No synovitis noted.  Complete fist formation bilaterally.  Primary osteoarthritis of both feet: She experiences intermittent aching in both feet especially in the evenings.  Both ankle joints have good range of motion with no tenderness or synovitis.  Some thickening over the bilateral first MTP joints but no synovitis noted.  Trapezius muscle spasm: No muscle spasms currently.   Other fatigue: Patient recently had a flare at which time she was experiencing increased fatigue and brain fog.  Vitamin D, vitamin B12, folate, TSH, and free T4 were within normal limits on 01/30/2023.  Her symptoms have significantly proved over the past 1 week.  Osteopenia of multiple sites - DEXA scan from Aug 03, 2021 was reviewed which showed T-score of -2.0, BMD 0.767 in the right femoral neck consistent with osteopenia. Vitamin D within normal limits 46.6 on 01/30/2023.  She is taking vitamin D supplement daily. Due to update DEXA in May 2025.  Vitamin D insufficiency: Vitamin D was 46.6 on 01/30/2023.  Other medical conditions are listed as follows:   Essential hypertension: Blood pressure was elevated initially when presenting to the office but once rechecked and returned to 127/79.  Increased pressure in the eye, left  Migraine without aura and without status migrainosus, not intractable  GAD (generalized anxiety disorder)  RLS (restless legs syndrome)  Allergic  urticaria  Vitamin B12 deficiency  History of hypothyroidism  Family history of morphea  Family history of psoriatic arthritis  Seborrheic dermatitis  Orders: No orders of the defined types were placed in this encounter.  No orders of the defined types were placed in this encounter.     Follow-Up Instructions: Return in about 5 months (around 07/07/2023) for Sjogren's syndrome.   Gearldine Bienenstock, PA-C  Note - This record has been created using Dragon software.  Chart creation errors have been sought, but may not always  have been located. Such creation errors do not reflect on  the standard of medical care.

## 2023-01-30 ENCOUNTER — Encounter: Payer: Self-pay | Admitting: Physician Assistant

## 2023-01-30 ENCOUNTER — Ambulatory Visit: Payer: BC Managed Care – PPO | Admitting: Physician Assistant

## 2023-01-30 VITALS — BP 142/71 | HR 93 | Ht 64.0 in | Wt 166.0 lb

## 2023-01-30 DIAGNOSIS — M3501 Sicca syndrome with keratoconjunctivitis: Secondary | ICD-10-CM | POA: Diagnosis not present

## 2023-01-30 DIAGNOSIS — Z13220 Encounter for screening for lipoid disorders: Secondary | ICD-10-CM

## 2023-01-30 DIAGNOSIS — I10 Essential (primary) hypertension: Secondary | ICD-10-CM | POA: Diagnosis not present

## 2023-01-30 DIAGNOSIS — E559 Vitamin D deficiency, unspecified: Secondary | ICD-10-CM

## 2023-01-30 DIAGNOSIS — E039 Hypothyroidism, unspecified: Secondary | ICD-10-CM

## 2023-01-30 DIAGNOSIS — E538 Deficiency of other specified B group vitamins: Secondary | ICD-10-CM

## 2023-01-30 DIAGNOSIS — F411 Generalized anxiety disorder: Secondary | ICD-10-CM

## 2023-01-30 DIAGNOSIS — F439 Reaction to severe stress, unspecified: Secondary | ICD-10-CM

## 2023-01-30 NOTE — Progress Notes (Unsigned)
Established Patient Office Visit  Subjective   Patient ID: Heidi Carney, female    DOB: 1965-08-05  Age: 57 y.o. MRN: [id number]  Chief Complaint  Patient presents with   Medical Management of Chronic Issues    6 mo fup     HPI Pt is a 57 yo female with Migraines, Hypothyroidism, Sjogren's, HTN, B12 and vitamin D deficiencies, GAD who presents to the clinic for 6 month follow up.   Pt is taking MVI and b12 shots every 2 months.   Sjogren's dry symptoms are controlled with OTC medications. She is using a new vaginal gel and it is very helpful.   She is taking thyroid medications and denies any concerns.   Mood is doing well. Fatigue is always there and a struggle.   .. Active Ambulatory Problems    Diagnosis Date Noted   GAD (generalized anxiety disorder) 01/26/2015   Elevated blood pressure reading 01/26/2015   Thyroid activity decreased 01/26/2015   Seborrheic dermatitis 03/10/2015   Overweight 03/17/2015   Perimenopausal symptoms 09/13/2015   Hair loss 11/16/2015   RLS (restless legs syndrome) 11/16/2015   B12 deficiency 11/16/2015   Vitamin D deficiency 11/16/2015   Migraine headache without aura 07/10/2016   Essential hypertension 01/09/2017   Left lower quadrant pain 01/24/2017   Slow transit constipation 01/24/2017   Right ovarian cyst 01/24/2017   Change in stool 02/12/2017   Nausea 07/07/2017   Intractable cyclical vomiting with nausea 07/07/2017   Diarrhea 07/07/2017   Abdominal cramping 07/07/2017   Allergic reaction 07/07/2017   Multiple food allergies 07/12/2017   Bouchard's nodes 12/17/2017   Joint stiffness 12/17/2017   Primary osteoarthritis of both hands 12/17/2017   Surgical 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 replacement therapy 12/17/2017   Colon polyp 12/18/2017   Grade I internal hemorrhoids 12/18/2017   Increased pressure in the eye, left 01/02/2018   Allergic urticaria 02/06/2018   Angioedema 02/06/2018   Food intolerance 02/06/2018   Chronic  rhinitis 02/06/2018   Family history of psoriatic arthritis 04/08/2018   Family history of morphea 04/08/2018   Sjogren's syndrome with keratoconjunctivitis sicca (HCC) 04/30/2018   Haglund's deformity of left heel 02/06/2020   Stress 02/06/2020   Itching 02/06/2020   Adverse reaction to food, subsequent encounter 03/30/2020   Sulfite allergy 05/21/2020   Ovarian mass 05/31/2017   Myopia with presbyopia of both eyes 05/17/2018   Grade II hemorrhoids 04/05/2017   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    Objective:     BP (!) 142/71   Pulse 93   Ht 5\' 4"  (1.626 m)   Wt 166 lb (75.3 kg)   LMP 01/21/2015   SpO2 99%   BMI 28.49 kg/m  BP Readings from Last 3 Encounters:  01/30/23 (!) 142/71  09/06/22 134/83  07/31/22 122/78   Wt Readings from Last 3 Encounters:  01/30/23 166 lb (75.3 kg)  09/06/22 168 lb (76.2 kg)  07/31/22 163 lb 1.9 oz (74 kg)      Physical Exam    The 10-year ASCVD  risk score (Arnett DK, et al., 2019) is: 1.9%    Assessment & Plan:  Marland KitchenMarland KitchenRinaldo Cloud "Pam" was seen today for medical management of chronic issues.  Diagnoses and all orders for this visit:  Sjogren's syndrome with keratoconjunctivitis sicca (HCC) -     Lipid panel -     CMP14+EGFR -     VITAMIN D 25 Hydroxy (Vit-D Deficiency, Fractures) -     B12 and Folate Panel -     TSH -     T4, free  Vitamin D deficiency -     VITAMIN D 25 Hydroxy (Vit-D Deficiency, Fractures)  B12 deficiency -     B12 and Folate Panel  Essential hypertension -     CMP14+EGFR  Acquired hypothyroidism -     TSH -      T4, free  Screening for lipid disorders -     Lipid panel  Deficiency of other specified B group vitamins -     B12 and Folate Panel      Tandy Gaw, PA-C

## 2023-01-31 ENCOUNTER — Encounter: Payer: Self-pay | Admitting: Physician Assistant

## 2023-01-31 LAB — CMP14+EGFR
ALT: 22 [IU]/L (ref 0–32)
AST: 26 [IU]/L (ref 0–40)
Albumin: 4.4 g/dL (ref 3.8–4.9)
Alkaline Phosphatase: 95 [IU]/L (ref 44–121)
BUN/Creatinine Ratio: 7 — ABNORMAL LOW (ref 9–23)
BUN: 7 mg/dL (ref 6–24)
Bilirubin Total: 0.5 mg/dL (ref 0.0–1.2)
CO2: 25 mmol/L (ref 20–29)
Calcium: 9.1 mg/dL (ref 8.7–10.2)
Chloride: 99 mmol/L (ref 96–106)
Creatinine, Ser: 1.02 mg/dL — ABNORMAL HIGH (ref 0.57–1.00)
Globulin, Total: 2.7 g/dL (ref 1.5–4.5)
Glucose: 83 mg/dL (ref 70–99)
Potassium: 3.9 mmol/L (ref 3.5–5.2)
Sodium: 138 mmol/L (ref 134–144)
Total Protein: 7.1 g/dL (ref 6.0–8.5)
eGFR: 65 mL/min/{1.73_m2} (ref >59)

## 2023-01-31 LAB — TSH: TSH: 2.72 u[IU]/mL (ref 0.450–4.500)

## 2023-01-31 LAB — LIPID PANEL
Chol/HDL Ratio: 3.4 ratio (ref 0.0–4.4)
Cholesterol, Total: 187 mg/dL (ref 100–199)
HDL: 55 mg/dL (ref >39)
LDL Chol Calc (NIH): 106 mg/dL — ABNORMAL HIGH (ref 0–99)
Triglycerides: 146 mg/dL (ref 0–149)
VLDL Cholesterol Cal: 26 mg/dL (ref 5–40)

## 2023-01-31 LAB — VITAMIN D 25 HYDROXY (VIT D DEFICIENCY, FRACTURES): Vit D, 25-Hydroxy: 46.6 ng/mL (ref 30.0–100.0)

## 2023-01-31 LAB — T4, FREE: Free T4: 1.02 ng/dL (ref 0.82–1.77)

## 2023-01-31 LAB — B12 AND FOLATE PANEL
Folate: >20 ng/mL (ref >3.0)
Vitamin B-12: 1002 pg/mL (ref 232–1245)

## 2023-01-31 MED ORDER — VENLAFAXINE HCL ER 150 MG PO CP24: 150.0000 mg | ORAL_CAPSULE | Freq: Every day | ORAL | 1 refills | Status: DC

## 2023-01-31 NOTE — Progress Notes (Signed)
Pam,   Thyroid looks great! B12 and folate look wonderful.  Vitamin D is good.  Kidney function is stable.  Cholesterol looks good.   Marland Kitchen.The 10-year ASCVD risk score (Arnett DK, et al., 2019) is: 2.3%   Values used to calculate the score:     Age: 57 years     Sex: Female     Is Non-Hispanic African American: No     Diabetic: No     Tobacco smoker: No     Systolic Blood Pressure: 142 mmHg     Is BP treated: No     HDL Cholesterol: 55 mg/dL     Total Cholesterol: 187 mg/dL

## 2023-02-01 NOTE — Progress Notes (Signed)
Creatinine was elevated at 1.03--avoid the use of NSAIDs and increase water intake.  Rest of CMP within normal limits. MCV and MCH are significantly elevated.  Please see if vitamin B12 and folate can be added.  Creatinine ratio within normal limits.  Double-stranded and a negative, complements within normal limits, sed rate 4-WNL.

## 2023-02-02 LAB — CBC WITH DIFFERENTIAL/PLATELET
Basophils Absolute: 0 10*3/uL (ref 0.0–0.2)
Basos: 1 %
EOS (ABSOLUTE): 0.2 10*3/uL (ref 0.0–0.4)
Eos: 4 %
Hematocrit: 40.8 % (ref 34.0–46.6)
Hemoglobin: 14 g/dL (ref 11.1–15.9)
Immature Grans (Abs): 0 10*3/uL (ref 0.0–0.1)
Immature Granulocytes: 0 %
Lymphocytes Absolute: 1.3 10*3/uL (ref 0.7–3.1)
Lymphs: 25 %
MCH: 36.2 pg — ABNORMAL HIGH (ref 26.6–33.0)
MCHC: 34.3 g/dL (ref 31.5–35.7)
MCV: 105 fL — ABNORMAL HIGH (ref 79–97)
Monocytes Absolute: 0.5 10*3/uL (ref 0.1–0.9)
Monocytes: 11 %
Neutrophils Absolute: 2.9 10*3/uL (ref 1.4–7.0)
Neutrophils: 59 %
Platelets: 179 10*3/uL (ref 150–450)
RBC: 3.87 x10E6/uL (ref 3.77–5.28)
RDW: 12.2 % (ref 11.7–15.4)
WBC: 5 10*3/uL (ref 3.4–10.8)

## 2023-02-02 LAB — CMP14+EGFR
ALT: 22 [IU]/L (ref 0–32)
AST: 24 [IU]/L (ref 0–40)
Albumin: 4.4 g/dL (ref 3.8–4.9)
Alkaline Phosphatase: 94 [IU]/L (ref 44–121)
BUN/Creatinine Ratio: 7 — ABNORMAL LOW (ref 9–23)
BUN: 7 mg/dL (ref 6–24)
Bilirubin Total: 0.4 mg/dL (ref 0.0–1.2)
CO2: 24 mmol/L (ref 20–29)
Calcium: 9.2 mg/dL (ref 8.7–10.2)
Chloride: 102 mmol/L (ref 96–106)
Creatinine, Ser: 1.03 mg/dL — ABNORMAL HIGH (ref 0.57–1.00)
Globulin, Total: 2.9 g/dL (ref 1.5–4.5)
Glucose: 80 mg/dL (ref 70–99)
Potassium: 4.5 mmol/L (ref 3.5–5.2)
Sodium: 140 mmol/L (ref 134–144)
Total Protein: 7.3 g/dL (ref 6.0–8.5)
eGFR: 64 mL/min/{1.73_m2} (ref >59)

## 2023-02-02 LAB — PROTEIN ELECTROPHORESIS, SERUM, WITH REFLEX
A/G Ratio: 1.2 (ref 0.7–1.7)
Albumin ELP: 4 g/dL (ref 2.9–4.4)
Alpha 1: 0.2 g/dL (ref 0.0–0.4)
Alpha 2: 0.7 g/dL (ref 0.4–1.0)
Beta: 1.2 g/dL (ref 0.7–1.3)
Gamma Globulin: 1.2 g/dL (ref 0.4–1.8)
Globulin, Total: 3.3 g/dL (ref 2.2–3.9)

## 2023-02-02 LAB — ANTI-DNA ANTIBODY, DOUBLE-STRANDED: dsDNA Ab: <1 [IU]/mL (ref 0–9)

## 2023-02-02 LAB — C3 AND C4
Complement C3, Serum: 146 mg/dL (ref 82–167)
Complement C4, Serum: 28 mg/dL (ref 12–38)

## 2023-02-02 LAB — SEDIMENTATION RATE: Sed Rate: 4 mm/h (ref 0–40)

## 2023-02-02 LAB — PROTEIN / CREATININE RATIO, URINE
Creatinine, Urine: 55.6 mg/dL
Protein, Ur: 6.4 mg/dL
Protein/Creat Ratio: 115 mg/g{creat} (ref 0–200)

## 2023-02-02 LAB — ANTINUCLEAR ANTIBODIES, IFA: ANA Titer 1: NEGATIVE

## 2023-02-02 NOTE — Progress Notes (Signed)
ANA negative  DsDNA negative  SPEP normal

## 2023-02-06 ENCOUNTER — Encounter: Payer: Self-pay | Admitting: Physician Assistant

## 2023-02-06 ENCOUNTER — Ambulatory Visit: Payer: BC Managed Care – PPO | Attending: Physician Assistant | Admitting: Physician Assistant

## 2023-02-06 VITALS — BP 127/79 | HR 77 | Resp 14 | Ht 64.0 in | Wt 165.0 lb

## 2023-02-06 DIAGNOSIS — M3501 Sicca syndrome with keratoconjunctivitis: Secondary | ICD-10-CM | POA: Diagnosis not present

## 2023-02-06 DIAGNOSIS — M62838 Other muscle spasm: Secondary | ICD-10-CM

## 2023-02-06 DIAGNOSIS — M8589 Other specified disorders of bone density and structure, multiple sites: Secondary | ICD-10-CM

## 2023-02-06 DIAGNOSIS — Z79899 Other long term (current) drug therapy: Secondary | ICD-10-CM | POA: Diagnosis not present

## 2023-02-06 DIAGNOSIS — G2581 Restless legs syndrome: Secondary | ICD-10-CM

## 2023-02-06 DIAGNOSIS — M19071 Primary osteoarthritis, right ankle and foot: Secondary | ICD-10-CM | POA: Diagnosis not present

## 2023-02-06 DIAGNOSIS — F411 Generalized anxiety disorder: Secondary | ICD-10-CM

## 2023-02-06 DIAGNOSIS — Z8639 Personal history of other endocrine, nutritional and metabolic disease: Secondary | ICD-10-CM

## 2023-02-06 DIAGNOSIS — M19072 Primary osteoarthritis, left ankle and foot: Secondary | ICD-10-CM

## 2023-02-06 DIAGNOSIS — G43009 Migraine without aura, not intractable, without status migrainosus: Secondary | ICD-10-CM

## 2023-02-06 DIAGNOSIS — L500 Allergic urticaria: Secondary | ICD-10-CM

## 2023-02-06 DIAGNOSIS — L219 Seborrheic dermatitis, unspecified: Secondary | ICD-10-CM

## 2023-02-06 DIAGNOSIS — M19042 Primary osteoarthritis, left hand: Secondary | ICD-10-CM

## 2023-02-06 DIAGNOSIS — R5383 Other fatigue: Secondary | ICD-10-CM

## 2023-02-06 DIAGNOSIS — Z840 Family history of diseases of the skin and subcutaneous tissue: Secondary | ICD-10-CM

## 2023-02-06 DIAGNOSIS — M19041 Primary osteoarthritis, right hand: Secondary | ICD-10-CM

## 2023-02-06 DIAGNOSIS — H40052 Ocular hypertension, left eye: Secondary | ICD-10-CM

## 2023-02-06 DIAGNOSIS — I10 Essential (primary) hypertension: Secondary | ICD-10-CM

## 2023-02-06 DIAGNOSIS — E538 Deficiency of other specified B group vitamins: Secondary | ICD-10-CM

## 2023-02-06 DIAGNOSIS — E559 Vitamin D deficiency, unspecified: Secondary | ICD-10-CM

## 2023-02-15 ENCOUNTER — Other Ambulatory Visit: Payer: Self-pay | Admitting: Rheumatology

## 2023-03-16 ENCOUNTER — Other Ambulatory Visit: Payer: Self-pay | Admitting: Physician Assistant

## 2023-03-16 DIAGNOSIS — E538 Deficiency of other specified B group vitamins: Secondary | ICD-10-CM

## 2023-03-29 ENCOUNTER — Other Ambulatory Visit: Payer: Self-pay | Admitting: Rheumatology

## 2023-03-29 NOTE — Telephone Encounter (Signed)
Last Fill: 12/18/2022  Eye exam: 06/13/2022 Normal   Labs: 01/30/2023 Creat. 1.02, BUN/Creat. Ratio 7, MCV 105, MCH 36.2  Next Visit: 07/10/2023  Last Visit: 02/06/2023  DX: Sjogren's syndrome with keratoconjunctivitis sicca   Current Dose per office note 02/06/2023: Plaquenil 200 mg 1 tablet by mouth twice daily Monday through Friday.   Okay to refill Plaquenil?

## 2023-04-03 ENCOUNTER — Ambulatory Visit: Payer: BC Managed Care – PPO | Admitting: Physician Assistant

## 2023-04-03 ENCOUNTER — Encounter: Payer: Self-pay | Admitting: Physician Assistant

## 2023-04-03 VITALS — BP 142/74 | HR 95 | Resp 12 | Ht 64.0 in | Wt 165.7 lb

## 2023-04-03 DIAGNOSIS — L219 Seborrheic dermatitis, unspecified: Secondary | ICD-10-CM

## 2023-04-03 MED ORDER — NYSTATIN-TRIAMCINOLONE 100000-0.1 UNIT/GM-% EX OINT: 1.0000 | TOPICAL_OINTMENT | Freq: Two times a day (BID) | CUTANEOUS | 1 refills | Status: AC

## 2023-04-03 NOTE — Progress Notes (Signed)
 Acute Office Visit  Subjective:     Patient ID: Heidi Carney, female    DOB: 01-20-1966, 57 y.o.   MRN: [id number]  Chief Complaint  Patient presents with   Ear Drainage    HPI Patient is in today for left ear drainage, itching, scales off and on for weeks but worsening. Mupirocin has been tried and not really helped. No fever, chills, sinus pressure, ST. Most of the time feels dry and itchy but at times yellow drainage has come out.   .. Active Ambulatory Problems    Diagnosis Date Noted   GAD (generalized anxiety disorder) 01/26/2015   Elevated blood pressure reading 01/26/2015   Thyroid  activity decreased 01/26/2015   Seborrheic dermatitis 03/10/2015   Overweight 03/17/2015   Perimenopausal symptoms 09/13/2015   Hair loss 11/16/2015   RLS (restless legs syndrome) 11/16/2015   B12 deficiency 11/16/2015   Vitamin D  deficiency 11/16/2015   Migraine headache without aura 07/10/2016   Essential hypertension 01/09/2017   Left lower quadrant pain 01/24/2017   Slow transit constipation 01/24/2017   Right ovarian cyst 01/24/2017   Change in stool 02/12/2017   Nausea 07/07/2017   Intractable cyclical vomiting with nausea 07/07/2017   Diarrhea 07/07/2017   Abdominal cramping 07/07/2017   Allergic reaction 07/07/2017   Multiple food allergies 07/12/2017   Bouchard's nodes 12/17/2017   Joint stiffness 12/17/2017   Primary osteoarthritis of both hands 12/17/2017   Surgical 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 replacement therapy 12/17/2017   Colon polyp 12/18/2017   Grade I internal hemorrhoids 12/18/2017   Increased pressure in the eye, left 01/02/2018   Allergic urticaria 02/06/2018   Angioedema 02/06/2018   Food intolerance 02/06/2018   Chronic rhinitis 02/06/2018   Family history of psoriatic arthritis 04/08/2018   Family history of morphea 04/08/2018   Sjogren's syndrome with keratoconjunctivitis sicca (HCC) 04/30/2018   Haglund's deformity of left heel 02/06/2020   Stress  02/06/2020   Itching 02/06/2020   Adverse reaction to food, subsequent encounter 03/30/2020   Sulfite allergy 05/21/2020   Ovarian mass 05/31/2017   Myopia with presbyopia of both eyes 05/17/2018   Grade II hemorrhoids 04/05/2017   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      Objective:    BP (!) 142/74 (BP Location: Left Arm, Patient Position: Sitting)   Pulse 95   Resp 12   Ht 5' 4 (1.626 m)   Wt 165 lb 11.2 oz (75.2 kg)   LMP 01/21/2015   SpO2 99%   BMI 28.44 kg/m  BP Readings from Last 3 Encounters:  04/03/23 (!) 142/74  02/06/23 127/79  01/30/23 (!) 142/71   Wt Readings from Last 3 Encounters:  04/03/23 165 lb 11.2 oz (75.2 kg)  02/06/23 165 lb (74.8 kg)  01/30/23 166 lb (75.3 kg)      Physical Exam Constitutional:      Appearance: Normal appearance.  HENT:     Head: Normocephalic.     Right Ear: Tympanic membrane, ear canal and external ear normal. There is no impacted cerumen.  Left Ear: There is no impacted cerumen.     Ears:     Comments: Scaly erythematous left external ear canal TM not easily visualized Cardiovascular:     Rate and Rhythm: Normal rate.  Pulmonary:     Effort: Pulmonary effort is normal.  Neurological:     General: No focal deficit present.     Mental Status: She is alert.  Psychiatric:        Mood and Affect: Mood normal.           Assessment & Plan:  Heidi Carney was seen today for ear drainage.  Diagnoses and all orders for this visit:  Seborrheic dermatitis -     nystatin -triamcinolone  ointment (MYCOLOG); Apply 1  Application topically 2 (two) times daily.   Use mycolog in ear bid for next 2 weeks then prn      Lebert Lovern, PA-C

## 2023-04-03 NOTE — Patient Instructions (Signed)
Seborrheic Dermatitis, Adult Seborrheic dermatitis is a skin disease that causes red, scaly patches. It often occurs on the scalp, where it may be called dandruff. The patches may also appear on other parts of the body. Skin patches tend to occur where there are a lot of oil glands in the skin. Areas of the body that may be affected include: The scalp. The face, eyebrows, and ears. The area around a beard. Skin folds of the body. This includes the armpits, groin, and buttocks. The chest. The condition is often long-lasting (chronic). It may come and go for no known reason. It may be activated by a trigger, such as: Cold weather. Being out in the sun. Stress. Drinking alcohol. What are the causes? The cause of this condition is not known. It may be related to having too much yeast on the skin or changes in how your body's disease-fighting system (immune system) works. What increases the risk? You may be more likely to develop this condition if: You have a weak immune system. You are 55 years old or older. You have other conditions, such as: Human immunodeficiency virus (HIV) or acquired immunodeficiency virus (AIDS). Parkinson's disease. Mood disorders, such as depression. Liver problems. Obesity. What are the signs or symptoms? Symptoms of this condition include: Thick scales on the scalp. Redness on the face or in the armpits. Skin that is flaky. The flakes may be white or yellow. Skin that seems oily or dry but is not helped with moisturizers. Itching or burning in the affected areas. How is this diagnosed? This condition is diagnosed with a medical history and physical exam. A sample of your skin may be tested (skin biopsy). You may need to see a skin specialist (dermatologist). How is this treated? There is no cure for this condition, but treatment can help to manage the symptoms. You may get treatment to remove scales, lower the risk of skin infection, and reduce swelling or  itching. Treatment may include: Medicated shampoos, moisturizing creams, or ointments. Creams that reduce skin yeast. Creams that reduce swelling and irritation (steroids). Follow these instructions at home: Skin care Use any medicated shampoo, skin creams, or ointments only as told by your health care provider. Do not use skin products that contain alcohol. Take lukewarm baths or showers. Avoid very hot water. When you are outside, wear a hat and clothes that block UV light. General instructions Apply over-the-counter and prescription medicines only as told by your health care provider. Learn what triggers your symptoms so you can avoid these things. Use techniques for stress reduction, such as meditation or yoga. Do not drink alcohol if your health care provider tells you not to drink. Keep all follow-up visits. Your health care provider will check your skin to make sure the treatments are helping. Where to find more information American Academy of Dermatology: [employer] Contact a health care provider if: Your symptoms do not get better with treatment. Your symptoms get worse. You have new symptoms. Get help right away if: Your condition quickly gets worse, even with treatment. This information is not intended to replace advice given to you by your health care provider. Make sure you discuss any questions you have with your health care provider. Document Revised: 08/19/2021 Document Reviewed: 08/19/2021 Elsevier Patient Education  2024 [employer].

## 2023-04-23 ENCOUNTER — Encounter: Payer: Self-pay | Admitting: Physician Assistant

## 2023-04-24 ENCOUNTER — Encounter: Payer: Self-pay | Admitting: Physician Assistant

## 2023-04-24 ENCOUNTER — Ambulatory Visit (INDEPENDENT_AMBULATORY_CARE_PROVIDER_SITE_OTHER): Payer: Self-pay | Admitting: Physician Assistant

## 2023-04-24 VITALS — BP 143/72 | HR 101 | Temp 98.0°F | Ht 64.0 in

## 2023-04-24 DIAGNOSIS — H9011 Conductive hearing loss, unilateral, right ear, with unrestricted hearing on the contralateral side: Secondary | ICD-10-CM | POA: Insufficient documentation

## 2023-04-24 DIAGNOSIS — H6993 Unspecified Eustachian tube disorder, bilateral: Secondary | ICD-10-CM | POA: Diagnosis not present

## 2023-04-24 DIAGNOSIS — H6593 Unspecified nonsuppurative otitis media, bilateral: Secondary | ICD-10-CM | POA: Diagnosis not present

## 2023-04-24 MED ORDER — METHYLPREDNISOLONE 4 MG PO TBPK: ORAL_TABLET | ORAL | 0 refills | Status: DC

## 2023-04-24 MED ORDER — FLUTICASONE PROPIONATE 50 MCG/ACT NA SUSP: 2.0000 | Freq: Every day | NASAL | 0 refills | Status: DC

## 2023-04-24 NOTE — Progress Notes (Signed)
Acute Office Visit  Subjective:     Patient ID: Heidi Carney, female    DOB: 01/07/1966, 58 y.o.   MRN: [id number]  Chief Complaint  Patient presents with   Ear Pain    Left ear pain    HPI  Patient is in today for ongoing left ear pain. She was last seen in the office on 04/03/2023 for left ear drainage, itching and scales that were on and off for a few weeks. She is still complaining of some residual fullness in her left ear after using Mycolog. She also feels as if the inner canal of her ear is still slightly dry which is also making it itchy. She now denies having drainage from the left ear, loss of hearing or tinnitus. She is reporting minimal itching and fullness in her right ear and denies loss of hearing or tinnitus.  Review of Systems  HENT:  Negative for ear discharge, hearing loss and tinnitus.   All other systems reviewed and are negative.   Active Ambulatory Problems    Diagnosis Date Noted   GAD (generalized anxiety disorder) 01/26/2015   Elevated blood pressure reading 01/26/2015   Thyroid activity decreased 01/26/2015   Seborrheic dermatitis 03/10/2015   Overweight 03/17/2015   Perimenopausal symptoms 09/13/2015   Hair loss 11/16/2015   RLS (restless legs syndrome) 11/16/2015   B12 deficiency 11/16/2015   Vitamin D deficiency 11/16/2015   Migraine headache without aura 07/10/2016   Essential hypertension 01/09/2017   Left lower quadrant pain 01/24/2017   Slow transit constipation 01/24/2017   Right ovarian cyst 01/24/2017   Change in stool 02/12/2017   Nausea 07/07/2017   Intractable cyclical vomiting with nausea 07/07/2017   Diarrhea 07/07/2017   Abdominal cramping 07/07/2017   Allergic reaction 07/07/2017   Multiple food allergies 07/12/2017   Bouchard's nodes 12/17/2017   Joint stiffness 12/17/2017   Primary osteoarthritis of both hands 12/17/2017   Surgical 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 replacement therapy 12/17/2017   Colon polyp 12/18/2017   Grade  I internal hemorrhoids 12/18/2017   Increased pressure in the eye, left 01/02/2018   Allergic urticaria 02/06/2018   Angioedema 02/06/2018   Food intolerance 02/06/2018   Chronic rhinitis 02/06/2018   Family history of psoriatic arthritis 04/08/2018   Family history of morphea 04/08/2018   Sjogren's syndrome with keratoconjunctivitis sicca (HCC) 04/30/2018   Haglund's deformity of left heel 02/06/2020   Stress 02/06/2020   Itching 02/06/2020   Adverse reaction to food, subsequent encounter 03/30/2020   Sulfite allergy 05/21/2020   Ovarian mass 05/31/2017   Myopia with presbyopia of both eyes 05/17/2018   Grade II hemorrhoids 04/05/2017   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   Osteopenia 08/03/2021   Muscle cramp 07/31/2022   Vaginal dryness 07/31/2022   Dysfunction of both eustachian tubes 04/24/2023   Middle ear effusion, bilateral 04/24/2023   Conductive hearing loss of right ear with unrestricted hearing of left ear 04/24/2023   Resolved Ambulatory Problems    Diagnosis Date Noted   Atypical chest pain 01/26/2015   Benign paroxysmal positional vertigo 01/09/2017   Past Medical History:  Diagnosis Date   Anxiety    Arthritis    H/O total vaginal hysterectomy 05/2017   Hypothyroidism    Sjogren's disease (HCC)        Objective:    BP (!) 143/72   Pulse (!) 101   Temp 98 F (36.7 C) (Oral)   Ht 5\' 4"  (1.626 m)   LMP 01/21/2015  SpO2 99%   BMI 28.44 kg/m   BP Readings from Last 3 Encounters:  04/24/23 (!) 143/72  04/03/23 (!) 142/74  02/06/23 127/79   Wt Readings from Last 3 Encounters:  04/03/23 165 lb 11.2 oz (75.2 kg)  02/06/23 165 lb (74.8 kg)  01/30/23 166 lb (75.3 kg)    Physical Exam Constitutional:      Appearance: Normal appearance.  HENT:     Head: Normocephalic.     Right Ear: Tympanic membrane, ear canal and external ear normal.     Left Ear: Tympanic membrane, ear canal and external ear normal.     Ears:     Comments: Middle ear  effusion bilaterally Cardiovascular:     Rate and Rhythm: Normal rate and regular rhythm.     Pulses: Normal pulses.     Heart sounds: Normal heart sounds.  Pulmonary:     Effort: Pulmonary effort is normal.     Breath sounds: Normal breath sounds.  Skin:    General: Skin is warm and dry.  Neurological:     General: No focal deficit present.     Mental Status: She is alert and oriented to person, place, and time.  Psychiatric:        Mood and Affect: Mood normal.        Behavior: Behavior normal.     Hearing Screening   500Hz  1000Hz  2000Hz  4000Hz   Right ear Fail Pass Fail Pass  Left ear Pass Pass Pass Pass       Assessment & Plan:   Heidi Carney" was seen today for ear pain.  Diagnoses and all orders for this visit:  Middle ear effusion, bilateral -     methylPREDNISolone (MEDROL DOSEPAK) 4 MG TBPK tablet; Take as directed by package insert. -     fluticasone (FLONASE) 50 MCG/ACT nasal spray; Place 2 sprays into both nostrils daily.  Dysfunction of both eustachian tubes -     methylPREDNISolone (MEDROL DOSEPAK) 4 MG TBPK tablet; Take as directed by package insert. -     fluticasone (FLONASE) 50 MCG/ACT nasal spray; Place 2 sprays into both nostrils daily.  Conductive hearing loss of right ear with unrestricted hearing of left ear    Middle ear effusion noted bilaterally Audiogram conducted in office showing conductive hearing loss of the right ear and unrestricted hearing of the left ear Bilateral gross hearing intact  Prednisone sent to the pharmacy Use prednisone and Flonase daily for ETD causing middle ear effusion  Follow up in office for repeat audiogram after completing prednisone taper   Tandy Gaw, PA-C

## 2023-04-24 NOTE — Patient Instructions (Signed)
Eustachian Tube Dysfunction  Eustachian tube dysfunction refers to a condition in which a blockage develops in the narrow passage that connects the middle ear to the back of the nose (eustachian tube). The eustachian tube regulates air pressure in the middle ear by letting air move between the ear and nose. It also helps to drain fluid from the middle ear space. Eustachian tube dysfunction can affect one or both ears. When the eustachian tube does not function properly, air pressure, fluid, or both can build up in the middle ear. What are the causes? This condition occurs when the eustachian tube becomes blocked or cannot open normally. Common causes of this condition include: Ear infections. Colds and other infections that affect the nose, mouth, and throat (upper respiratory tract). Allergies. Irritation from cigarette smoke. Irritation from stomach acid coming up into the esophagus (gastroesophageal reflux). The esophagus is the part of the body that moves food from the mouth to the stomach. Sudden changes in air pressure, such as from descending in an airplane or scuba diving. Abnormal growths in the nose or throat, such as: Growths that line the nose (nasal polyps). Abnormal growth of cells (tumors). Enlarged tissue at the back of the throat (adenoids). What increases the risk? You are more likely to develop this condition if: You smoke. You are overweight. You are a child who has: Certain birth defects of the mouth, such as cleft palate. Large tonsils or adenoids. What are the signs or symptoms? Common symptoms of this condition include: A feeling of fullness in the ear. Ear pain. Clicking or popping noises in the ear. Ringing in the ear (tinnitus). Hearing loss. Loss of balance. Dizziness. Symptoms may get worse when the air pressure around you changes, such as when you travel to an area of high elevation, fly on an airplane, or go scuba diving. How is this diagnosed? This  condition may be diagnosed based on: Your symptoms. A physical exam of your ears, nose, and throat. Tests, such as those that measure: The movement of your eardrum. Your hearing (audiometry). How is this treated? Treatment depends on the cause and severity of your condition. In mild cases, you may relieve your symptoms by moving air into your ears. This is called "popping the ears." In more severe cases, or if you have symptoms of fluid in your ears, treatment may include: Medicines to relieve congestion (decongestants). Medicines that treat allergies (antihistamines). Nasal sprays or ear drops that contain medicines that reduce swelling (steroids). A procedure to drain the fluid in your eardrum. In this procedure, a small tube may be placed in the eardrum to: Drain the fluid. Restore the air in the middle ear space. A procedure to insert a balloon device through the nose to inflate the opening of the eustachian tube (balloon dilation). Follow these instructions at home: Lifestyle Do not do any of the following until your health care provider approves: Travel to high altitudes. Fly in airplanes. Work in a pressurized cabin or room. Scuba dive. Do not use any products that contain nicotine or tobacco. These products include cigarettes, chewing tobacco, and vaping devices, such as e-cigarettes. If you need help quitting, ask your health care provider. Keep your ears dry. Wear fitted earplugs during showering and bathing. Dry your ears completely after. General instructions Take over-the-counter and prescription medicines only as told by your health care provider. Use techniques to help pop your ears as recommended by your health care provider. These may include: Chewing gum. Yawning. Frequent, forceful swallowing.   Closing your mouth, holding your nose closed, and gently blowing as if you are trying to blow air out of your nose. Keep all follow-up visits. This is important. Contact a  health care provider if: Your symptoms do not go away after treatment. Your symptoms come back after treatment. You are unable to pop your ears. You have: A fever. Pain in your ear. Pain in your head or neck. Fluid draining from your ear. Your hearing suddenly changes. You become very dizzy. You lose your balance. Get help right away if: You have a sudden, severe increase in any of your symptoms. Summary Eustachian tube dysfunction refers to a condition in which a blockage develops in the eustachian tube. It can be caused by ear infections, allergies, inhaled irritants, or abnormal growths in the nose or throat. Symptoms may include ear pain or fullness, hearing loss, or ringing in the ears. Mild cases are treated with techniques to unblock the ears, such as yawning or chewing gum. More severe cases are treated with medicines or procedures. This information is not intended to replace advice given to you by your health care provider. Make sure you discuss any questions you have with your health care provider. Document Revised: 05/31/2020 Document Reviewed: 05/31/2020 Elsevier Patient Education  2024 [employer] Inc[employer]  

## 2023-05-14 ENCOUNTER — Encounter: Payer: Self-pay | Admitting: Physician Assistant

## 2023-05-14 ENCOUNTER — Ambulatory Visit: Payer: 59 | Admitting: Physician Assistant

## 2023-05-14 VITALS — BP 143/64 | HR 98 | Ht 64.0 in | Wt 167.0 lb

## 2023-05-14 DIAGNOSIS — H900 Conductive hearing loss, bilateral: Secondary | ICD-10-CM | POA: Diagnosis not present

## 2023-05-14 DIAGNOSIS — H6993 Unspecified Eustachian tube disorder, bilateral: Secondary | ICD-10-CM

## 2023-05-14 DIAGNOSIS — H6593 Unspecified nonsuppurative otitis media, bilateral: Secondary | ICD-10-CM | POA: Diagnosis not present

## 2023-05-14 NOTE — Progress Notes (Signed)
 Heidi Carney

## 2023-05-14 NOTE — Progress Notes (Signed)
 Established Patient Office Visit  Subjective   Patient ID: Heidi Carney, female    DOB: 08-23-1965  Age: 58 y.o. MRN: [id number]  No chief complaint on file.   HPI Pt is a 58 yo female who presents to the clinic to follow up on left ear fullness and pressure. Her symptoms got better with prednisone and flonase  but she has since stopped both and symptoms have come back. She denies any noticeable hearing loss even though right ear showed hearing loss at last visit.   .. Active Ambulatory Problems    Diagnosis Date Noted   GAD (generalized anxiety disorder) 01/26/2015   Elevated blood pressure reading 01/26/2015   Thyroid  activity decreased 01/26/2015   Seborrheic dermatitis 03/10/2015   Overweight 03/17/2015   Perimenopausal symptoms 09/13/2015   Hair loss 11/16/2015   RLS (restless legs syndrome) 11/16/2015   B12 deficiency 11/16/2015   Vitamin D  deficiency 11/16/2015   Migraine headache without aura 07/10/2016   Essential hypertension 01/09/2017   Left lower quadrant pain 01/24/2017   Slow transit constipation 01/24/2017   Right ovarian cyst 01/24/2017   Change in stool 02/12/2017   Nausea 07/07/2017   Intractable cyclical vomiting with nausea 07/07/2017   Diarrhea 07/07/2017   Abdominal cramping 07/07/2017   Allergic reaction 07/07/2017   Multiple food allergies 07/12/2017   Bouchard's nodes 12/17/2017   Joint stiffness 12/17/2017   Primary osteoarthritis of both hands 12/17/2017   Surgical 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 replacement therapy 12/17/2017   Colon polyp 12/18/2017   Grade I internal hemorrhoids 12/18/2017   Increased pressure in the eye, left 01/02/2018   Allergic urticaria 02/06/2018   Angioedema 02/06/2018   Food intolerance 02/06/2018   Chronic rhinitis 02/06/2018   Family history of psoriatic arthritis 04/08/2018   Family history of morphea 04/08/2018   Sjogren's syndrome with keratoconjunctivitis sicca (HCC) 04/30/2018   Haglund's deformity of left heel  02/06/2020   Stress 02/06/2020   Itching 02/06/2020   Adverse reaction to food, subsequent encounter 03/30/2020   Sulfite allergy 05/21/2020   Ovarian mass 05/31/2017   Myopia with presbyopia of both eyes 05/17/2018   Grade II hemorrhoids 04/05/2017   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    Objective:     LMP 01/21/2015  BP Readings from Last 3 Encounters:  05/14/23 (!) 143/64  04/24/23 (!) 143/72  04/03/23 (!) 142/74   Wt Readings from Last 3 Encounters:  05/14/23 167 lb (75.8 kg)  04/03/23 165 lb 11.2 oz (75.2 kg)  02/06/23 165 lb (74.8 kg)    .Heidi Carney Hearing Screening   500Hz  1000Hz  2000Hz  4000Hz   Right ear Fail Pass Pass Pass  Left ear Fail Pass Pass Pass      Physical Exam Constitutional:      Appearance: Normal appearance.  HENT:     Head: Normocephalic.     Right Ear: Ear canal and  external ear normal. There is no impacted cerumen.     Left Ear: Ear canal and external ear normal. There is no impacted cerumen.     Ears:     Comments: Bilateral middle ear effusion with light reflex intact  Some erythema around the ossicles without bulging of TM Cardiovascular:     Rate and Rhythm: Normal rate.  Pulmonary:     Effort: Pulmonary effort is normal.  Neurological:     General: No focal deficit present.     Mental Status: She is alert.  Psychiatric:        Mood and Affect: Mood normal.        The 10-year ASCVD risk score (Arnett DK, et al., 2019) is: 2.3%    Assessment & Plan:  Heidi AasAaron AasDina Carney "Heidi Carney" was seen today for medical management of chronic issues.  Diagnoses and all orders for this visit:  Dysfunction of both eustachian tubes -     Ambulatory referral to ENT  Middle ear effusion, bilateral -     Ambulatory referral to ENT  Conductive hearing loss, bilateral -     Ambulatory referral to ENT   Pt continues to have problems with left ear pressure and fullness Continue to see evidence of effusion in middle ear bilaterally Pt did get better on prednisone Will make ENT referral Start flonase  twice a day and sudafed only if needed Hearing was effected at low frequency consider follow up on hearing with ENT     Sandy Crumb, PA-C

## 2023-05-21 ENCOUNTER — Encounter: Payer: Self-pay | Admitting: Physician Assistant

## 2023-05-22 ENCOUNTER — Other Ambulatory Visit: Payer: Self-pay | Admitting: Physician Assistant

## 2023-05-22 DIAGNOSIS — H6993 Unspecified Eustachian tube disorder, bilateral: Secondary | ICD-10-CM

## 2023-05-22 DIAGNOSIS — H6593 Unspecified nonsuppurative otitis media, bilateral: Secondary | ICD-10-CM

## 2023-05-26 ENCOUNTER — Other Ambulatory Visit: Payer: Self-pay | Admitting: Physician Assistant

## 2023-05-28 NOTE — Telephone Encounter (Signed)
 Last Fill: 03/29/2023  Eye exam: 06/13/2022   Labs: 01/30/2023 Creatinine was elevated at 1.03--avoid the use of NSAIDs and increase water intake.  Rest of CMP within normal limits. MCV and MCH are significantly elevated.  Vitamin B12 1,002 and folate >20.0  Creatinine ratio within normal limits.  Double-stranded and a negative, complements within normal limits, sed rate 4-WNL. ANA negative DsDNA negative SPEP normal  Next Visit: 07/10/2023  Last Visit: 02/06/2023  [id number] syndrome with keratoconjunctivitis sicca   Current Dose per office note on 02/06/2023: Plaquenil 200 mg 1 tablet by mouth twice daily Monday through Friday.   Okay to refill Plaquenil?

## 2023-06-26 ENCOUNTER — Encounter: Payer: Self-pay | Admitting: Physician Assistant

## 2023-06-26 DIAGNOSIS — E039 Hypothyroidism, unspecified: Secondary | ICD-10-CM

## 2023-06-27 ENCOUNTER — Other Ambulatory Visit: Payer: Self-pay | Admitting: Physician Assistant

## 2023-06-27 DIAGNOSIS — H6593 Unspecified nonsuppurative otitis media, bilateral: Secondary | ICD-10-CM

## 2023-06-27 DIAGNOSIS — H6993 Unspecified Eustachian tube disorder, bilateral: Secondary | ICD-10-CM

## 2023-06-27 MED ORDER — TIROSINT 88 MCG PO CAPS: ORAL_CAPSULE | ORAL | 1 refills | Status: DC

## 2023-06-27 MED ORDER — TIROSINT 75 MCG PO CAPS: ORAL_CAPSULE | ORAL | 1 refills | Status: DC

## 2023-06-27 NOTE — Telephone Encounter (Signed)
 Requesting rx rf of  Tirosint cap Levothyroxine cap Last written 12/08/2022 Last OV 05/14/2023 Upcoming appt 07/31/2023 Lab - last TSH and T4 01/30/2023

## 2023-06-27 NOTE — Progress Notes (Signed)
 Office Visit Note  Patient: Heidi Carney             Date of Birth: 10-11-1965           MRN: [id number]             PCP: Nolene Ebbs Referring: Jomarie Longs, PA-C Visit Date: 07/10/2023 Occupation: @GUAROCC @  Subjective:  Pain in thigh muscles and weakness   History of Present Illness: Heidi Carney is a 58 y.o. female with Sjogren's and osteoarthritis.  She returns today after her last visit in November 2024.  She continues to have dry mouth dry eye symptoms.  She states the symptoms are manageable with over-the-counter products.  She remains on hydroxychloroquine 200 mg p.o. twice daily Monday to Friday.  She states for the last 6 months she has been having increased discomfort in her bilateral thighs.  She states the symptoms come and go.  She has intermittent discomfort climbing stairs.  She has no difficulty getting up from the squatting position.  She denies shortness of breath or lymphadenopathy.  She continues to have discomfort in her hands or feet.  She has not noticed any inflammation.  She continues to have some trapezius spasm.    Activities of Daily Living:  Patient reports morning stiffness for 0 minute.   Patient Reports nocturnal pain.  Difficulty dressing/grooming: Denies Difficulty climbing stairs: Denies Difficulty getting out of chair: Denies Difficulty using hands for taps, buttons, cutlery, and/or writing: Denies  Review of Systems  Constitutional:  Positive for fatigue.  HENT:  Positive for mouth dryness. Negative for mouth sores.   Eyes:  Positive for dryness.  Respiratory:  Negative for shortness of breath.   Cardiovascular:  Negative for chest pain and palpitations.  Gastrointestinal:  Positive for constipation. Negative for blood in stool and diarrhea.  Endocrine: Negative for increased urination.  Genitourinary:  Negative for involuntary urination.  Musculoskeletal:  Positive for joint pain, joint pain, myalgias, muscle weakness,  muscle tenderness and myalgias. Negative for gait problem, joint swelling and morning stiffness.  Skin:  Positive for sensitivity to sunlight. Negative for color change, rash and hair loss.  Allergic/Immunologic: Negative for susceptible to infections.  Neurological:  Positive for headaches. Negative for dizziness.  Hematological:  Negative for swollen glands.  Psychiatric/Behavioral:  Positive for sleep disturbance. Negative for depressed mood. The patient is not nervous/anxious.     PMFS History:  Patient Active Problem List   Diagnosis Date Noted   Dysfunction of both eustachian tubes 04/24/2023   Middle ear effusion, bilateral 04/24/2023   Conductive hearing loss of right ear with unrestricted hearing of left ear 04/24/2023   Muscle cramp 07/31/2022   Vaginal dryness 07/31/2022   Osteopenia 08/03/2021   Sulfite allergy 05/21/2020   Adverse reaction to food, subsequent encounter 03/30/2020   Haglund's deformity of left heel 02/06/2020   Stress 02/06/2020   Itching 02/06/2020   Myopia with presbyopia of both eyes 05/17/2018   Sjogren's syndrome with keratoconjunctivitis sicca (HCC) 04/30/2018   Family history of psoriatic arthritis 04/08/2018   Family history of morphea 04/08/2018   Allergic urticaria 02/06/2018   Angioedema 02/06/2018   Food intolerance 02/06/2018   Chronic rhinitis 02/06/2018   Increased pressure in the eye, left 01/02/2018   Colon polyp 12/18/2017   Grade I internal hemorrhoids 12/18/2017   Bouchard's nodes 12/17/2017   Joint stiffness 12/17/2017   Primary osteoarthritis of both hands 12/17/2017   Surgical 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 replacement therapy 12/17/2017  Multiple food allergies 07/12/2017   Nausea 07/07/2017   Intractable cyclical vomiting with nausea 07/07/2017   Diarrhea 07/07/2017   Abdominal cramping 07/07/2017   Allergic reaction 07/07/2017   Ovarian mass 05/31/2017   Grade II hemorrhoids 04/05/2017   Change in stool 02/12/2017   Benign  teratoma of ovary 02/06/2017   Left lower quadrant pain 01/24/2017   Slow transit constipation 01/24/2017   Right ovarian cyst 01/24/2017   Essential hypertension 01/09/2017   Migraine headache without aura 07/10/2016   Hair loss 11/16/2015   RLS (restless legs syndrome) 11/16/2015   B12 deficiency 11/16/2015   Vitamin D deficiency 11/16/2015   Perimenopausal symptoms 09/13/2015   Overweight 03/17/2015   Seborrheic dermatitis 03/10/2015   GAD (generalized anxiety disorder) 01/26/2015   Elevated blood pressure reading 01/26/2015   Thyroid activity decreased 01/26/2015    Past Medical History:  Diagnosis Date   Anxiety    Arthritis    H/O total vaginal hysterectomy 05/2017   Hypothyroidism    Sjogren's disease (HCC)     Family History  Problem Relation Age of Onset   Diabetes Father    Cancer Father        prostate CA   Stroke Father    Glaucoma Mother    Asthma Sister    Psoriasis Sister        psoriatic arthritis   Arthritis Sister    Scleroderma Sister    Eczema Sister    Asthma Sister    Healthy Daughter    Angioedema Neg Hx    Immunodeficiency Neg Hx    Urticaria Neg Hx    Past Surgical History:  Procedure Laterality Date   CESAREAN SECTION  2012   EYE SURGERY     LAPAROSCOPIC TOTAL HYSTERECTOMY  05/2017   MOHS SURGERY  03/08/2022   forehead   Social History   Social History Narrative   Not on file   Immunization History  Administered Date(s) Administered   Influenza Inj Mdck Quad Pf 01/08/2019   Influenza, Quadrivalent, Recombinant, Inj, Pf 12/21/2016, 03/15/2018   Influenza,inj,Quad PF,6+ Mos 03/17/2015, 02/02/2016   PFIZER(Purple Top)SARS-COV-2 Vaccination 05/30/2019, 06/27/2019, 01/09/2020   Tdap 07/27/2021     Objective: Vital Signs: BP 133/84 (BP Location: Left Arm, Patient Position: Sitting, Cuff Size: Normal)   Pulse 79   Resp 14   Ht 5\' 4"  (1.626 m)   Wt 169 lb (76.7 kg)   LMP 01/21/2015   BMI 29.01 kg/m    Physical Exam Vitals  and nursing note reviewed.  Constitutional:      Appearance: She is well-developed.  HENT:     Head: Normocephalic and atraumatic.  Eyes:     Conjunctiva/sclera: Conjunctivae normal.  Cardiovascular:     Rate and Rhythm: Normal rate and regular rhythm.     Heart sounds: Normal heart sounds.  Pulmonary:     Effort: Pulmonary effort is normal.     Breath sounds: Normal breath sounds.  Abdominal:     General: Bowel sounds are normal.     Palpations: Abdomen is soft.  Musculoskeletal:     Cervical back: Normal range of motion.  Lymphadenopathy:     Cervical: No cervical adenopathy.  Skin:    General: Skin is warm and dry.     Capillary Refill: Capillary refill takes less than 2 seconds.  Neurological:     Mental Status: She is alert and oriented to person, place, and time.  Psychiatric:        Behavior: Behavior  normal.      Musculoskeletal Exam: Cervical spine was in good range of motion.  She had no tenderness over thoracic or lumbar spine.  She had no difficulty reaching her toes.Shoulders, elbows, wrist joints were in good range of motion.  She had bilateral PIP and DIP thickening with no synovitis.  Hip joints were in good range of motion without any discomfort.  She had no difficulty getting up from the squatting position.  There was no warmth swelling or effusion over her knee joints.  There is no tenderness over ankles or MTPs.  CDAI Exam: CDAI Score: -- Patient Global: --; Provider Global: -- Swollen: --; Tender: -- Joint Exam 07/10/2023   No joint exam has been documented for this visit   There is currently no information documented on the homunculus. Go to the Rheumatology activity and complete the homunculus joint exam.  Investigation: No additional findings.  Imaging: No results found.  Recent Labs: Lab Results  Component Value Date   WBC 5.0 01/30/2023   HGB 14.0 01/30/2023   PLT 179 01/30/2023   NA 138 01/30/2023   K 3.9 01/30/2023   CL 99 01/30/2023    CO2 25 01/30/2023   GLUCOSE 83 01/30/2023   BUN 7 01/30/2023   CREATININE 1.02 (H) 01/30/2023   BILITOT 0.5 01/30/2023   ALKPHOS 95 01/30/2023   AST 26 01/30/2023   ALT 22 01/30/2023   PROT 7.1 01/30/2023   ALBUMIN 4.4 01/30/2023   CALCIUM 9.1 01/30/2023   GFRAA 86 07/06/2020    Speciality Comments: PLQ Eye Exam 06/28/2023 Normal @ [facility] f/u 6 months   Procedures:  No procedures performed Allergies: Shrimp [shellfish allergy], Celexa [citalopram hydrobromide], Elemental sulfur, Sulfur, Macrobid [nitrofurantoin], Sulfites, Synthroid [levothyroxine sodium], Z-pak [azithromycin], and Shrimp extract   Assessment / Plan:     Visit Diagnoses: Sjogren's syndrome with keratoconjunctivitis sicca (HCC) - Sicca symptoms with positive ANA, positive Ro, negative RF: -She continues to have dry mouth and dry eye symptoms.  She states the symptoms are manageable with over-the-counter products.  She continues to be on Plaquenil.  She denies any history of shortness of breath or lymphadenopathy.  Lungs were clear to auscultation.  No lymph nodes were palpable.  I will check autoimmune labs today.  Plan: Protein / creatinine ratio, urine, Anti-DNA antibody, double-stranded, C3 and C4, Sedimentation rate, Sjogrens syndrome-A extractable nuclear antibody, Serum protein electrophoresis with reflex  High risk medication use - Plaquenil 200 mg 1 tablet by mouth twice daily Monday through Friday. PLQ Eye Exam 06/28/2023 - Plan: CBC with Differential/Platelet, Comprehensive metabolic panel with GFR  Myalgia -she has been experiencing pain in her thigh muscles off and on for the last 6 months.  No muscular weakness was noted.  She had no difficulty getting up from the squatting position.  I will check CK today.  Plan: CK.  Will contact her once the lab results are available.  Primary osteoarthritis of both hands-she has severe PIP and DIP thickening bilaterally since suggestive of  osteoarthritis.  Joint protection muscle strengthening was discussed.  A handout on hand exercises was given.  Primary osteoarthritis of both knees-x-rays from October 30, 2018 were reviewed.  Which showed bilateral moderate osteoarthritis and chondromalacia patella.  X-ray findings were reviewed with the patient.  A handout on lower extremity muscle strengthening exercises was given.  Primary osteoarthritis of both feet-proper fitting shoes were advised.  Trapezius muscle spasm-she continues to have some tension around the trapezius region.  Stretching  exercises were discussed.  Other fatigue -she has been experiencing increased fatigue.  Plan: TSH  Osteopenia of multiple sites - DEXA scan from Aug 03, 2021 was reviewed which showed T-score of -2.0, BMD 0.767 in the right femoral neck consistent with osteopenia.  She will need repeat DEXA scan after May 2025.  Use of calcium rich diet and vitamin D was discussed.  Vitamin D insufficiency -I will check vitamin D level today.  Plan: VITAMIN D 25 Hydroxy (Vit-D Deficiency, Fractures)  Essential hypertension-blood pressure was evaded at 144/83.  Repeat blood pressure was 133/84.  She was advised to monitor blood pressure closely.  Other medical problems listed as follows:  Increased pressure in the eye, left  Migraine without aura and without status migrainosus, not intractable  GAD (generalized anxiety disorder)  RLS (restless legs syndrome)  Allergic urticaria  Vitamin B12 deficiency  History of hypothyroidism  Family history of morphea  Family history of psoriatic arthritis  Seborrheic dermatitis  Orders: Orders Placed This Encounter  Procedures   Protein / creatinine ratio, urine   CBC with Differential/Platelet   Comprehensive metabolic panel with GFR   Anti-DNA antibody, double-stranded   C3 and C4   Sedimentation rate   Sjogrens syndrome-A extractable nuclear antibody   Serum protein electrophoresis with reflex    VITAMIN D 25 Hydroxy (Vit-D Deficiency, Fractures)   CK   TSH   No orders of the defined types were placed in this encounter.   Follow-Up Instructions: Return in about 5 months (around 12/10/2023) for Sjogren's, Osteoarthritis.   Pollyann Savoy, MD  Note - This record has been created using Animal nutritionist.  Chart creation errors have been sought, but may not always  have been located. Such creation errors do not reflect on  the standard of medical care.

## 2023-07-10 ENCOUNTER — Ambulatory Visit: Payer: BC Managed Care – PPO | Attending: Rheumatology | Admitting: Rheumatology

## 2023-07-10 ENCOUNTER — Encounter: Payer: Self-pay | Admitting: Rheumatology

## 2023-07-10 VITALS — BP 133/84 | HR 79 | Resp 14 | Ht 64.0 in | Wt 169.0 lb

## 2023-07-10 DIAGNOSIS — I10 Essential (primary) hypertension: Secondary | ICD-10-CM

## 2023-07-10 DIAGNOSIS — M8589 Other specified disorders of bone density and structure, multiple sites: Secondary | ICD-10-CM

## 2023-07-10 DIAGNOSIS — R5383 Other fatigue: Secondary | ICD-10-CM

## 2023-07-10 DIAGNOSIS — Z79899 Other long term (current) drug therapy: Secondary | ICD-10-CM | POA: Diagnosis not present

## 2023-07-10 DIAGNOSIS — F411 Generalized anxiety disorder: Secondary | ICD-10-CM

## 2023-07-10 DIAGNOSIS — G2581 Restless legs syndrome: Secondary | ICD-10-CM

## 2023-07-10 DIAGNOSIS — G43009 Migraine without aura, not intractable, without status migrainosus: Secondary | ICD-10-CM

## 2023-07-10 DIAGNOSIS — M62838 Other muscle spasm: Secondary | ICD-10-CM

## 2023-07-10 DIAGNOSIS — M3501 Sicca syndrome with keratoconjunctivitis: Secondary | ICD-10-CM

## 2023-07-10 DIAGNOSIS — M19072 Primary osteoarthritis, left ankle and foot: Secondary | ICD-10-CM

## 2023-07-10 DIAGNOSIS — M19071 Primary osteoarthritis, right ankle and foot: Secondary | ICD-10-CM

## 2023-07-10 DIAGNOSIS — Z840 Family history of diseases of the skin and subcutaneous tissue: Secondary | ICD-10-CM

## 2023-07-10 DIAGNOSIS — L219 Seborrheic dermatitis, unspecified: Secondary | ICD-10-CM

## 2023-07-10 DIAGNOSIS — M791 Myalgia, unspecified site: Secondary | ICD-10-CM

## 2023-07-10 DIAGNOSIS — M170 Bilateral primary osteoarthritis of knee: Secondary | ICD-10-CM

## 2023-07-10 DIAGNOSIS — M19041 Primary osteoarthritis, right hand: Secondary | ICD-10-CM | POA: Diagnosis not present

## 2023-07-10 DIAGNOSIS — L500 Allergic urticaria: Secondary | ICD-10-CM

## 2023-07-10 DIAGNOSIS — E559 Vitamin D deficiency, unspecified: Secondary | ICD-10-CM

## 2023-07-10 DIAGNOSIS — E538 Deficiency of other specified B group vitamins: Secondary | ICD-10-CM

## 2023-07-10 DIAGNOSIS — H40052 Ocular hypertension, left eye: Secondary | ICD-10-CM

## 2023-07-10 DIAGNOSIS — M19042 Primary osteoarthritis, left hand: Secondary | ICD-10-CM

## 2023-07-10 DIAGNOSIS — Z8639 Personal history of other endocrine, nutritional and metabolic disease: Secondary | ICD-10-CM

## 2023-07-10 NOTE — Patient Instructions (Signed)
 Exercises for Chronic Knee Pain Chronic knee pain is pain that lasts longer than 3 months. For most people with chronic knee pain, exercise and weight loss is an important part of treatment. Your health care provider may want you to focus on: Making the muscles that support your knee stronger. This can take pressure off your knee and reduce pain. Preventing knee stiffness. How far you can move your knee, keeping it there or making it farther. Losing weight (if this applies) to take pressure off your knee, lower your risk for injury, and make it easier for you to exercise. Your provider will help you make an exercise program that fits your needs and physical abilities. Below are simple, low-impact exercises you can do at home. Ask your provider or physical therapist how often you should do your exercise program and how many times to repeat each exercise. General safety tips  Get your provider's approval before doing any exercises. Start slowly and stop any time you feel pain. Do not exercise if your knee pain is flaring up. Warm up first. Stretching a cold muscle can cause an injury. Do 5-10 minutes of easy movement or light stretching before beginning your exercises. Do 5-10 minutes of low-impact activity (like walking or cycling) before starting strengthening exercises. Contact your provider any time you have pain during or after exercising. Exercise can cause discomfort but should not be painful. It is normal to be a little stiff or sore after exercising. Stretching and range-of-motion exercises Front thigh stretch  Stand up straight and support your body by holding on to a chair or resting one hand on a wall. With your legs straight and close together, bend one knee to lift your heel up toward your butt. Using one hand for support, grab your ankle with your free hand. Pull your foot up closer toward your butt to feel the stretch in front of your thigh. Hold the stretch for 30  seconds. Repeat __________ times. Complete this exercise __________ times a day. Back thigh stretch  Sit on the floor with your back straight and your legs out straight in front of you. Place the palms of your hands on the floor and slide them toward your feet as you bend at the hip. Try to touch your nose to your knees and feel the stretch in the back of your thighs. Hold for 30 seconds. Repeat __________ times. Complete this exercise __________ times a day. Calf stretch  Stand facing a wall. Place the palms of your hands flat against the wall, arms extended, and lean slightly against the wall. Get into a lunge position with one leg bent at the knee and the other leg stretched out straight behind you. Keep both feet facing the wall and increase the bend in your knee while keeping the heel of the other leg flat on the ground. You should feel the stretch in your calf. Hold for 30 seconds. Repeat __________ times. Complete this exercise __________ times a day. Strengthening exercises Straight leg lift  Lie on your back with one knee bent and the other leg out straight. Slowly lift the straight leg without bending the knee. Lift until your foot is about 12 inches (30 cm) off the floor. Hold for 3-5 seconds and slowly lower your leg. Repeat __________ times. Complete this exercise __________ times a day. Single leg dip  Stand between two chairs and put both hands on the backs of the chairs for support. Extend one leg out straight with your body  weight resting on the heel of the standing leg. Slowly bend your standing knee to dip your body to the level that is comfortable for you. Hold for 3-5 seconds. Repeat __________ times. Complete this exercise __________ times a day. Hamstring curls  Stand straight, knees close together, facing the back of a chair. Hold on to the back of a chair with both hands. Keep one leg straight. Bend the other knee while bringing the heel up toward the butt  until the knee is bent at a 90-degree angle (right angle). Hold for 3-5 seconds. Repeat __________ times. Complete this exercise __________ times a day. Wall squat  Stand straight with your back, hips, and head against a wall. Step forward one foot at a time with your back still against the wall. Your feet should be 2 feet (61 cm) from the wall at shoulder width. Keeping your back, hips, and head against the wall, slide down the wall to as close to a sitting position as you can get. Hold for 5-10 seconds, then slowly slide back up. Repeat __________ times. Complete this exercise __________ times a day. Step-ups  Stand in front of a sturdy platform or stool that is about 6 inches (15 cm) high. Slowly step up with your left / right foot, keeping your knee in line with your hip and foot. Do not let your knee bend so far that you cannot see your toes. Hold on to a chair for balance, but do not use it for support. Slowly unlock your knee and lower yourself to the starting position. Repeat __________ times. Complete this exercise __________ times a day. Contact a health care provider if: Your exercises cause pain. Your pain is worse after you exercise. Your pain prevents you from doing your exercises. This information is not intended to replace advice given to you by your health care provider. Make sure you discuss any questions you have with your health care provider. Document Revised: 04/04/2022 Document Reviewed: 04/04/2022 Elsevier Patient Education  2024 [employer]  Hand Exercises Hand exercises can be helpful for almost anyone. They can strengthen your hands and improve flexibility and movement. The exercises can also increase blood flow to the hands. These results can make your work and daily tasks easier for you. Hand exercises can be especially helpful for people who have joint pain from arthritis or nerve damage from using their hands over and over. These exercises can also help  people who injure a hand. Exercises Most of these hand exercises are gentle stretching and motion exercises. It is usually safe to do them often throughout the day. Warming up your hands before exercise may help reduce stiffness. You can do this with gentle massage or by placing your hands in warm water for 10-15 minutes. It is normal to feel some stretching, pulling, tightness, or mild discomfort when you begin new exercises. In time, this will improve. Remember to always be careful and stop right away if you feel sudden, very bad pain or your pain gets worse. You want to get better and be safe. Ask your health care provider which exercises are safe for you. Do exercises exactly as told by your provider and adjust them as told. Do not begin these exercises until told by your provider. Knuckle bend or "claw" fist  Stand or sit with your arm, hand, and all five fingers pointed straight up. Make sure to keep your wrist straight. Gently bend your fingers down toward your palm until the tips of your  fingers are touching your palm. Keep your big knuckle straight and only bend the small knuckles in your fingers. Hold this position for 10 seconds. Straighten your fingers back to your starting position. Repeat this exercise 5-10 times with each hand. Full finger fist  Stand or sit with your arm, hand, and all five fingers pointed straight up. Make sure to keep your wrist straight. Gently bend your fingers into your palm until the tips of your fingers are touching the middle of your palm. Hold this position for 10 seconds. Extend your fingers back to your starting position, stretching every joint fully. Repeat this exercise 5-10 times with each hand. Straight fist  Stand or sit with your arm, hand, and all five fingers pointed straight up. Make sure to keep your wrist straight. Gently bend your fingers at the big knuckle, where your fingers meet your hand, and at the middle knuckle. Keep the knuckle at  the tips of your fingers straight and try to touch the bottom of your palm. Hold this position for 10 seconds. Extend your fingers back to your starting position, stretching every joint fully. Repeat this exercise 5-10 times with each hand. Tabletop  Stand or sit with your arm, hand, and all five fingers pointed straight up. Make sure to keep your wrist straight. Gently bend your fingers at the big knuckle, where your fingers meet your hand, as far down as you can. Keep the small knuckles in your fingers straight. Think of forming a tabletop with your fingers. Hold this position for 10 seconds. Extend your fingers back to your starting position, stretching every joint fully. Repeat this exercise 5-10 times with each hand. Finger spread  Place your hand flat on a table with your palm facing down. Make sure your wrist stays straight. Spread your fingers and thumb apart from each other as far as you can until you feel a gentle stretch. Hold this position for 10 seconds. Bring your fingers and thumb tight together again. Hold this position for 10 seconds. Repeat this exercise 5-10 times with each hand. Making circles  Stand or sit with your arm, hand, and all five fingers pointed straight up. Make sure to keep your wrist straight. Make a circle by touching the tip of your thumb to the tip of your index finger. Hold for 10 seconds. Then open your hand wide. Repeat this motion with your thumb and each of your fingers. Repeat this exercise 5-10 times with each hand. Thumb motion  Sit with your forearm resting on a table and your wrist straight. Your thumb should be facing up toward the ceiling. Keep your fingers relaxed as you move your thumb. Lift your thumb up as high as you can toward the ceiling. Hold for 10 seconds. Bend your thumb across your palm as far as you can, reaching the tip of your thumb for the small finger (pinkie) side of your palm. Hold for 10 seconds. Repeat this exercise  5-10 times with each hand. Grip strengthening  Hold a stress ball or other soft ball in the middle of your hand. Slowly increase the pressure, squeezing the ball as much as you can without causing pain. Think of bringing the tips of your fingers into the middle of your palm. All of your finger joints should bend when doing this exercise. Hold your squeeze for 10 seconds, then relax. Repeat this exercise 5-10 times with each hand. Contact a health care provider if: Your hand pain or discomfort gets much worse when  you do an exercise. Your hand pain or discomfort does not improve within 2 hours after you exercise. If you have either of these problems, stop doing these exercises right away. Do not do them again unless your provider says that you can. Get help right away if: You develop sudden, severe hand pain or swelling. If this happens, stop doing these exercises right away. Do not do them again unless your provider says that you can. This information is not intended to replace advice given to you by your health care provider. Make sure you discuss any questions you have with your health care provider. Document Revised: 04/04/2022 Document Reviewed: 04/04/2022 Elsevier Patient Education  2024 [employer].

## 2023-07-12 NOTE — Progress Notes (Signed)
 Urine protein creatinine ratio normal, CBC stable, CMP normal, SSA antibody remains positive, double-stranded DNA remains negative, complements normal, sed rate normal, vitamin D normal, CK normal, TSH normal, SPEP pending.  Labs do not indicate an autoimmune disease flare.

## 2023-07-13 LAB — COMPREHENSIVE METABOLIC PANEL WITH GFR
AG Ratio: 1.5 (calc) (ref 1.0–2.5)
ALT: 20 U/L (ref 6–29)
AST: 21 U/L (ref 10–35)
Albumin: 4.2 g/dL (ref 3.6–5.1)
Alkaline phosphatase (APISO): 72 U/L (ref 37–153)
BUN: 10 mg/dL (ref 7–25)
CO2: 32 mmol/L (ref 20–32)
Calcium: 9.5 mg/dL (ref 8.6–10.4)
Chloride: 104 mmol/L (ref 98–110)
Creat: 1 mg/dL (ref 0.50–1.03)
Globulin: 2.8 g/dL (ref 1.9–3.7)
Glucose, Bld: 93 mg/dL (ref 65–99)
Potassium: 3.8 mmol/L (ref 3.5–5.3)
Sodium: 141 mmol/L (ref 135–146)
Total Bilirubin: 0.4 mg/dL (ref 0.2–1.2)
Total Protein: 7 g/dL (ref 6.1–8.1)
eGFR: 67 mL/min/{1.73_m2} (ref >=60)

## 2023-07-13 LAB — PROTEIN / CREATININE RATIO, URINE
Creatinine, Urine: 29 mg/dL (ref 20–275)
Total Protein, Urine: <4 mg/dL — ABNORMAL LOW (ref 5–24)

## 2023-07-13 LAB — CBC WITH DIFFERENTIAL/PLATELET
Absolute Lymphocytes: 1585 {cells}/uL (ref 850–3900)
Absolute Monocytes: 600 {cells}/uL (ref 200–950)
Basophils Absolute: 40 {cells}/uL (ref 0–200)
Basophils Relative: 0.8 %
Eosinophils Absolute: 220 {cells}/uL (ref 15–500)
Eosinophils Relative: 4.4 %
HCT: 40.3 % (ref 35.0–45.0)
Hemoglobin: 13.5 g/dL (ref 11.7–15.5)
MCH: 34.9 pg — ABNORMAL HIGH (ref 27.0–33.0)
MCHC: 33.5 g/dL (ref 32.0–36.0)
MCV: 104.1 fL — ABNORMAL HIGH (ref 80.0–100.0)
MPV: 10.4 fL (ref 7.5–12.5)
Monocytes Relative: 12 %
Neutro Abs: 2555 {cells}/uL (ref 1500–7800)
Neutrophils Relative %: 51.1 %
Platelets: 178 10*3/uL (ref 140–400)
RBC: 3.87 10*6/uL (ref 3.80–5.10)
RDW: 11.9 % (ref 11.0–15.0)
Total Lymphocyte: 31.7 %
WBC: 5 10*3/uL (ref 3.8–10.8)

## 2023-07-13 LAB — PROTEIN ELECTROPHORESIS, SERUM, WITH REFLEX
Albumin ELP: 4.2 g/dL (ref 3.8–4.8)
Alpha 1: 0.3 g/dL (ref 0.2–0.3)
Alpha 2: 0.6 g/dL (ref 0.5–0.9)
Beta 2: 0.5 g/dL (ref 0.2–0.5)
Beta Globulin: 0.4 g/dL (ref 0.4–0.6)
Gamma Globulin: 1.1 g/dL (ref 0.8–1.7)
Total Protein: 7 g/dL (ref 6.1–8.1)

## 2023-07-13 LAB — ANTI-DNA ANTIBODY, DOUBLE-STRANDED: ds DNA Ab: <1 [IU]/mL

## 2023-07-13 LAB — C3 AND C4
C3 Complement: 139 mg/dL (ref 83–193)
C4 Complement: 26 mg/dL (ref 15–57)

## 2023-07-13 LAB — SEDIMENTATION RATE: Sed Rate: 28 mm/h (ref 0–30)

## 2023-07-13 LAB — TSH: TSH: 1.1 m[IU]/L

## 2023-07-13 LAB — VITAMIN D 25 HYDROXY (VIT D DEFICIENCY, FRACTURES): Vit D, 25-Hydroxy: 42 ng/mL (ref 30–100)

## 2023-07-13 LAB — CK: Total CK: 151 U/L (ref 21–240)

## 2023-07-13 LAB — SJOGRENS SYNDROME-A EXTRACTABLE NUCLEAR ANTIBODY: SSA (Ro) (ENA) Antibody, IgG: >8 AI — AB

## 2023-07-13 NOTE — Progress Notes (Signed)
 SPEP  normal.

## 2023-07-31 ENCOUNTER — Ambulatory Visit (INDEPENDENT_AMBULATORY_CARE_PROVIDER_SITE_OTHER): Payer: BC Managed Care – PPO | Admitting: Physician Assistant

## 2023-07-31 ENCOUNTER — Encounter: Payer: Self-pay | Admitting: Physician Assistant

## 2023-07-31 VITALS — BP 127/59 | HR 83 | Ht 64.0 in | Wt 167.0 lb

## 2023-07-31 DIAGNOSIS — F411 Generalized anxiety disorder: Secondary | ICD-10-CM

## 2023-07-31 DIAGNOSIS — E039 Hypothyroidism, unspecified: Secondary | ICD-10-CM

## 2023-07-31 DIAGNOSIS — F439 Reaction to severe stress, unspecified: Secondary | ICD-10-CM

## 2023-07-31 MED ORDER — VENLAFAXINE HCL ER 150 MG PO CP24: 150.0000 mg | ORAL_CAPSULE | Freq: Every day | ORAL | 3 refills | Status: AC

## 2023-08-01 ENCOUNTER — Encounter: Payer: Self-pay | Admitting: Physician Assistant

## 2023-08-01 NOTE — Progress Notes (Signed)
 Established Patient Office Visit  Subjective   Patient ID: Heidi Carney, female    DOB: [birth year]  Age: 58 y.o. MRN: [id number]  Chief Complaint  Patient presents with   Medical Management of Chronic Issues    HPI Pt is a 58 yo obese female who presents to the clinic for 6 month refills.    Hypothyroidism doing well on medications with no concerns.   Pt denies any SI/HC. She feels like mood is well controlled on effexor . No side effects.   ROS See HPI.    Objective:     BP (!) 127/59   Pulse 83   Ht 5\' 4"  (1.626 m)   Wt 167 lb (75.8 kg)   LMP 01/21/2015   SpO2 99%   BMI 28.67 kg/m  BP Readings from Last 3 Encounters:  07/31/23 (!) 127/59  07/10/23 133/84  05/14/23 (!) 143/64   Wt Readings from Last 3 Encounters:  07/31/23 167 lb (75.8 kg)  07/10/23 169 lb (76.7 kg)  05/14/23 167 lb (75.8 kg)      Physical Exam Constitutional:      Appearance: Normal appearance. She is obese.  HENT:     Head: Normocephalic.  Cardiovascular:     Rate and Rhythm: Normal rate and regular rhythm.  Pulmonary:     Effort: Pulmonary effort is normal.     Breath sounds: Normal breath sounds.  Musculoskeletal:     Right lower leg: No edema.     Left lower leg: No edema.  Neurological:     General: No focal deficit present.     Mental Status: She is alert and oriented to person, place, and time.  Psychiatric:        Mood and Affect: Mood normal.    ..    07/31/2023    9:41 AM 04/03/2023   12:01 PM 07/31/2022    9:53 AM 07/27/2021    9:48 AM 05/21/2020    9:01 AM  Depression screen PHQ 2/9  Decreased Interest 0 0 0 0 0  Down, Depressed, Hopeless 0 1 0 0 0  PHQ - 2 Score 0 1 0 0 0  Altered sleeping 1   2 0  Tired, decreased energy 1   2 1   Change in appetite 0   0 0  Feeling bad or failure about yourself  0   1 0  Trouble concentrating 0   1 0  Moving slowly or fidgety/restless 0   0 0  Suicidal thoughts 0   0 0  PHQ-9 Score 2   6 1   Difficult doing  work/chores Not difficult at all   Somewhat difficult Not difficult at all   ..    07/31/2023    9:41 AM 01/31/2023    8:11 AM 07/27/2021    9:48 AM 05/21/2020    9:01 AM  GAD 7 : Generalized Anxiety Score  Nervous, Anxious, on Edge 1 1 1  0  Control/stop worrying 0 0 0 0  Worry too much - different things 0 1 1 0  Trouble relaxing 0 0 0 0  Restless 0 0 0 1  Easily annoyed or irritable 1 1 1  0  Afraid - awful might happen 0 0 0 0  Total GAD 7 Score 2 3 3 1   Anxiety Difficulty Not difficult at all Somewhat difficult Somewhat difficult Not difficult at all       The 10-year ASCVD risk score (Arnett DK, et al., 2019) is: 2%  Assessment & Plan:  Heidi AasAaron AasDina Francisco "Pam" was seen today for medical management of chronic issues.  Diagnoses and all orders for this visit:  Acquired hypothyroidism  Stress -     venlafaxine  XR (EFFEXOR -XR) 150 MG 24 hr capsule; Take 1 capsule (150 mg total) by mouth daily with breakfast.  GAD (generalized anxiety disorder) -     venlafaxine  XR (EFFEXOR -XR) 150 MG 24 hr capsule; Take 1 capsule (150 mg total) by mouth daily with breakfast.   TSH UTD No refills needed  PHQ/GAD to goal Refilled effexor   Vitals look great    Return in about 6 months (around 01/30/2024) for CPE.    Ha Placeres, PA-C

## 2023-08-09 ENCOUNTER — Telehealth: Payer: Self-pay | Admitting: *Deleted

## 2023-08-09 NOTE — Telephone Encounter (Signed)
 Attempted to contact the patient and left message for patient to call the office.

## 2023-08-09 NOTE — Telephone Encounter (Signed)
 Patient contacted the office back, patient states she is getting regular dental exams. Patient states she has two different dental insurances and that could be why there was confusion.

## 2023-08-09 NOTE — Telephone Encounter (Signed)
 Received a fax from [facility]. Need to remind patient to make sure she is having her dental exams.

## 2023-09-14 ENCOUNTER — Other Ambulatory Visit: Payer: Self-pay | Admitting: Physician Assistant

## 2023-09-14 DIAGNOSIS — E538 Deficiency of other specified B group vitamins: Secondary | ICD-10-CM

## 2023-10-30 ENCOUNTER — Other Ambulatory Visit: Payer: Self-pay | Admitting: Rheumatology

## 2023-10-31 NOTE — Telephone Encounter (Signed)
 Last Fill: 05/28/2023  Eye exam: 06/28/2023 Normal    Labs: 07/10/2023 Urine protein creatinine ratio normal, CBC stable, CMP normal, SSA antibody remains positive, double-stranded DNA remains negative, complements normal, sed rate normal, vitamin D  normal, CK normal, TSH normal, SPEP pending.  Labs do not indicate an autoimmune disease flare.   Next Visit: 12/19/2023  Last Visit: 07/10/2023  IK:Dgnhmzw'd syndrome with keratoconjunctivitis sicca   Current Dose per office note 07/10/2023: Plaquenil  200 mg 1 tablet by mouth twice daily Monday through Friday   Okay to refill Plaquenil ?

## 2023-12-05 NOTE — Progress Notes (Unsigned)
 Office Visit Note  Patient: Heidi Carney             Date of Birth: 01-Dec-1965           MRN: [id number]             PCP: Antoniette Vermell LITTIE DEVONNA Referring: Antoniette Vermell LITTIE, PA-C Visit Date: 12/19/2023 Occupation: @GUAROCC @  Subjective:  Medication monitoring   History of Present Illness: Heidi Carney is a 58 y.o. female with history of sjogren's syndrome and osteoarthritis.  Patient remains on plaquenil  200 mg 1 tablet by mouth twice daily Monday through Friday.  She is tolerating Plaquenil  without any side effects and has not had any recent gaps in therapy.  Patient reports that her sicca symptoms have been manageable with the use of over-the-counter products.  She denies any parotid swelling or tenderness.  She denies any salivary stones.  She denies any swollen lymph nodes.  Patient continues to see the dentist every 6 months and her eye doctor every 6 months.  She denies any new pulmonary symptoms.  She has not had any shortness of breath.  She denies any new medical conditions. She experiences some stiffness in both hands but denies any joint swelling.   Activities of Daily Living:  Patient reports morning stiffness for 15-30 minutes.   Patient Denies nocturnal pain.  Difficulty dressing/grooming: Denies Difficulty climbing stairs: Denies Difficulty getting out of chair: Denies Difficulty using hands for taps, buttons, cutlery, and/or writing: Denies  Review of Systems  Constitutional:  Positive for fatigue.  HENT:  Positive for mouth dryness. Negative for mouth sores.   Eyes:  Positive for dryness.  Respiratory:  Negative for shortness of breath.   Cardiovascular:  Negative for chest pain and palpitations.  Gastrointestinal:  Positive for constipation. Negative for blood in stool and diarrhea.  Endocrine: Positive for increased urination.  Genitourinary:  Negative for involuntary urination.  Musculoskeletal:  Positive for joint pain, joint pain and morning stiffness.  Negative for gait problem, joint swelling, myalgias, muscle weakness, muscle tenderness and myalgias.  Skin:  Positive for sensitivity to sunlight. Negative for color change, rash and hair loss.  Allergic/Immunologic: Negative for susceptible to infections.  Neurological:  Negative for dizziness and headaches.  Hematological:  Negative for swollen glands.  Psychiatric/Behavioral:  Negative for depressed mood and sleep disturbance. The patient is not nervous/anxious.     PMFS History:  Patient Active Problem List   Diagnosis Date Noted   Dysfunction of both eustachian tubes 04/24/2023   Middle ear effusion, bilateral 04/24/2023   Conductive hearing loss of right ear with unrestricted hearing of left ear 04/24/2023   Muscle cramp 07/31/2022   Vaginal dryness 07/31/2022   Osteopenia 08/03/2021   Sulfite allergy 05/21/2020   Adverse reaction to food, subsequent encounter 03/30/2020   Haglund's deformity of left heel 02/06/2020   Stress 02/06/2020   Itching 02/06/2020   Myopia with presbyopia of both eyes 05/17/2018   Sjogren's syndrome with keratoconjunctivitis sicca (HCC) 04/30/2018   Family history of psoriatic arthritis 04/08/2018   Family history of morphea 04/08/2018   Allergic urticaria 02/06/2018   Angioedema 02/06/2018   Food intolerance 02/06/2018   Chronic rhinitis 02/06/2018   Increased pressure in the eye, left 01/02/2018   Colon polyp 12/18/2017   Grade I internal hemorrhoids 12/18/2017   Bouchard's nodes 12/17/2017   Joint stiffness 12/17/2017   Primary osteoarthritis of both hands 12/17/2017   Surgical 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 replacement therapy 12/17/2017   Multiple food  allergies 07/12/2017   Nausea 07/07/2017   Intractable cyclical vomiting with nausea 07/07/2017   Diarrhea 07/07/2017   Abdominal cramping 07/07/2017   Allergic reaction 07/07/2017   Ovarian mass 05/31/2017   Grade II hemorrhoids 04/05/2017   Change in stool 02/12/2017   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/06/2017   Left lower quadrant pain 01/24/2017   Slow transit constipation 01/24/2017   Right ovarian cyst 01/24/2017   Essential hypertension 01/09/2017   Migraine headache without aura 07/10/2016   Hair loss 11/16/2015   RLS (restless legs syndrome) 11/16/2015   B12 deficiency 11/16/2015   Vitamin D  deficiency 11/16/2015   Perimenopausal symptoms 09/13/2015   Overweight 03/17/2015   Seborrheic dermatitis 03/10/2015   GAD (generalized anxiety disorder) 01/26/2015   Elevated blood pressure reading 01/26/2015   Thyroid  activity decreased 01/26/2015    Past Medical History:  Diagnosis Date   Anxiety    Arthritis    H/O total vaginal hysterectomy 05/2017   Hypothyroidism    Sjogren's disease (HCC)     Family History  Problem Relation Age of Onset   Diabetes Father    Cancer Father        prostate CA   Stroke Father    Glaucoma Mother    Arthritis Mother    Asthma Sister    Psoriasis Sister        psoriatic arthritis   Arthritis Sister    Scleroderma Sister    Eczema Sister    Asthma Sister    Healthy Daughter    Angioedema Neg Hx    Immunodeficiency Neg Hx    Urticaria Neg Hx    Past Surgical History:  Procedure Laterality Date   ABDOMINAL HYSTERECTOMY  2019   including ovaries   CESAREAN SECTION  2012   EYE SURGERY     LAPAROSCOPIC TOTAL HYSTERECTOMY  05/2017   MOHS SURGERY  03/08/2022   forehead   Social History   Social History Narrative   Not on file   Immunization History  Administered Date(s) Administered   Influenza Inj Mdck Quad Pf 01/08/2019   Influenza, Quadrivalent, Recombinant, Inj, Pf 12/21/2016, 03/15/2018   Influenza,inj,Quad PF,6+ Mos 03/17/2015, 02/02/2016   PFIZER(Purple Top)SARS-COV-2 Vaccination 05/30/2019, 06/27/2019, 01/09/2020   Tdap 07/27/2021     Objective: Vital Signs: BP 124/82   Pulse 86   Temp 97.7 F (36.5 C)   Resp 14   Ht 5' 4 (1.626 m)   Wt 166 lb (75.3 kg)   LMP 01/21/2015   BMI 28.49 kg/m    Physical  Exam Vitals and nursing note reviewed.  Constitutional:      Appearance: She is well-developed.  HENT:     Head: Normocephalic and atraumatic.  Eyes:     Conjunctiva/sclera: Conjunctivae normal.  Cardiovascular:     Rate and Rhythm: Normal rate and regular rhythm.     Heart sounds: Normal heart sounds.  Pulmonary:     Effort: Pulmonary effort is normal.     Breath sounds: Normal breath sounds.  Abdominal:     General: Bowel sounds are normal.     Palpations: Abdomen is soft.  Musculoskeletal:     Cervical back: Normal range of motion.  Lymphadenopathy:     Cervical: No cervical adenopathy.  Skin:    General: Skin is warm and dry.     Capillary Refill: Capillary refill takes less than 2 seconds.  Neurological:     Mental Status: She is alert and oriented to person, place, and time.  Psychiatric:        Behavior: Behavior normal.      Musculoskeletal Exam: C-spine, thoracic spine, lumbar spine have good range of motion.  No midline spinal tenderness.  No SI joint tenderness.  Shoulder joints, elbow joints, wrist joints, MCPs, PIPs, DIPs have good range of motion with no synovitis.  Significant PIP and DIP thickening and prominence consistent with osteoarthritis of both hands.  Mild subluxation of the DIP joint of the left index and middle finger noted.  Complete fist formation bilaterally.  Hip joints have good range of motion with no groin pain.  Knee joints have good range of motion no warmth or effusion.  Crepitus of both knees noted with range of motion.  Ankle joints have good range of motion no tenderness or joint swelling.  No evidence of Achilles tendinitis or plantar fasciitis.   CDAI Exam: CDAI Score: -- Patient Global: --; Provider Global: -- Swollen: --; Tender: -- Joint Exam 12/19/2023   No joint exam has been documented for this visit   There is currently no information documented on the homunculus. Go to the Rheumatology activity and complete the homunculus  joint exam.  Investigation: No additional findings.  Imaging: No results found.  Recent Labs: Lab Results  Component Value Date   WBC 5.0 07/10/2023   HGB 13.5 07/10/2023   PLT 178 07/10/2023   NA 141 07/10/2023   K 3.8 07/10/2023   CL 104 07/10/2023   CO2 32 07/10/2023   GLUCOSE 93 07/10/2023   BUN 10 07/10/2023   CREATININE 1.00 07/10/2023   BILITOT 0.4 07/10/2023   ALKPHOS 95 01/30/2023   AST 21 07/10/2023   ALT 20 07/10/2023   PROT 7.0 07/10/2023   PROT 7.0 07/10/2023   ALBUMIN 4.4 01/30/2023   CALCIUM 9.5 07/10/2023   GFRAA 86 07/06/2020    Speciality Comments: PLQ Eye Exam 06/28/2023 Normal @ [facility] [place] f/u 6 months   Procedures:  No procedures performed Allergies: Shrimp [shellfish allergy], Celexa  [citalopram  hydrobromide], Elemental sulfur, Sulfur, Macrobid  [nitrofurantoin ], Sulfites, Synthroid  [levothyroxine  sodium], Z-pak [azithromycin ], and Shrimp extract   Assessment / Plan:     Visit Diagnoses: Sjogren's syndrome with keratoconjunctivitis sicca (HCC) - Sicca symptoms with positive ANA, positive Ro, negative RF: She continues to have mild sicca symptoms which have been tolerable with the use of over-the-counter products.  No parotid swelling or tenderness was noted on examination today.  No cervical lymphadenopathy.  She has not had any salivary stones or oral ulcers.  She continues to see the dentist every 6 months and her eye doctor every 6 months for routine follow-up. She has no synovitis on examination today.  She has been taking Plaquenil  200 mg 1 tablet by mouth twice daily Monday through Friday.  She continues to tolerate Plaquenil  without any side effects and has not had any gaps in therapy. Lungs were clear to auscultation today.  No new or worsening pulmonary symptoms.  Discussed increased risk for developing interstitial lung disease in patients with Sjogren syndrome. Also discussed increased risk for developing lymphoma  in patients with Sjogren syndrome.  SPEP normal on 07/10/2023. Lab work from 07/10/2023 was reviewed today in the office: No proteinuria, CMP within normal limits, double-stranded a negative, complements within normal limits, ESR within normal limits, Ro antibody remains positive, SPEP normal, vitamin D  within normal limits, TSH within normal limits, CK within normal limits.  Plan to update the following lab work today for further evaluation. - Plan: CBC with Differential/Platelet, Comprehensive  metabolic panel with GFR, ANA, Urinalysis, Routine w reflex microscopic, C3 and C4, Sedimentation rate, Sjogrens syndrome-A extractable nuclear antibody, Rheumatoid factor, Serum protein electrophoresis with reflex  High risk medication use - Plaquenil  200 mg 1 tablet by mouth twice daily Monday through Friday.  CBC and CMP updated on 07/10/23.  Orders for CBC and CMP were released today. PLQ Eye Exam 06/28/2023 Normal @ [facility]  - Plan: CBC with Differential/Platelet, Comprehensive metabolic panel with GFR  Primary osteoarthritis of both hands: She has significant PIP and DIP thickening consistent with osteoarthritis of both hands.  Subluxation of the DIP joints of the left index and middle finger noted.  No synovitis noted.  Discussed the importance of joint protection and muscle strengthening.  She was given a handout of hand exercises to perform.  Primary osteoarthritis of both feet: She has good ROM of both ankle joints with no tenderness or synovitis.  No tenderness or synovitis over MTP joints.  PIP and DIP thickening consistent with osteoarthritis of both feet noted.  Trapezius muscle spasm: Not currently symptomatic.  Primary osteoarthritis of both knees - Bilateral moderate osteoarthritis and chondromalacia patella.  She has good range of motion of both knee joints on examination today.  No warmth or effusion noted.  Crepitus noted with range of motion.  Other fatigue:  Chronic, stable.   Osteopenia of multiple sites - DEXA Aug 03, 2021  T-score of -2.0, BMD 0.767 in the right femoral neck consistent with osteopenia. DEXA overdue.  Vitamin D  insufficiency: Vitamin D  was within normal limits: 42 on 07/10/2023.  Other medical conditions are listed as follows:  Essential hypertension: Blood pressure was 124/82 today in the office.  Increased pressure in the eye, left  Migraine without aura and without status migrainosus, not intractable  GAD (generalized anxiety disorder)  RLS (restless legs syndrome)  Allergic urticaria  Vitamin B12 deficiency  History of hypothyroidism  Family history of morphea  Family history of psoriatic arthritis  Orders: Orders Placed This Encounter  Procedures   CBC with Differential/Platelet   Comprehensive metabolic panel with GFR   ANA   Urinalysis, Routine w reflex microscopic   C3 and C4   Sedimentation rate   Sjogrens syndrome-A extractable nuclear antibody   Rheumatoid factor   Serum protein electrophoresis with reflex   No orders of the defined types were placed in this encounter.    Follow-Up Instructions: Return in about 5 months (around 05/20/2024) for Sjogren's syndrome, Osteoarthritis.   Waddell CHRISTELLA Craze, PA-C  Note - This record has been created using Dragon software.  Chart creation errors have been sought, but may not always  have been located. Such creation errors do not reflect on  the standard of medical care.

## 2023-12-10 ENCOUNTER — Ambulatory Visit: Admitting: Physician Assistant

## 2023-12-17 ENCOUNTER — Ambulatory Visit (INDEPENDENT_AMBULATORY_CARE_PROVIDER_SITE_OTHER): Admitting: Physician Assistant

## 2023-12-17 ENCOUNTER — Encounter: Payer: Self-pay | Admitting: Physician Assistant

## 2023-12-17 VITALS — BP 145/90 | HR 92 | Temp 97.8°F | Ht 64.0 in | Wt 163.0 lb

## 2023-12-17 DIAGNOSIS — R051 Acute cough: Secondary | ICD-10-CM | POA: Diagnosis not present

## 2023-12-17 DIAGNOSIS — J0140 Acute pansinusitis, unspecified: Secondary | ICD-10-CM

## 2023-12-17 MED ORDER — AMOXICILLIN-POT CLAVULANATE 875-125 MG PO TABS: 1.0000 | ORAL_TABLET | Freq: Two times a day (BID) | ORAL | 0 refills | Status: DC

## 2023-12-17 MED ORDER — PROMETHAZINE-DM 6.25-15 MG/5ML PO SYRP: 5.0000 mL | ORAL_SOLUTION | Freq: Four times a day (QID) | ORAL | 0 refills | Status: DC | PRN

## 2023-12-17 NOTE — Patient Instructions (Addendum)

## 2023-12-17 NOTE — Progress Notes (Signed)
 Acute Office Visit  Subjective:     Patient ID: Heidi Carney, female    DOB: Sep 15, 1965, 58 y.o.   MRN: [id number]  Chief Complaint  Patient presents with   Cough    Sinus and chest congestion onset 3 weeks    HPI Patient is in today for cough and sinus congestion that has been going on for 3 weeks. It started as just a cough and body aches in the first week. In week 2 and 3 the cough became productive with yellow-green sputum and yellow-green rhinorrhea. She has tried taking Advil Cold an Flu medicine but it has not provided any relief. She states she has some chest discomfort from all the coughing but she denies SOB. She is also positive for fatigue, mild left ear fullness, and sinus pressure. She denies fever but has had elevated temps at home around 99 degrees F. She has not tested for covid or flu at home and not had any of her annual vaccines this year. She is on immunosuppressant medications.   ROS See HPI     Objective:    BP (!) 145/90 (BP Location: Right Arm, Patient Position: Sitting, Cuff Size: Normal)   Pulse 92   Temp 97.8 F (36.6 C) (Oral)   Ht 5' 4 (1.626 m)   Wt 163 lb (73.9 kg)   LMP 01/21/2015   SpO2 98%   BMI 27.98 kg/m  BP Readings from Last 3 Encounters:  12/17/23 (!) 145/90  07/31/23 (!) 127/59  07/10/23 133/84   Wt Readings from Last 3 Encounters:  12/17/23 163 lb (73.9 kg)  07/31/23 167 lb (75.8 kg)  07/10/23 169 lb (76.7 kg)      Physical Exam Constitutional:      Appearance: Normal appearance. She is obese.  HENT:     Head: Normocephalic.     Comments: Maxillary tenderness to palpation.     Right Ear: Tympanic membrane, ear canal and external ear normal. There is no impacted cerumen.     Left Ear: Tympanic membrane, ear canal and external ear normal. There is no impacted cerumen.     Nose: Congestion and rhinorrhea present.     Mouth/Throat:     Mouth: Mucous membranes are moist.     Pharynx: Posterior oropharyngeal erythema  present. No oropharyngeal exudate.  Eyes:     Conjunctiva/sclera: Conjunctivae normal.  Cardiovascular:     Rate and Rhythm: Normal rate and regular rhythm.  Pulmonary:     Effort: Pulmonary effort is normal.     Breath sounds: Normal breath sounds.  Musculoskeletal:     Cervical back: Neck supple. Tenderness present.  Lymphadenopathy:     Cervical: No cervical adenopathy.  Neurological:     General: No focal deficit present.     Mental Status: She is alert and oriented to person, place, and time.  Psychiatric:        Mood and Affect: Mood normal.          Assessment & Plan:  Heidi Carney was seen today for cough.  Diagnoses and all orders for this visit:  Acute non-recurrent pansinusitis -     amoxicillin -clavulanate (AUGMENTIN ) 875-125 MG tablet; Take 1 tablet by mouth 2 (two) times daily.  Acute cough -     promethazine -dextromethorphan (PROMETHAZINE -DM) 6.25-15 MG/5ML syrup; Take 5 mLs by mouth 4 (four) times daily as needed.   3 weeks of symptoms treated for sinusitis with augmentin  and given promethazine -DM for cough as needed Rest and hydrate Consider  flonase  for nasal spray Follow up as needed if symptoms persist or worsen  Biff Rutigliano, PA-C

## 2023-12-19 ENCOUNTER — Encounter: Payer: Self-pay | Admitting: Physician Assistant

## 2023-12-19 ENCOUNTER — Ambulatory Visit: Payer: Self-pay | Attending: Physician Assistant | Admitting: Physician Assistant

## 2023-12-19 VITALS — BP 124/82 | HR 86 | Temp 97.7°F | Resp 14 | Ht 64.0 in | Wt 166.0 lb

## 2023-12-19 DIAGNOSIS — M62838 Other muscle spasm: Secondary | ICD-10-CM

## 2023-12-19 DIAGNOSIS — Z840 Family history of diseases of the skin and subcutaneous tissue: Secondary | ICD-10-CM

## 2023-12-19 DIAGNOSIS — F411 Generalized anxiety disorder: Secondary | ICD-10-CM

## 2023-12-19 DIAGNOSIS — M19042 Primary osteoarthritis, left hand: Secondary | ICD-10-CM

## 2023-12-19 DIAGNOSIS — M19071 Primary osteoarthritis, right ankle and foot: Secondary | ICD-10-CM

## 2023-12-19 DIAGNOSIS — L500 Allergic urticaria: Secondary | ICD-10-CM

## 2023-12-19 DIAGNOSIS — M3501 Sicca syndrome with keratoconjunctivitis: Secondary | ICD-10-CM | POA: Diagnosis not present

## 2023-12-19 DIAGNOSIS — M19072 Primary osteoarthritis, left ankle and foot: Secondary | ICD-10-CM

## 2023-12-19 DIAGNOSIS — M19041 Primary osteoarthritis, right hand: Secondary | ICD-10-CM | POA: Diagnosis not present

## 2023-12-19 DIAGNOSIS — R5383 Other fatigue: Secondary | ICD-10-CM

## 2023-12-19 DIAGNOSIS — I10 Essential (primary) hypertension: Secondary | ICD-10-CM

## 2023-12-19 DIAGNOSIS — Z79899 Other long term (current) drug therapy: Secondary | ICD-10-CM | POA: Diagnosis not present

## 2023-12-19 DIAGNOSIS — M170 Bilateral primary osteoarthritis of knee: Secondary | ICD-10-CM

## 2023-12-19 DIAGNOSIS — H40052 Ocular hypertension, left eye: Secondary | ICD-10-CM

## 2023-12-19 DIAGNOSIS — G2581 Restless legs syndrome: Secondary | ICD-10-CM

## 2023-12-19 DIAGNOSIS — Z8639 Personal history of other endocrine, nutritional and metabolic disease: Secondary | ICD-10-CM

## 2023-12-19 DIAGNOSIS — M8589 Other specified disorders of bone density and structure, multiple sites: Secondary | ICD-10-CM

## 2023-12-19 DIAGNOSIS — E538 Deficiency of other specified B group vitamins: Secondary | ICD-10-CM

## 2023-12-19 DIAGNOSIS — G43009 Migraine without aura, not intractable, without status migrainosus: Secondary | ICD-10-CM

## 2023-12-19 DIAGNOSIS — E559 Vitamin D deficiency, unspecified: Secondary | ICD-10-CM

## 2023-12-19 NOTE — Patient Instructions (Signed)
 Hand Exercises Hand exercises can be helpful for almost anyone. They can strengthen your hands and improve flexibility and movement. The exercises can also increase blood flow to the hands. These results can make your work and daily tasks easier for you. Hand exercises can be especially helpful for people who have joint pain from arthritis or nerve damage from using their hands over and over. These exercises can also help people who injure a hand. Exercises Most of these hand exercises are gentle stretching and motion exercises. It is usually safe to do them often throughout the day. Warming up your hands before exercise may help reduce stiffness. You can do this with gentle massage or by placing your hands in warm water for 10-15 minutes. It is normal to feel some stretching, pulling, tightness, or mild discomfort when you begin new exercises. In time, this will improve. Remember to always be careful and stop right away if you feel sudden, very bad pain or your pain gets worse. You want to get better and be safe. Ask your health care provider which exercises are safe for you. Do exercises exactly as told by your provider and adjust them as told. Do not begin these exercises until told by your provider. Knuckle bend or claw fist  Stand or sit with your arm, hand, and all five fingers pointed straight up. Make sure to keep your wrist straight. Gently bend your fingers down toward your palm until the tips of your fingers are touching your palm. Keep your big knuckle straight and only bend the small knuckles in your fingers. Hold this position for 10 seconds. Straighten your fingers back to your starting position. Repeat this exercise 5-10 times with each hand. Full finger fist  Stand or sit with your arm, hand, and all five fingers pointed straight up. Make sure to keep your wrist straight. Gently bend your fingers into your palm until the tips of your fingers are touching the middle of your  palm. Hold this position for 10 seconds. Extend your fingers back to your starting position, stretching every joint fully. Repeat this exercise 5-10 times with each hand. Straight fist  Stand or sit with your arm, hand, and all five fingers pointed straight up. Make sure to keep your wrist straight. Gently bend your fingers at the big knuckle, where your fingers meet your hand, and at the middle knuckle. Keep the knuckle at the tips of your fingers straight and try to touch the bottom of your palm. Hold this position for 10 seconds. Extend your fingers back to your starting position, stretching every joint fully. Repeat this exercise 5-10 times with each hand. Tabletop  Stand or sit with your arm, hand, and all five fingers pointed straight up. Make sure to keep your wrist straight. Gently bend your fingers at the big knuckle, where your fingers meet your hand, as far down as you can. Keep the small knuckles in your fingers straight. Think of forming a tabletop with your fingers. Hold this position for 10 seconds. Extend your fingers back to your starting position, stretching every joint fully. Repeat this exercise 5-10 times with each hand. Finger spread  Place your hand flat on a table with your palm facing down. Make sure your wrist stays straight. Spread your fingers and thumb apart from each other as far as you can until you feel a gentle stretch. Hold this position for 10 seconds. Bring your fingers and thumb tight together again. Hold this position for 10 seconds. Repeat  this exercise 5-10 times with each hand. Making circles  Stand or sit with your arm, hand, and all five fingers pointed straight up. Make sure to keep your wrist straight. Make a circle by touching the tip of your thumb to the tip of your index finger. Hold for 10 seconds. Then open your hand wide. Repeat this motion with your thumb and each of your fingers. Repeat this exercise 5-10 times with each hand. Thumb  motion  Sit with your forearm resting on a table and your wrist straight. Your thumb should be facing up toward the ceiling. Keep your fingers relaxed as you move your thumb. Lift your thumb up as high as you can toward the ceiling. Hold for 10 seconds. Bend your thumb across your palm as far as you can, reaching the tip of your thumb for the small finger (pinkie) side of your palm. Hold for 10 seconds. Repeat this exercise 5-10 times with each hand. Grip strengthening  Hold a stress ball or other soft ball in the middle of your hand. Slowly increase the pressure, squeezing the ball as much as you can without causing pain. Think of bringing the tips of your fingers into the middle of your palm. All of your finger joints should bend when doing this exercise. Hold your squeeze for 10 seconds, then relax. Repeat this exercise 5-10 times with each hand. Contact a health care provider if: Your hand pain or discomfort gets much worse when you do an exercise. Your hand pain or discomfort does not improve within 2 hours after you exercise. If you have either of these problems, stop doing these exercises right away. Do not do them again unless your provider says that you can. Get help right away if: You develop sudden, severe hand pain or swelling. If this happens, stop doing these exercises right away. Do not do them again unless your provider says that you can.    Sciatica Rehab Ask your health care provider which exercises are safe for you. Do exercises exactly as told by your health care provider and adjust them as directed. It is normal to feel mild stretching, pulling, tightness, or discomfort as you do these exercises. Stop right away if you feel sudden pain or your pain gets worse. Do not begin these exercises until told by your health care provider. Stretching and range-of-motion exercises These exercises warm up your muscles and joints and improve the movement and flexibility of your hips  and back. These exercises also help to relieve pain, numbness, and tingling. Sciatic nerve glide  Sit in a chair with your head facing down toward your chest. Place your hands behind your back. Let your shoulders slump forward. Slowly straighten one of your legs while you tilt your head back as if you are looking toward the ceiling. Only straighten your leg as far as you can without making your symptoms worse. Hold this position for __________ seconds. Slowly return your leg and head back to the starting position. Repeat with your other leg. Repeat __________ times. Complete this exercise __________ times a day. Knee to chest with hip adduction and internal rotation  Lie on your back on a firm surface with both legs straight. Bend one of your knees and move it up toward your chest until you feel a gentle stretch in your lower back and buttock. Then, move your knee toward the shoulder that is on the opposite side from your leg. This is hip adduction and internal rotation. Hold your leg  in this position by holding on to the front of your knee. Hold this position for __________ seconds. Slowly return to the starting position. Repeat with your other leg. Repeat __________ times. Complete this exercise __________ times a day. Prone extension on elbows  Lie on your abdomen on a firm surface. A bed may be too soft for this exercise. Prop yourself up on your elbows. Use your arms to help lift your chest up until you feel a gentle stretch in your abdomen and your lower back. This will place some of your body weight on your elbows. If this is uncomfortable, try stacking pillows under your chest. Your hips should stay down, against the surface that you are lying on. Keep your hip and back muscles relaxed. Hold this position for __________ seconds. Slowly relax your upper body and return to the starting position. Repeat __________ times. Complete this exercise __________ times a day. Strengthening  exercises These exercises build strength and endurance in your back. Endurance is the ability to use your muscles for a long time, even after they get tired. Pelvic tilt This exercise strengthens the muscles that lie deep in the abdomen. Lie on your back on a firm surface. Bend your knees and keep your feet flat on the surface. Tense your abdominal muscles. Tip your pelvis up toward the ceiling and flatten your lower back into the firm surface. To help with this exercise, you may place a small towel under your lower back and try to push your back into the towel. Hold this position for __________ seconds. Let your muscles relax completely before you repeat this exercise. Repeat __________ times. Complete this exercise __________ times a day. Alternating arm and leg raises  Get on your hands and knees on a firm surface. If you are on a hard floor, you may want to use padding, such as an exercise mat, to cushion your knees. Line up your arms and legs. Your hands should be directly below your shoulders, and your knees should be directly below your hips. Lift your left leg behind you. At the same time, raise your right arm and straighten it in front of you. Do not lift your leg higher than your hip. Do not lift your arm higher than your shoulder. Keep your abdominal and back muscles tight. Keep your hips facing the ground. Do not arch your back. Keep your balance carefully, and do not hold your breath. Hold this position for __________ seconds. Slowly return to the starting position. Repeat with your right leg and your left arm. Repeat __________ times. Complete this exercise __________ times a day. Posture and body mechanics Good posture and healthy body mechanics can help to relieve stress in your body's tissues and joints. Body mechanics refers to the movements and positions of your body while you do your daily activities. Posture is part of body mechanics. Good posture means: Your spine is  in its natural S-curve position (neutral). Your shoulders are pulled back slightly. Your head is not tipped forward. Follow these guidelines to improve your posture and body mechanics in your everyday activities. Standing  When standing, keep your spine neutral and your feet about hip width apart. Keep a slight bend in your knees. Your ears, shoulders, and hips should line up. When you do a task in which you stand in one place for a long time, place one foot up on a stable object that is 2-4 inches (5-10 cm) high, such as a footstool. This helps keep your spine  neutral. Sitting  When sitting, keep your spine neutral and keep your feet flat on the floor. Use a footrest, if necessary, and keep your thighs parallel to the floor. Avoid rounding your shoulders, and avoid tilting your head forward. When working at a desk or a computer, keep your desk at a height where your hands are slightly lower than your elbows. Slide your chair under your desk so you are close enough to maintain good posture. When working at a computer, place your monitor at a height where you are looking straight ahead and you do not have to tilt your head forward or downward to look at the screen. Resting  When lying down and resting, avoid positions that are most painful for you. If you have pain with activities such as sitting, bending, stooping, or squatting, lie in a position in which your body does not bend very much. For example, avoid curling up on your side with your arms and knees near your chest (fetal position). If you have pain with activities such as standing for a long time or reaching with your arms, lie with your spine in a neutral position and bend your knees slightly. Try the following positions: Lying on your side with a pillow between your knees. Lying on your back with a pillow under your knees. Lifting  When lifting objects, keep your feet at least shoulder width apart and tighten your abdominal  muscles. Bend your knees and hips and keep your spine neutral. It is important to lift using the strength of your legs, not your back. Do not lock your knees straight out. Always ask for help to lift heavy or awkward objects. This information is not intended to replace advice given to you by your health care provider. Make sure you discuss any questions you have with your health care provider. Document Revised: 06/28/2021 Document Reviewed: 06/28/2021 [employer] Patient Education  2024 [employer].

## 2023-12-20 ENCOUNTER — Ambulatory Visit: Payer: Self-pay | Admitting: Physician Assistant

## 2023-12-20 NOTE — Progress Notes (Signed)
 MCV and MCH are elevated but stable. Rest of CBC WNL.  CMP WNL UA normal Complements WNL RF negative  ESR is slightly elevated-likely related to sinusitis.

## 2023-12-21 LAB — URINALYSIS, ROUTINE W REFLEX MICROSCOPIC
Bilirubin Urine: NEGATIVE
Glucose, UA: NEGATIVE
Hgb urine dipstick: NEGATIVE
Ketones, ur: NEGATIVE
Leukocytes,Ua: NEGATIVE
Nitrite: NEGATIVE
Protein, ur: NEGATIVE
Specific Gravity, Urine: 1.006 (ref 1.001–1.035)
pH: 6 (ref 5.0–8.0)

## 2023-12-21 LAB — C3 AND C4
C3 Complement: 161 mg/dL (ref 83–193)
C4 Complement: 32 mg/dL (ref 15–57)

## 2023-12-21 LAB — CBC WITH DIFFERENTIAL/PLATELET
Absolute Lymphocytes: 1301 {cells}/uL (ref 850–3900)
Absolute Monocytes: 510 {cells}/uL (ref 200–950)
Basophils Absolute: 61 {cells}/uL (ref 0–200)
Basophils Relative: 1.2 %
Eosinophils Absolute: 332 {cells}/uL (ref 15–500)
Eosinophils Relative: 6.5 %
HCT: 40.8 % (ref 35.0–45.0)
Hemoglobin: 13.5 g/dL (ref 11.7–15.5)
MCH: 34.5 pg — ABNORMAL HIGH (ref 27.0–33.0)
MCHC: 33.1 g/dL (ref 32.0–36.0)
MCV: 104.3 fL — ABNORMAL HIGH (ref 80.0–100.0)
MPV: 9.6 fL (ref 7.5–12.5)
Monocytes Relative: 10 %
Neutro Abs: 2897 {cells}/uL (ref 1500–7800)
Neutrophils Relative %: 56.8 %
Platelets: 209 Thousand/uL (ref 140–400)
RBC: 3.91 Million/uL (ref 3.80–5.10)
RDW: 11.7 % (ref 11.0–15.0)
Total Lymphocyte: 25.5 %
WBC: 5.1 Thousand/uL (ref 3.8–10.8)

## 2023-12-21 LAB — COMPREHENSIVE METABOLIC PANEL WITH GFR
AG Ratio: 1.5 (calc) (ref 1.0–2.5)
ALT: 19 U/L (ref 6–29)
AST: 20 U/L (ref 10–35)
Albumin: 4.3 g/dL (ref 3.6–5.1)
Alkaline phosphatase (APISO): 79 U/L (ref 37–153)
BUN: 12 mg/dL (ref 7–25)
CO2: 32 mmol/L (ref 20–32)
Calcium: 9.1 mg/dL (ref 8.6–10.4)
Chloride: 104 mmol/L (ref 98–110)
Creat: 0.89 mg/dL (ref 0.50–1.03)
Globulin: 2.8 g/dL (ref 1.9–3.7)
Glucose, Bld: 71 mg/dL (ref 65–99)
Potassium: 4.6 mmol/L (ref 3.5–5.3)
Sodium: 140 mmol/L (ref 135–146)
Total Bilirubin: 0.3 mg/dL (ref 0.2–1.2)
Total Protein: 7.1 g/dL (ref 6.1–8.1)
eGFR: 76 mL/min/1.73m2 (ref >=60)

## 2023-12-21 LAB — PROTEIN ELECTROPHORESIS, SERUM, WITH REFLEX
Albumin ELP: 4 g/dL (ref 3.8–4.8)
Alpha 1: 0.3 g/dL (ref 0.2–0.3)
Alpha 2: 0.8 g/dL (ref 0.5–0.9)
Beta 2: 0.5 g/dL (ref 0.2–0.5)
Beta Globulin: 0.5 g/dL (ref 0.4–0.6)
Gamma Globulin: 1.1 g/dL (ref 0.8–1.7)
Total Protein: 7.3 g/dL (ref 6.1–8.1)

## 2023-12-21 LAB — SJOGRENS SYNDROME-A EXTRACTABLE NUCLEAR ANTIBODY: SSA (Ro) (ENA) Antibody, IgG: >8 AI — AB

## 2023-12-21 LAB — SEDIMENTATION RATE: Sed Rate: 56 mm/h — ABNORMAL HIGH (ref 0–30)

## 2023-12-21 LAB — RHEUMATOID FACTOR: Rheumatoid fact SerPl-aCnc: <10 [IU]/mL (ref <14)

## 2023-12-21 LAB — ANA: Anti Nuclear Antibody (ANA): NEGATIVE

## 2023-12-21 NOTE — Progress Notes (Signed)
 Ro antibody remains positive  ANA is negative   SPEP pending

## 2023-12-24 NOTE — Progress Notes (Signed)
 SPEP  normal.

## 2024-01-22 ENCOUNTER — Other Ambulatory Visit: Payer: Self-pay | Admitting: Rheumatology

## 2024-01-23 NOTE — Telephone Encounter (Signed)
 Last Fill: 10/31/2023  Eye exam: 06/28/2023 Normal   Labs: 12/19/2023 MCV and MCH are elevated but stable. Rest of CBC WNL.  CMP WNL  Next Visit: 05/28/2024  Last Visit: 12/19/2023  IK:Dgnhmzw'd syndrome with keratoconjunctivitis sicca   Current Dose per office note 12/19/2023:  Plaquenil  200 mg 1 tablet by mouth twice daily Monday through Friday.   Okay to refill Plaquenil ?

## 2024-01-30 ENCOUNTER — Other Ambulatory Visit: Payer: Self-pay | Admitting: Physician Assistant

## 2024-01-30 ENCOUNTER — Encounter: Payer: Self-pay | Admitting: Physician Assistant

## 2024-01-30 ENCOUNTER — Ambulatory Visit (INDEPENDENT_AMBULATORY_CARE_PROVIDER_SITE_OTHER): Admitting: Physician Assistant

## 2024-01-30 VITALS — BP 118/70 | HR 87 | Ht 64.0 in | Wt 163.0 lb

## 2024-01-30 DIAGNOSIS — E039 Hypothyroidism, unspecified: Secondary | ICD-10-CM

## 2024-01-30 DIAGNOSIS — R233 Spontaneous ecchymoses: Secondary | ICD-10-CM | POA: Diagnosis not present

## 2024-01-30 DIAGNOSIS — E538 Deficiency of other specified B group vitamins: Secondary | ICD-10-CM

## 2024-01-30 DIAGNOSIS — I10 Essential (primary) hypertension: Secondary | ICD-10-CM

## 2024-01-30 DIAGNOSIS — Z0000 Encounter for general adult medical examination without abnormal findings: Secondary | ICD-10-CM

## 2024-01-30 DIAGNOSIS — F411 Generalized anxiety disorder: Secondary | ICD-10-CM

## 2024-01-30 DIAGNOSIS — Z91013 Allergy to seafood: Secondary | ICD-10-CM

## 2024-01-30 DIAGNOSIS — M3501 Sicca syndrome with keratoconjunctivitis: Secondary | ICD-10-CM

## 2024-01-30 DIAGNOSIS — E559 Vitamin D deficiency, unspecified: Secondary | ICD-10-CM

## 2024-01-30 MED ORDER — EPINEPHRINE 0.3 MG/0.3ML IJ SOAJ: INTRAMUSCULAR | 1 refills | Status: AC

## 2024-01-30 NOTE — Patient Instructions (Signed)

## 2024-01-31 LAB — CBC WITH DIFFERENTIAL/PLATELET
Basophils Absolute: 0 x10E3/uL (ref 0.0–0.2)
Basos: 1 %
EOS (ABSOLUTE): 0.2 x10E3/uL (ref 0.0–0.4)
Eos: 4 %
Hematocrit: 40.5 % (ref 34.0–46.6)
Hemoglobin: 13.6 g/dL (ref 11.1–15.9)
Immature Grans (Abs): 0 x10E3/uL (ref 0.0–0.1)
Immature Granulocytes: 0 %
Lymphocytes Absolute: 1.3 x10E3/uL (ref 0.7–3.1)
Lymphs: 27 %
MCH: 34.9 pg — ABNORMAL HIGH (ref 26.6–33.0)
MCHC: 33.6 g/dL (ref 31.5–35.7)
MCV: 104 fL — ABNORMAL HIGH (ref 79–97)
Monocytes Absolute: 0.5 x10E3/uL (ref 0.1–0.9)
Monocytes: 11 %
Neutrophils Absolute: 2.7 x10E3/uL (ref 1.4–7.0)
Neutrophils: 57 %
Platelets: 182 x10E3/uL (ref 150–450)
RBC: 3.9 x10E6/uL (ref 3.77–5.28)
RDW: 12 % (ref 11.7–15.4)
WBC: 4.7 x10E3/uL (ref 3.4–10.8)

## 2024-01-31 LAB — TSH+FREE T4
Free T4: 1.1 ng/dL (ref 0.82–1.77)
TSH: 2.15 u[IU]/mL (ref 0.450–4.500)

## 2024-01-31 LAB — PT AND PTT
INR: 1 (ref 0.9–1.2)
Prothrombin Time: 10.8 s (ref 9.1–12.0)
aPTT: 28 s (ref 24–33)

## 2024-01-31 LAB — B12 AND FOLATE PANEL
Folate: >20 ng/mL (ref >3.0)
Vitamin B-12: 891 pg/mL (ref 232–1245)

## 2024-01-31 LAB — LIPID PANEL
Chol/HDL Ratio: 3 ratio (ref 0.0–4.4)
Cholesterol, Total: 177 mg/dL (ref 100–199)
HDL: 60 mg/dL (ref >39)
LDL Chol Calc (NIH): 91 mg/dL (ref 0–99)
Triglycerides: 153 mg/dL — ABNORMAL HIGH (ref 0–149)
VLDL Cholesterol Cal: 26 mg/dL (ref 5–40)

## 2024-01-31 LAB — CMP14+EGFR
ALT: 24 IU/L (ref 0–32)
AST: 26 IU/L (ref 0–40)
Albumin: 4.5 g/dL (ref 3.8–4.9)
Alkaline Phosphatase: 113 IU/L (ref 49–135)
BUN/Creatinine Ratio: 10 (ref 9–23)
BUN: 10 mg/dL (ref 6–24)
Bilirubin Total: 0.5 mg/dL (ref 0.0–1.2)
CO2: 24 mmol/L (ref 20–29)
Calcium: 9.4 mg/dL (ref 8.7–10.2)
Chloride: 101 mmol/L (ref 96–106)
Creatinine, Ser: 0.99 mg/dL (ref 0.57–1.00)
Globulin, Total: 2.7 g/dL (ref 1.5–4.5)
Glucose: 88 mg/dL (ref 70–99)
Potassium: 4.8 mmol/L (ref 3.5–5.2)
Sodium: 139 mmol/L (ref 134–144)
Total Protein: 7.2 g/dL (ref 6.0–8.5)
eGFR: 67 mL/min/1.73 (ref >59)

## 2024-01-31 LAB — IRON,TIBC AND FERRITIN PANEL
Ferritin: 99 ng/mL (ref 15–150)
Iron Saturation: 37 % (ref 15–55)
Iron: 129 ug/dL (ref 27–159)
Total Iron Binding Capacity: 351 ug/dL (ref 250–450)
UIBC: 222 ug/dL (ref 131–425)

## 2024-01-31 LAB — VITAMIN D 25 HYDROXY (VIT D DEFICIENCY, FRACTURES): Vit D, 25-Hydroxy: 35.6 ng/mL (ref 30.0–100.0)

## 2024-01-31 LAB — FIBRINOGEN: Fibrinogen: 314 mg/dL (ref 193–507)

## 2024-02-01 ENCOUNTER — Ambulatory Visit: Payer: Self-pay | Admitting: Physician Assistant

## 2024-02-01 NOTE — Progress Notes (Signed)
 Pam,   LDL improved!  Vitamin D  low normal. I would increase what you are taking by 1000 units.  Thyroid  looks great.  Labs for bruising all normal.

## 2024-02-04 DIAGNOSIS — R233 Spontaneous ecchymoses: Secondary | ICD-10-CM | POA: Insufficient documentation

## 2024-02-04 DIAGNOSIS — Z91013 Allergy to seafood: Secondary | ICD-10-CM | POA: Insufficient documentation

## 2024-02-04 NOTE — Progress Notes (Signed)
 Complete physical exam  Patient: Heidi Carney   DOB: April 30, 1965   57 y.o. Female  MRN: [id number]  Subjective:    Chief Complaint  Patient presents with   Medical Management of Chronic Issues   .SABRADiscussed the use of AI scribe software for clinical note transcription with the patient, who gave verbal consent to proceed.  History of Present Illness Heidi Carney is a 58 year old female who presents for an annual physical exam.  Thyroid  function and medication management - Alternates between 75 mcg and 88 mcg doses of thyroid  medication - Due for thyroid  function testing  Blood pressure monitoring - Blood pressure readings are typically improved when checked at rheumatology appointments  Sleep disturbance - Experiences nocturnal awakenings - Warm showers facilitate return to sleep - Prefers to avoid additional pharmacologic sleep aids  Mood and mental health - Mood is stable - No current mood disturbances - Acknowledges occasional imperfection but no significant mood issues  Hematologic symptoms - Occasional bruising attributed to a recent bug bite, which has resolved  Gastrointestinal function - Bowel movements are regular with the use of prunes  Physical activity and energy levels - Walks daily - 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 0  Number falls in past yr: 0  Injury with Fall? 0     Most recent depression screenings:    01/30/2024    9:25  AM 07/31/2023    9:41 AM  PHQ 2/9 Scores  PHQ - 2 Score 4 0  PHQ- 9 Score 5 2    Vision:Within last year and Dental: No current dental problems and Receives regular dental care  Patient Active Problem List   Diagnosis Date Noted   Easy bruising 02/04/2024   Shrimp allergy 02/04/2024   Dysfunction of both eustachian tubes 04/24/2023   Middle ear effusion, bilateral 04/24/2023   Conductive hearing loss of right ear with unrestricted hearing of left ear 04/24/2023   Muscle cramp 07/31/2022   Vaginal dryness 07/31/2022   Osteopenia 08/03/2021   Sulfite allergy 05/21/2020   Adverse reaction to food, subsequent encounter 03/30/2020   Haglund's deformity of left heel 02/06/2020   Stress 02/06/2020   Itching 02/06/2020   Myopia with presbyopia of both eyes 05/17/2018   Sjogren's syndrome with keratoconjunctivitis sicca 04/30/2018   Family history of psoriatic arthritis 04/08/2018   Family history of morphea 04/08/2018   Allergic urticaria 02/06/2018   Angioedema 02/06/2018   Food intolerance 02/06/2018   Chronic rhinitis 02/06/2018   Increased pressure in the eye, left 01/02/2018   Colon polyp 12/18/2017   Grade I internal hemorrhoids 12/18/2017   Bouchard's nodes 12/17/2017   Joint stiffness 12/17/2017   Primary osteoarthritis of both hands 12/17/2017   Surgical 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 replacement therapy 12/17/2017   Multiple food allergies 07/12/2017   Nausea 07/07/2017   Intractable cyclical vomiting with nausea 07/07/2017   Diarrhea 07/07/2017   Abdominal cramping 07/07/2017   Allergic reaction 07/07/2017  Ovarian mass 05/31/2017   Grade II hemorrhoids 04/05/2017   Change in stool 02/12/2017   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   Left lower quadrant pain 01/24/2017   Slow transit constipation 01/24/2017   Right ovarian cyst 01/24/2017   Essential hypertension 01/09/2017   Migraine headache without aura 07/10/2016   Hair loss 11/16/2015   RLS (restless legs syndrome)  11/16/2015   B12 deficiency 11/16/2015   Vitamin D  deficiency 11/16/2015   Perimenopausal symptoms 09/13/2015   Overweight 03/17/2015   Seborrheic dermatitis 03/10/2015   GAD (generalized anxiety disorder) 01/26/2015   Elevated blood pressure reading 01/26/2015   Thyroid  activity decreased 01/26/2015   Past Medical History:  Diagnosis Date   Anxiety    Arthritis    H/O total vaginal hysterectomy 05/2017   Hypothyroidism    Sjogren's disease    Past Surgical History:  Procedure Laterality Date   ABDOMINAL HYSTERECTOMY  2019   including ovaries   CESAREAN SECTION  2012   EYE SURGERY     LAPAROSCOPIC TOTAL HYSTERECTOMY  05/2017   MOHS SURGERY  03/08/2022   forehead   Family History  Problem Relation Age of Onset   Diabetes Father    Cancer Father        prostate CA   Stroke Father    Glaucoma Mother    Arthritis Mother    Asthma Sister    Psoriasis Sister        psoriatic arthritis   Arthritis Sister    Scleroderma Sister    Eczema Sister    Asthma Sister    Healthy Daughter    Angioedema Neg Hx    Immunodeficiency Neg Hx    Urticaria Neg Hx    Allergies  Allergen Reactions   Shrimp [Shellfish Allergy]     Lip and tongue swelling.    Celexa  [Citalopram  Hydrobromide]     Made feel anxiety worse   Elemental Sulfur     Possible reaction with lip tingling/SOB/itching   Sulfur Diarrhea    Possible reaction with lip tingling/SOB/itching, vomiting   Macrobid  [Nitrofurantoin ]     Bloated/itchy like a sulfite reaction   Sulfites    Synthroid  [Levothyroxine  Sodium]     Facial itching   Z-Pak [Azithromycin ]     Interaction with plaquenil    Shrimp Extract Diarrhea      Patient Care Team: Heidi Carney L, PA-C as PCP - General (Family Medicine)   Outpatient Medications Prior to Visit  Medication Sig   cyanocobalamin  (VITAMIN B12) 1000 MCG/ML injection INJECT 1 ML (1,000 MCG) INTRAMUSCULARLY EVERY 30 DAYS   diclofenac  sodium (VOLTAREN ) 1 % GEL Apply 4 g  topically 4 (four) times daily. To affected joint.   estradiol  (ESTRACE ) 0.1 MG/GM vaginal cream 1 GRAM VAGINALLY EVERY NIGHT FOR 2 WEEKS AND THEN 2 TIMES WEEKLY.   fluticasone  (FLONASE ) 50 MCG/ACT nasal spray SPRAY 2 SPRAYS INTO EACH NOSTRIL EVERY DAY   hydroxychloroquine  (PLAQUENIL ) 200 MG tablet TAKE 1 TABLET BY MOUTH TWICE A DAY MONDAY THROUGH FRIDAY   ipratropium (ATROVENT ) 0.06 % nasal spray Place 2 sprays into both nostrils 4 (four) times daily. As needed for runny nose / postnasal drip   MOLYBDENUM PO Take 500 mcg by mouth in the morning, at noon, and at bedtime.   nystatin -triamcinolone  ointment (MYCOLOG) Apply 1 Application topically 2 (two) times daily.   TIROSINT  75 MCG CAPS TAKE ONE CAPSULE BY MOUTH IN THE MORNING THREE DAYS PER WEEK no MONDAY, WEDNESDAY, FRIDAY   valACYclovir (VALTREX) 500  MG tablet as needed.   venlafaxine  XR (EFFEXOR -XR) 150 MG 24 hr capsule Take 1 capsule (150 mg total) by mouth daily with breakfast.   VITAMIN D  PO    [DISCONTINUED] EPINEPHrine  (AUVI-Q ) 0.3 mg/0.3 mL IJ SOAJ injection Use as directed for severe allergic reactions.   [DISCONTINUED] Pseudoephedrine-Ibuprofen 30-200 MG TABS Take by mouth.   [DISCONTINUED] TIROSINT  88 MCG CAPS Take one tablet Sunday, Tuesday, Thursday and Saturday.   [DISCONTINUED] amoxicillin -clavulanate (AUGMENTIN ) 875-125 MG tablet Take 1 tablet by mouth 2 (two) times daily.   [DISCONTINUED] promethazine -dextromethorphan (PROMETHAZINE -DM) 6.25-15 MG/5ML syrup Take 5 mLs by mouth 4 (four) times daily as needed.   No facility-administered medications prior to visit.    Review of Systems  All other systems reviewed and are negative.         Objective:     BP 118/70   Pulse 87   Ht 5' 4 (1.626 m)   Wt 163 lb (73.9 kg)   LMP 01/21/2015   SpO2 99%   BMI 27.98 kg/m  BP Readings from Last 3 Encounters:  01/30/24 118/70  12/19/23 124/82  12/17/23 (!) 145/90   Wt Readings from Last 3 Encounters:  01/30/24 163 lb  (73.9 kg)  12/19/23 166 lb (75.3 kg)  12/17/23 163 lb (73.9 kg)      Physical Exam  BP 118/70   Pulse 87   Ht 5' 4 (1.626 m)   Wt 163 lb (73.9 kg)   LMP 01/21/2015   SpO2 99%   BMI 27.98 kg/m   General Appearance:    Alert, cooperative, no distress, appears stated age  Head:    Normocephalic, without obvious abnormality, atraumatic  Eyes:    PERRL, conjunctiva/corneas clear, EOM's intact, fundi    benign, both eyes  Ears:    Normal TM's and external ear canals, both ears  Nose:   Nares normal, septum midline, mucosa normal, no drainage    or sinus tenderness  Throat:   Lips, mucosa, and tongue normal; teeth and gums normal  Neck:   Supple, symmetrical, trachea midline, no adenopathy;    thyroid :  no enlargement/tenderness/nodules; no carotid   bruit or JVD  Back:     Symmetric, no curvature, ROM normal, no CVA tenderness  Lungs:     Clear to auscultation bilaterally, respirations unlabored  Chest Wall:    No tenderness or deformity   Heart:    Regular rate and rhythm, S1 and S2 normal, no murmur, rub   or gallop     Abdomen:     Soft, non-tender, bowel sounds active all four quadrants,    no masses, no organomegaly        Extremities:   Extremities normal, atraumatic, no cyanosis or edema  Pulses:   2+ and symmetric all extremities  Skin:   Skin color, texture, turgor normal, no rashes or lesions  Lymph nodes:   Cervical, supraclavicular, and axillary nodes normal  Neurologic:   CNII-XII intact, normal strength, sensation and reflexes    throughout       Assessment & Plan:    Routine Health Maintenance and Physical Exam  Immunization History  Administered Date(s) Administered   Influenza Inj Mdck Quad Pf 01/08/2019   Influenza, Quadrivalent, Recombinant, Inj, Pf 12/21/2016, 03/15/2018   Influenza,inj,Quad PF,6+ Mos 03/17/2015, 02/02/2016   PFIZER(Purple Top)SARS-COV-2 Vaccination 05/30/2019, 06/27/2019, 01/09/2020   Tdap 07/27/2021    Health Maintenance   Topic Date Due   Influenza Vaccine  07/01/2024 (Originally 11/02/2023)   Pneumococcal  Vaccine: 50+ Years (1 of 1 - PCV) 01/29/2025 (Originally 07/18/2017)   Hepatitis B Vaccines 19-59 Average Risk (1 of 3 - 19+ 3-dose series) 01/29/2025 (Originally 07/19/1986)   Mammogram  01/29/2025 (Originally 11/17/2023)   HIV Screening  01/29/2025 (Originally 07/19/1982)   Colonoscopy  11/01/2027   DTaP/Tdap/Td (2 - Td or Tdap) 07/28/2031   Hepatitis C Screening  Completed   HPV VACCINES  Aged Out   Meningococcal B Vaccine  Aged Out   COVID-19 Vaccine  Discontinued   Zoster Vaccines- Shingrix  Discontinued    Discussed health benefits of physical activity, and encouraged her to engage in regular exercise appropriate for her age and condition. SABRADebara Lango was seen today for medical management of chronic issues.  Diagnoses and all orders for this visit:  Encounter for annual physical exam -     CBC with Differential/Platelet -     CMP14+EGFR -     Lipid panel -     VITAMIN D  25 Hydroxy (Vit-D Deficiency, Fractures) -     TSH + free T4 -     Fe+TIBC+Fer -     B12 and Folate Panel -     PT and PTT -     Fibrinogen  Easy bruising -     CBC with Differential/Platelet -     CMP14+EGFR -     Lipid panel -     VITAMIN D  25 Hydroxy (Vit-D Deficiency, Fractures) -     TSH + free T4 -     Fe+TIBC+Fer -     B12 and Folate Panel -     PT and PTT -     Fibrinogen  GAD (generalized anxiety disorder)  Acquired hypothyroidism  Vitamin D  deficiency -     VITAMIN D  25 Hydroxy (Vit-D Deficiency, Fractures)  B12 deficiency -     B12 and Folate Panel  Essential hypertension -     CMP14+EGFR  Sjogren's syndrome with keratoconjunctivitis sicca  Shrimp allergy -     EPINEPHrine  (AUVI-Q ) 0.3 mg/0.3 mL IJ SOAJ injection; Use as directed for severe allergic reactions.   Assessment & Plan Adult Wellness Visit Routine wellness visit with focus on health maintenance. Declined flu vaccine. Mammogram  scheduled. Colonoscopy current. Discussed pneumonia booster due to age and conditions. - Consider pneumonia booster vaccination. - Continue with scheduled mammogram in January.  Hypertension Blood pressure elevated, likely due to appointment anxiety. Typically well-controlled elsewhere. - Recheck blood pressure.  Hypothyroidism Managed with alternating levothyroxine  doses. Thyroid  function test due. - Order thyroid  function test.  Bruising, easy Bruising likely due to medications and age. Discussed increased bruising with age and medications. - Order platelet count.  General Health Maintenance Emphasized regular dental visits and EpiPen  refill. Will assess vitamin D , B12, and hemoglobin for health and energy. - Refill EpiPen . - Check vitamin D , B12, and hemoglobin levels.     Return in about 6 months (around 07/30/2024).     Alanah Sakuma, PA-C

## 2024-03-23 ENCOUNTER — Other Ambulatory Visit: Payer: Self-pay | Admitting: Physician Assistant

## 2024-03-24 NOTE — Telephone Encounter (Signed)
 Last Fill: 01/23/2024  Eye exam: 06/28/2023 Normal    Labs: 01/30/2024 MCV 104 MCH 34.9 Rest of CBC and CMP WNL  Next Visit: 05/28/2024  Last Visit: 12/19/2023  DX: Sjogren's syndrome with keratoconjunctivitis sicca   Current Dose per office note 12/19/2023: Plaquenil  200 mg 1 tablet by mouth twice daily Monday through Friday.   Okay to refill Plaquenil ?

## 2024-04-17 ENCOUNTER — Encounter: Payer: Self-pay | Admitting: Physician Assistant

## 2024-04-17 DIAGNOSIS — Z780 Asymptomatic menopausal state: Secondary | ICD-10-CM

## 2024-04-17 DIAGNOSIS — Z13820 Encounter for screening for osteoporosis: Secondary | ICD-10-CM

## 2024-04-17 NOTE — Telephone Encounter (Signed)
 Referral pended for provider's review.

## 2024-04-21 ENCOUNTER — Other Ambulatory Visit: Payer: Self-pay | Admitting: Physician Assistant

## 2024-04-21 DIAGNOSIS — E039 Hypothyroidism, unspecified: Secondary | ICD-10-CM

## 2024-05-28 ENCOUNTER — Ambulatory Visit: Admitting: Rheumatology

## 2024-06-11 ENCOUNTER — Ambulatory Visit: Admitting: Physician Assistant

## 2024-07-30 ENCOUNTER — Ambulatory Visit: Admitting: Physician Assistant
